# Patient Record
Sex: Female | Born: 1950
Health system: Southern US, Community
[De-identification: ages and names within clinical notes are randomized; demographics above are authoritative.]

## PROBLEM LIST (undated history)

## (undated) DIAGNOSIS — H269 Unspecified cataract: Secondary | ICD-10-CM

## (undated) DIAGNOSIS — I10 Essential (primary) hypertension: Secondary | ICD-10-CM

## (undated) DIAGNOSIS — F419 Anxiety disorder, unspecified: Secondary | ICD-10-CM

## (undated) DIAGNOSIS — E785 Hyperlipidemia, unspecified: Secondary | ICD-10-CM

## (undated) DIAGNOSIS — K219 Gastro-esophageal reflux disease without esophagitis: Secondary | ICD-10-CM

## (undated) HISTORY — PX: FRACTURE SURGERY: SHX138

## (undated) HISTORY — DX: Gastro-esophageal reflux disease without esophagitis: K21.9

## (undated) HISTORY — PX: APPENDECTOMY: SHX54

## (undated) HISTORY — DX: Unspecified cataract: H26.9

## (undated) HISTORY — DX: Anxiety disorder, unspecified: F41.9

---

## 2015-07-30 DIAGNOSIS — Z1321 Encounter for screening for nutritional disorder: Secondary | ICD-10-CM | POA: Diagnosis not present

## 2015-07-30 DIAGNOSIS — Z13220 Encounter for screening for lipoid disorders: Secondary | ICD-10-CM | POA: Diagnosis not present

## 2015-07-30 DIAGNOSIS — Z136 Encounter for screening for cardiovascular disorders: Secondary | ICD-10-CM | POA: Diagnosis not present

## 2015-07-30 DIAGNOSIS — Z1329 Encounter for screening for other suspected endocrine disorder: Secondary | ICD-10-CM | POA: Diagnosis not present

## 2015-07-31 DIAGNOSIS — R7989 Other specified abnormal findings of blood chemistry: Secondary | ICD-10-CM | POA: Diagnosis not present

## 2015-08-14 DIAGNOSIS — R7989 Other specified abnormal findings of blood chemistry: Secondary | ICD-10-CM | POA: Diagnosis not present

## 2015-09-24 DIAGNOSIS — H2513 Age-related nuclear cataract, bilateral: Secondary | ICD-10-CM | POA: Diagnosis not present

## 2015-09-24 DIAGNOSIS — H401131 Primary open-angle glaucoma, bilateral, mild stage: Secondary | ICD-10-CM | POA: Diagnosis not present

## 2015-10-31 DIAGNOSIS — R7989 Other specified abnormal findings of blood chemistry: Secondary | ICD-10-CM | POA: Diagnosis not present

## 2015-12-19 DIAGNOSIS — Z01419 Encounter for gynecological examination (general) (routine) without abnormal findings: Secondary | ICD-10-CM | POA: Diagnosis not present

## 2015-12-19 DIAGNOSIS — F4323 Adjustment disorder with mixed anxiety and depressed mood: Secondary | ICD-10-CM | POA: Diagnosis not present

## 2015-12-19 DIAGNOSIS — Z779 Other contact with and (suspected) exposures hazardous to health: Secondary | ICD-10-CM | POA: Diagnosis not present

## 2016-01-01 DIAGNOSIS — E559 Vitamin D deficiency, unspecified: Secondary | ICD-10-CM | POA: Diagnosis not present

## 2016-01-27 DIAGNOSIS — Z1231 Encounter for screening mammogram for malignant neoplasm of breast: Secondary | ICD-10-CM | POA: Diagnosis not present

## 2016-02-05 DIAGNOSIS — H2513 Age-related nuclear cataract, bilateral: Secondary | ICD-10-CM | POA: Diagnosis not present

## 2016-02-05 DIAGNOSIS — H35039 Hypertensive retinopathy, unspecified eye: Secondary | ICD-10-CM | POA: Diagnosis not present

## 2016-02-05 DIAGNOSIS — H401130 Primary open-angle glaucoma, bilateral, stage unspecified: Secondary | ICD-10-CM | POA: Diagnosis not present

## 2016-05-19 DIAGNOSIS — F4323 Adjustment disorder with mixed anxiety and depressed mood: Secondary | ICD-10-CM | POA: Diagnosis not present

## 2016-07-17 ENCOUNTER — Emergency Department: Payer: PPO

## 2016-07-17 ENCOUNTER — Emergency Department
Admission: EM | Admit: 2016-07-17 | Discharge: 2016-07-17 | Disposition: A | Discharge status: Home / Self Care | Payer: PPO | Attending: Emergency Medicine | Admitting: Emergency Medicine

## 2016-07-17 ENCOUNTER — Encounter: Payer: Self-pay | Admitting: Emergency Medicine

## 2016-07-17 DIAGNOSIS — Y9289 Other specified places as the place of occurrence of the external cause: Secondary | ICD-10-CM | POA: Insufficient documentation

## 2016-07-17 DIAGNOSIS — F17210 Nicotine dependence, cigarettes, uncomplicated: Secondary | ICD-10-CM | POA: Insufficient documentation

## 2016-07-17 DIAGNOSIS — W010XXA Fall on same level from slipping, tripping and stumbling without subsequent striking against object, initial encounter: Secondary | ICD-10-CM | POA: Insufficient documentation

## 2016-07-17 DIAGNOSIS — S82842A Displaced bimalleolar fracture of left lower leg, initial encounter for closed fracture: Secondary | ICD-10-CM

## 2016-07-17 DIAGNOSIS — Y939 Activity, unspecified: Secondary | ICD-10-CM | POA: Insufficient documentation

## 2016-07-17 DIAGNOSIS — S99912A Unspecified injury of left ankle, initial encounter: Secondary | ICD-10-CM | POA: Diagnosis not present

## 2016-07-17 DIAGNOSIS — S8252XA Displaced fracture of medial malleolus of left tibia, initial encounter for closed fracture: Secondary | ICD-10-CM | POA: Diagnosis not present

## 2016-07-17 DIAGNOSIS — Y999 Unspecified external cause status: Secondary | ICD-10-CM | POA: Diagnosis not present

## 2016-07-17 HISTORY — DX: Hyperlipidemia, unspecified: E78.5

## 2016-07-17 MED ORDER — ONDANSETRON 4 MG PO TBDP: 4.0000 mg | ORAL_TABLET | Freq: Three times a day (TID) | ORAL | 0 refills | Status: DC | PRN

## 2016-07-17 MED ORDER — TRAMADOL HCL 50 MG PO TABS
50.0000 mg | ORAL_TABLET | Freq: Once | ORAL | Status: AC
  Administered 2016-07-17: 50 mg via ORAL

## 2016-07-17 MED ORDER — HYDROCODONE-ACETAMINOPHEN 5-325 MG PO TABS: 1.0000 | ORAL_TABLET | Freq: Four times a day (QID) | ORAL | 0 refills | Status: DC | PRN

## 2016-07-17 MED ORDER — TRAMADOL HCL 50 MG PO TABS
ORAL_TABLET | ORAL | Status: AC
  Filled 2016-07-17: qty 1

## 2016-07-17 NOTE — Discharge Instructions (Signed)
Your exam and x-ray reveals a fracture to the ankle. You are being placed in a splint for support. You should get around using the walker and without putting any weight on your broken ankle. Take the pain medicine as needed. Rest with the foot elevated and apply ice packs over the splint. Your last (light) meal should be before 8 am on Monday, May 13th. Call Dr. Elsie StainJames Bower's office to determine the time to report for office visit and surgery. Return to the ED anytime over the weekend for increased pain, swelling, or re-injury.

## 2016-07-17 NOTE — ED Triage Notes (Signed)
Pt states that while standing this evening her right foot tripped up and she fell on the left ankle. Pt denies LOC or head trauma of any kind. Pt is in NAD at this time.

## 2016-07-17 NOTE — ED Notes (Signed)
See triage note, pt reports she slipped in the kitchen and developed left ankle pain. Pt reports not being able to bear weight to her foot. Pulses intact. Pt denies other injuries or LOC.

## 2016-07-17 NOTE — ED Provider Notes (Signed)
[facility] Emergency Department Provider Note ____________________________________________  Time seen: 2037  I have reviewed the triage vital signs and the nursing notes.  HISTORY  Chief Complaint  Ankle Pain  HPI Brandy Parker is a 66 y.o. female Presents to the ED for evaluation of injury and disability to her left ankle.Patient describes standing on her right foot when her left foot slipped on the floor, causing her to land on the outside of her left ankle and foot. Since that time he's had immediate pain, swelling, and disability. Patient admitted to bear weight through her left ankle. She denies any other injury at this time.  Past Medical History:  Diagnosis Date  . Hyperlipemia     There are no active problems to display for this patient.  History reviewed. No pertinent surgical history.  Prior to Admission medications   Medication Sig Start Date End Date Taking? Authorizing Provider  HYDROcodone-acetaminophen (NORCO) 5-325 MG tablet Take 1 tablet by mouth every 6 (six) hours as needed. 07/17/16   Bernyce Brimley, Charlesetta Ivory, PA-C  ondansetron (ZOFRAN ODT) 4 MG disintegrating tablet Take 1 tablet (4 mg total) by mouth every 8 (eight) hours as needed for nausea or vomiting. 07/17/16   Maysen Sudol, Charlesetta Ivory, PA-C    Allergies Sulfa antibiotics  No family history on file.  Social History Social History  Substance Use Topics  . Smoking status: Current Every Day Smoker    Packs/day: 0.50  . Smokeless tobacco: Never Used  . Alcohol use No    Review of Systems  Constitutional: Negative for fever. Cardiovascular: Negative for chest pain. Respiratory: Negative for shortness of breath. Gastrointestinal: Negative for abdominal pain, vomiting and diarrhea. Musculoskeletal: Negative for back pain. Left ankle pain as above. Skin: Negative for rash. Neurological: Negative for headaches, focal weakness or  numbness. ____________________________________________  PHYSICAL EXAM:  VITAL SIGNS: ED Triage Vitals  Enc Vitals Group     BP 07/17/16 1941 (!) 148/57     Pulse Rate 07/17/16 1941 76     Resp 07/17/16 1941 18     Temp 07/17/16 1941 98.5 F (36.9 C)     Temp Source 07/17/16 1941 Oral     SpO2 07/17/16 1941 100 %     Weight 07/17/16 1942 115 lb (52.2 kg)     Height 07/17/16 1942 5\' 2"  (1.575 m)     Head Circumference --      Peak Flow --      Pain Score 07/17/16 1940 8     Pain Loc --      Pain Edu? --      Excl. in GC? --     Constitutional: Alert and oriented. Well appearing and in no distress. Head: Normocephalic and atraumatic. Cardiovascular: Normal rate, regular rhythm. Normal distal pulses. Respiratory: Normal respiratory effort. No wheezes/rales/rhonchi. Musculoskeletal: Left ankle with edema and subtle deformity noted to the lateral malleolus. Patient with normal ankle flexion extension range. No Local tenderness is noted. Nontender with normal range of motion in all extremities.  Neurologic:  Normal gait without ataxia. Normal speech and language. No gross focal neurologic deficits are appreciated. Skin:  Skin is warm, dry and intact. No rash noted. ____________________________________________   RADIOLOGY  Left Ankle IMPRESSION: Fractures of the medial and posterior malleoli with medial subluxation of the distal tibia relative to the talus.  I, Twila Rappa, Charlesetta Ivory, personally viewed and evaluated these images (plain radiographs) as part of my medical decision making, as well as  reviewing the written report by the radiologist. ____________________________________________  PROCEDURES  Ultram 50 mg PO OCL stirrup splint walker ____________________________________________  INITIAL IMPRESSION / ASSESSMENT AND PLAN / ED COURSE   ----------------------------------------- 8:59 PM on 07/17/2016 ----------------------------------------- Spoke with Dr. Cassell SmilesJames  Bowers (ortho). He will have us splint the patient and have her call on Monday morning to set a surgery time. She is to be NPO by 8 am on Monday morning.   Patient with a closed bimalleolar fracture of the left ankle with initial fracture management. She will be referred to Dr. Cassell SmilesJames Bowers who is planning on taking the patient to the OR on Monday for surgical intervention. Patient is discharged with a prescription for hydrocodone and Zofran to dose as directed. She is also provided with a walker to ambulate with nonweightbearing gait. She will rest, ice, elevated the foot and follow-up with ortho as planned. ____________________________________________  FINAL CLINICAL IMPRESSION(S) / ED DIAGNOSES  Final diagnoses:  Bimalleolar ankle fracture, left, closed, initial encounter      Lissa HoardMenshew, Evadna Donaghy V Bacon, PA-C 07/17/16 2152    Emily FilbertWilliams, Jonathan E, MD 07/17/16 2153

## 2016-07-20 ENCOUNTER — Encounter: Payer: Self-pay | Admitting: *Deleted

## 2016-07-20 ENCOUNTER — Ambulatory Visit
Admission: RE | Admit: 2016-07-20 | Discharge: 2016-07-20 | Disposition: A | Discharge status: Home / Self Care | Payer: PPO | Source: Ambulatory Visit | Attending: Orthopedic Surgery | Admitting: Orthopedic Surgery

## 2016-07-20 ENCOUNTER — Ambulatory Visit: Payer: PPO | Admitting: Anesthesiology

## 2016-07-20 ENCOUNTER — Encounter
Admission: RE | Disposition: A | Discharge status: Home / Self Care | Payer: Self-pay | Source: Ambulatory Visit | Attending: Orthopedic Surgery

## 2016-07-20 DIAGNOSIS — S82832A Other fracture of upper and lower end of left fibula, initial encounter for closed fracture: Secondary | ICD-10-CM | POA: Insufficient documentation

## 2016-07-20 DIAGNOSIS — M25572 Pain in left ankle and joints of left foot: Secondary | ICD-10-CM | POA: Diagnosis not present

## 2016-07-20 DIAGNOSIS — X58XXXA Exposure to other specified factors, initial encounter: Secondary | ICD-10-CM | POA: Diagnosis not present

## 2016-07-20 DIAGNOSIS — F17210 Nicotine dependence, cigarettes, uncomplicated: Secondary | ICD-10-CM | POA: Diagnosis not present

## 2016-07-20 DIAGNOSIS — I10 Essential (primary) hypertension: Secondary | ICD-10-CM | POA: Insufficient documentation

## 2016-07-20 DIAGNOSIS — S8262XA Displaced fracture of lateral malleolus of left fibula, initial encounter for closed fracture: Secondary | ICD-10-CM | POA: Diagnosis not present

## 2016-07-20 DIAGNOSIS — S8292XA Unspecified fracture of left lower leg, initial encounter for closed fracture: Secondary | ICD-10-CM | POA: Diagnosis not present

## 2016-07-20 DIAGNOSIS — G8918 Other acute postprocedural pain: Secondary | ICD-10-CM | POA: Diagnosis not present

## 2016-07-20 HISTORY — PX: ORIF ANKLE FRACTURE: SHX5408

## 2016-07-20 SURGERY — OPEN REDUCTION INTERNAL FIXATION (ORIF) ANKLE FRACTURE
Anesthesia: General | Laterality: Left | Wound class: Clean

## 2016-07-20 MED ORDER — ONDANSETRON HCL 4 MG/2ML IJ SOLN
INTRAMUSCULAR | Status: DC | PRN
  Administered 2016-07-20: 4 mg via INTRAVENOUS

## 2016-07-20 MED ORDER — CEFAZOLIN SODIUM-DEXTROSE 2-4 GM/100ML-% IV SOLN
INTRAVENOUS | Status: AC
  Filled 2016-07-20: qty 100

## 2016-07-20 MED ORDER — FENTANYL CITRATE (PF) 100 MCG/2ML IJ SOLN
INTRAMUSCULAR | Status: AC
  Filled 2016-07-20: qty 2

## 2016-07-20 MED ORDER — CEFAZOLIN (ANCEF) 1 G IV SOLR
2.0000 g | INTRAVENOUS | Status: AC
  Administered 2016-07-20: 2 g

## 2016-07-20 MED ORDER — LIDOCAINE HCL (PF) 1 % IJ SOLN
INTRAMUSCULAR | Status: AC
  Filled 2016-07-20: qty 5

## 2016-07-20 MED ORDER — PHENYLEPHRINE HCL 10 MG/ML IJ SOLN
INTRAMUSCULAR | Status: DC | PRN
  Administered 2016-07-20: 50 ug via INTRAVENOUS
  Administered 2016-07-20 (×3): 100 ug via INTRAVENOUS

## 2016-07-20 MED ORDER — MIDAZOLAM HCL 2 MG/2ML IJ SOLN: INTRAMUSCULAR | Status: DC | PRN

## 2016-07-20 MED ORDER — ONDANSETRON HCL 4 MG/2ML IJ SOLN: 4.0000 mg | Freq: Once | INTRAMUSCULAR | Status: DC | PRN

## 2016-07-20 MED ORDER — SENNOSIDES-DOCUSATE SODIUM 8.6-50 MG PO TABS: 2.0000 | ORAL_TABLET | Freq: Two times a day (BID) | ORAL | 0 refills | Status: AC

## 2016-07-20 MED ORDER — SODIUM CHLORIDE 0.9 % IJ SOLN
INTRAMUSCULAR | Status: AC
  Filled 2016-07-20: qty 50

## 2016-07-20 MED ORDER — LACTATED RINGERS IV SOLN
INTRAVENOUS | Status: DC
  Administered 2016-07-20: 13:00:00 via INTRAVENOUS

## 2016-07-20 MED ORDER — ACETAMINOPHEN 10 MG/ML IV SOLN
INTRAVENOUS | Status: AC
  Filled 2016-07-20: qty 100

## 2016-07-20 MED ORDER — BACITRACIN 50000 UNITS IM SOLR
INTRAMUSCULAR | Status: AC
  Filled 2016-07-20: qty 1

## 2016-07-20 MED ORDER — LIDOCAINE HCL (CARDIAC) 20 MG/ML IV SOLN
INTRAVENOUS | Status: DC | PRN
  Administered 2016-07-20: 80 mg via INTRAVENOUS

## 2016-07-20 MED ORDER — FAMOTIDINE 20 MG PO TABS
ORAL_TABLET | ORAL | Status: AC
  Administered 2016-07-20: 20 mg via ORAL
  Filled 2016-07-20: qty 1

## 2016-07-20 MED ORDER — MIDAZOLAM HCL 2 MG/2ML IJ SOLN
1.0000 mg | Freq: Once | INTRAMUSCULAR | Status: AC
  Administered 2016-07-20: 1 mg via INTRAVENOUS

## 2016-07-20 MED ORDER — KETOROLAC TROMETHAMINE 30 MG/ML IJ SOLN
INTRAMUSCULAR | Status: AC
  Filled 2016-07-20: qty 1

## 2016-07-20 MED ORDER — PROPOFOL 10 MG/ML IV BOLUS
INTRAVENOUS | Status: DC | PRN
  Administered 2016-07-20: 130 mg via INTRAVENOUS

## 2016-07-20 MED ORDER — DEXAMETHASONE SODIUM PHOSPHATE 10 MG/ML IJ SOLN
INTRAMUSCULAR | Status: AC
  Filled 2016-07-20: qty 1

## 2016-07-20 MED ORDER — BUPIVACAINE HCL (PF) 0.5 % IJ SOLN
INTRAMUSCULAR | Status: AC
  Filled 2016-07-20: qty 30

## 2016-07-20 MED ORDER — MIDAZOLAM HCL 2 MG/2ML IJ SOLN
INTRAMUSCULAR | Status: DC | PRN
  Administered 2016-07-20: 1 mg via INTRAVENOUS

## 2016-07-20 MED ORDER — LIDOCAINE HCL (PF) 2 % IJ SOLN
INTRAMUSCULAR | Status: AC
  Filled 2016-07-20: qty 2

## 2016-07-20 MED ORDER — MIDAZOLAM HCL 2 MG/2ML IJ SOLN
INTRAMUSCULAR | Status: AC
  Filled 2016-07-20: qty 2

## 2016-07-20 MED ORDER — FAMOTIDINE 20 MG PO TABS
20.0000 mg | ORAL_TABLET | Freq: Once | ORAL | Status: AC
  Administered 2016-07-20: 20 mg via ORAL

## 2016-07-20 MED ORDER — ROPIVACAINE HCL 5 MG/ML IJ SOLN
INTRAMUSCULAR | Status: AC
  Filled 2016-07-20: qty 30

## 2016-07-20 MED ORDER — FENTANYL CITRATE (PF) 100 MCG/2ML IJ SOLN: 25.0000 ug | INTRAMUSCULAR | Status: DC | PRN

## 2016-07-20 MED ORDER — MIDAZOLAM HCL 2 MG/2ML IJ SOLN
INTRAMUSCULAR | Status: AC
  Administered 2016-07-20: 1 mg via INTRAVENOUS
  Filled 2016-07-20: qty 2

## 2016-07-20 MED ORDER — DEXAMETHASONE SODIUM PHOSPHATE 10 MG/ML IJ SOLN
INTRAMUSCULAR | Status: DC | PRN
  Administered 2016-07-20: 8 mg via INTRAVENOUS

## 2016-07-20 MED ORDER — ONDANSETRON HCL 4 MG/2ML IJ SOLN
INTRAMUSCULAR | Status: AC
  Filled 2016-07-20: qty 2

## 2016-07-20 MED ORDER — FENTANYL CITRATE (PF) 100 MCG/2ML IJ SOLN
INTRAMUSCULAR | Status: DC | PRN
  Administered 2016-07-20: 25 ug via INTRAVENOUS
  Administered 2016-07-20: 50 ug via INTRAVENOUS
  Administered 2016-07-20: 25 ug via INTRAVENOUS

## 2016-07-20 MED ORDER — PROPOFOL 10 MG/ML IV BOLUS
INTRAVENOUS | Status: AC
  Filled 2016-07-20: qty 20

## 2016-07-20 SURGICAL SUPPLY — 48 items
BANDAGE ELASTIC 6 CLIP ST LF (GAUZE/BANDAGES/DRESSINGS) ×3 IMPLANT
BIT DRILL 2.5X110 QC LCP DISP (BIT) ×3 IMPLANT
BIT DRILL QC 3.5X110 (BIT) ×3 IMPLANT
BLADE SURG 10 STRL SS SAFETY (BLADE) ×3 IMPLANT
BLADE SURG 15 STRL SS SAFETY (BLADE) ×9 IMPLANT
BNDG COHESIVE 6X5 TAN STRL LF (GAUZE/BANDAGES/DRESSINGS) IMPLANT
BNDG ESMARK 6X12 TAN STRL LF (GAUZE/BANDAGES/DRESSINGS) ×3 IMPLANT
BRUSH SCRUB EZ  4% CHG (MISCELLANEOUS) ×2
BRUSH SCRUB EZ 4% CHG (MISCELLANEOUS) ×1 IMPLANT
CANISTER SUCT 1200ML W/VALVE (MISCELLANEOUS) ×3 IMPLANT
CHLORAPREP W/TINT 26ML (MISCELLANEOUS) ×3 IMPLANT
DRAPE FLUOR MINI C-ARM 54X84 (DRAPES) ×3 IMPLANT
DRAPE SHEET LG 3/4 BI-LAMINATE (DRAPES) ×3 IMPLANT
ELECT REM PT RETURN 9FT ADLT (ELECTROSURGICAL) ×3
ELECTRODE REM PT RTRN 9FT ADLT (ELECTROSURGICAL) ×1 IMPLANT
GAUZE PETRO XEROFOAM 1X8 (MISCELLANEOUS) ×3 IMPLANT
GLOVE INDICATOR 8.0 STRL GRN (GLOVE) ×9 IMPLANT
GLOVE SURG ORTHO 8.0 STRL STRW (GLOVE) ×6 IMPLANT
GOWN STRL REUS W/ TWL LRG LVL3 (GOWN DISPOSABLE) ×1 IMPLANT
GOWN STRL REUS W/ TWL XL LVL3 (GOWN DISPOSABLE) ×1 IMPLANT
GOWN STRL REUS W/TWL LRG LVL3 (GOWN DISPOSABLE) ×2
GOWN STRL REUS W/TWL XL LVL3 (GOWN DISPOSABLE) ×2
KIT RM TURNOVER STRD PROC AR (KITS) ×3 IMPLANT
NS IRRIG 1000ML POUR BTL (IV SOLUTION) ×3 IMPLANT
PACK EXTREMITY ARMC (MISCELLANEOUS) ×3 IMPLANT
PAD ABD DERMACEA PRESS 5X9 (GAUZE/BANDAGES/DRESSINGS) ×6 IMPLANT
PAD CAST CTTN 4X4 STRL (SOFTGOODS) ×2 IMPLANT
PADDING CAST COTTON 4X4 STRL (SOFTGOODS) ×4
PLATE LCP 3.5 1/3 TUB 7HX81 (Plate) ×3 IMPLANT
SCREW LOCK T15 FT 12X3.5X2.9X (Screw) ×2 IMPLANT
SCREW LOCK T15 FT 14X3.5X2.9X (Screw) ×1 IMPLANT
SCREW LOCK T15 FT 16X3.5X2.9X (Screw) ×2 IMPLANT
SCREW LOCK T15 FT 20X3.5XST (Screw) ×1 IMPLANT
SCREW LOCK T15 FT 24X3.5X2.9X (Screw) IMPLANT
SCREW LOCKING 3.5X12 (Screw) ×4 IMPLANT
SCREW LOCKING 3.5X14 (Screw) ×2 IMPLANT
SCREW LOCKING 3.5X16 (Screw) ×4 IMPLANT
SCREW LOCKING 3.5X20 (Screw) ×2 IMPLANT
SCREW LOCKING 3.5X24 (Screw) IMPLANT
SPLINT CAST 1 STEP 5X30 WHT (MISCELLANEOUS) ×3 IMPLANT
STAPLER SKIN PROX 35W (STAPLE) ×3 IMPLANT
SUT VIC AB 2-0 SH 27 (SUTURE) ×2
SUT VIC AB 2-0 SH 27XBRD (SUTURE) ×1 IMPLANT
SUT VIC AB 3-0 SH 27 (SUTURE) ×2
SUT VIC AB 3-0 SH 27X BRD (SUTURE) ×1 IMPLANT
TAPE SURG TRANSPORE 1 IN (GAUZE/BANDAGES/DRESSINGS) IMPLANT
TAPE SURGICAL TRANSPORE 1 IN (GAUZE/BANDAGES/DRESSINGS)
TOWEL OR 17X26 4PK STRL BLUE (TOWEL DISPOSABLE) ×3 IMPLANT

## 2016-07-20 NOTE — Anesthesia Procedure Notes (Signed)
Procedure Name: LMA Insertion Date/Time: 07/20/2016 3:55 PM Performed by: Henrietta HooverPOPE, Courteney Alderete Pre-anesthesia Checklist: Patient identified, Emergency Drugs available, Suction available, Patient being monitored and Timeout performed Patient Re-evaluated:Patient Re-evaluated prior to inductionOxygen Delivery Method: Circle system utilized Preoxygenation: Pre-oxygenation with 100% oxygen Intubation Type: IV induction Ventilation: Mask ventilation without difficulty LMA: LMA inserted LMA Size: 4.0 Number of attempts: 1 Placement Confirmation: positive ETCO2 Tube secured with: Tape Dental Injury: Teeth and Oropharynx as per pre-operative assessment

## 2016-07-20 NOTE — Anesthesia Procedure Notes (Signed)
Anesthesia Regional Block: Popliteal block   Pre-Anesthetic Checklist: ,, timeout performed, Correct Patient, Correct Site, Correct Laterality, Correct Procedure, Correct Position, site marked, Risks and benefits discussed,  Surgical consent,  Pre-op evaluation,  At surgeon's request and post-op pain management  Laterality: Lower and Left  Prep: chloraprep       Needles:  Injection technique: Single-shot  Needle Type: Echogenic Stimulator Needle     Needle Length: 10cm  Needle Gauge: 20     Additional Needles:   Procedures: ultrasound guided,,,,,,,,  Narrative:  Injection made incrementally with aspirations every 5 mL.  Performed by: Personally  Anesthesiologist: Caedon Bond, Currie ParisJAMES G  Additional Notes: Functioning IV was confirmed and monitors were applied. A time out was performed and site identified.  An echogenic needle was used. Sterile prep,hand hygiene and sterile gloves were used. Minimal sedation used for procedure.   No paresthesia endorsed by patient during the procedure.  Negative aspiration and negative test dose prior to incremental administration of local anesthetic. The patient tolerated the procedure well with no immediate complications.

## 2016-07-20 NOTE — Anesthesia Preprocedure Evaluation (Signed)
Anesthesia Evaluation  Patient identified by MRN, date of birth, ID band Patient awake    Reviewed: Allergy & Precautions, H&P , NPO status , Patient's Chart, lab work & pertinent test results, reviewed documented beta blocker date and time   Airway Mallampati: II  TM Distance: >3 FB Neck ROM: full    Dental no notable dental hx. (+) Teeth Intact   Pulmonary neg pulmonary ROS, Current Smoker,    Pulmonary exam normal breath sounds clear to auscultation       Cardiovascular Exercise Tolerance: Good hypertension, negative cardio ROS Normal cardiovascular exam Rhythm:regular Rate:Normal     Neuro/Psych negative neurological ROS  negative psych ROS   GI/Hepatic negative GI ROS, Neg liver ROS,   Endo/Other  negative endocrine ROSdiabetes  Renal/GU negative Renal ROS  negative genitourinary   Musculoskeletal   Abdominal   Peds  Hematology negative hematology ROS (+)   Anesthesia Other Findings   Reproductive/Obstetrics negative OB ROS                             Anesthesia Physical Anesthesia Plan  ASA: II  Anesthesia Plan: General LMA   Post-op Pain Management:  Regional for Post-op pain   Induction:   Airway Management Planned:   Additional Equipment:   Intra-op Plan:   Post-operative Plan:   Informed Consent: I have reviewed the patients History and Physical, chart, labs and discussed the procedure including the risks, benefits and alternatives for the proposed anesthesia with the patient or authorized representative who has indicated his/her understanding and acceptance.     Plan Discussed with: CRNA  Anesthesia Plan Comments:         Anesthesia Quick Evaluation

## 2016-07-20 NOTE — Anesthesia Postprocedure Evaluation (Signed)
Anesthesia Post Note  Patient: Ane PaymentDebra G Ulbrich  Procedure(s) Performed: Procedure(s) (LRB): OPEN REDUCTION INTERNAL FIXATION (ORIF) ANKLE FRACTURE (Left)  Patient location during evaluation: PACU Anesthesia Type: General Level of consciousness: awake and alert and oriented Pain management: pain level controlled Vital Signs Assessment: post-procedure vital signs reviewed and stable Respiratory status: spontaneous breathing, nonlabored ventilation and respiratory function stable Cardiovascular status: blood pressure returned to baseline and stable Postop Assessment: no signs of nausea or vomiting Anesthetic complications: no     Last Vitals:  Vitals:   07/20/16 1703 07/20/16 1718  BP: (!) 145/71 132/82  Pulse: 84 74  Resp: 11 11  Temp: 36.2 C     Last Pain:  Vitals:   07/20/16 1703  TempSrc:   PainSc: Asleep                 Vinh Sachs

## 2016-07-20 NOTE — Anesthesia Post-op Follow-up Note (Cosign Needed)
Anesthesia QCDR form completed.        

## 2016-07-20 NOTE — H&P (Signed)
The patient has been re-examined, and the chart reviewed, and there have been no interval changes to the documented history and physical.  Plan a left ankle ORIF today. ° °Anesthesia is consulted regarding a peripheral nerve block for post-operative pain. ° °The risks, benefits, and alternatives have been discussed at length, and the patient is willing to proceed.   ° °

## 2016-07-20 NOTE — Transfer of Care (Signed)
Immediate Anesthesia Transfer of Care Note  Patient: Ane PaymentDebra G Longstreth  Procedure(s) Performed: Procedure(s): OPEN REDUCTION INTERNAL FIXATION (ORIF) ANKLE FRACTURE (Left)  Patient Location: PACU  Anesthesia Type:General  Level of Consciousness: sedated  Airway & Oxygen Therapy: Patient Spontanous Breathing and Patient connected to face mask oxygen  Post-op Assessment: Report given to RN and Post -op Vital signs reviewed and stable  Post vital signs: Reviewed and stable  Last Vitals:  Vitals:   07/20/16 1450 07/20/16 1505  BP: (!) 141/71 (!) 149/71  Pulse: 86 93  Resp: 13 15  Temp:      Last Pain:  Vitals:   07/20/16 1505  TempSrc:   PainSc: 0-No pain         Complications: No apparent anesthesia complications

## 2016-07-20 NOTE — Discharge Instructions (Addendum)
Ankle Fracture A fracture is a break in a bone. A cast or splint may be used to protect the ankle and heal the break. Sometimes, surgery is needed. Follow these instructions at home:  Use crutches as told by your doctor. It is very important that you use your crutches correctly.  Do not put weight or pressure on the injured ankle until told by your doctor.  Keep your ankle raised (elevated) when sitting or lying down.  Apply ice to the ankle:  Put ice in a plastic bag.  Place a towel between your cast and the bag.  Leave the ice on for 20 minutes, 2-3 times a day.  If you have a plaster or fiberglass cast:  Do not try to scratch under the cast with any objects.  Check the skin around the cast every day. You may put lotion on red or sore areas.  Keep your cast dry and clean.  If you have a plaster splint:  Wear the splint as told by your doctor.  You can loosen the elastic around the splint if your toes get numb, tingle, or turn cold or blue.  Do not put pressure on any part of your cast or splint. It may break. Rest your plaster splint or cast only on a pillow the first 24 hours until it is fully hardened.  Cover your cast or splint with a plastic bag during showers.  Do not lower your cast or splint into water.  Take medicine as told by your doctor.  Do not drive until your doctor says it is safe.  Follow-up with your doctor as told. It is very important that you go to your follow-up visits. Contact a doctor if: The swelling and discomfort gets worse. Get help right away if:  Your splint or cast breaks.  You continue to have very bad pain.  You have new pain or swelling after your splint or cast was put on.  Your skin or toes below the injured ankle:  Turn blue or gray.  Feel cold, numb, or you cannot feel them.  There is a bad smell or yellowish white fluid (pus) coming from under the splint or cast. This information is not intended to replace advice  given to you by your health care provider. Make sure you discuss any questions you have with your health care provider. Document Released: 12/21/2008 Document Revised: 08/01/2015 Document Reviewed: 09/22/2012 Elsevier Interactive Patient Education  2017 [employer]   AMBULATORY SURGERY  DISCHARGE INSTRUCTIONS   1) The drugs that you were given will stay in your system until tomorrow so for the next 24 hours you should not:  A) Drive an automobile B) Make any legal decisions C) Drink any alcoholic beverage   2) You may resume regular meals tomorrow.  Today it is better to start with liquids and gradually work up to solid foods.  You may eat anything you prefer, but it is better to start with liquids, then soup and crackers, and gradually work up to solid foods.   3) Please notify your doctor immediately if you have any unusual bleeding, trouble breathing, redness and pain at the surgery site, drainage, fever, or pain not relieved by medication.    4) Additional Instructions:   Please contact your physician with any problems or Same Day Surgery at [phone], Monday through Friday 6 am to 4 pm, or [place] at [facility] Main number at [phone].

## 2016-07-20 NOTE — Op Note (Signed)
07/20/2016  PATIENT:  Brandy Parker    PRE-OPERATIVE DIAGNOSIS:  LEFT ANKLE FRACTURE  POST-OPERATIVE DIAGNOSIS:  Same  PROCEDURE:  OPEN REDUCTION INTERNAL FIXATION (ORIF) ANKLE FRACTURE  SURGEON:  Lyndle HerrlichJames R Mancel Lardizabal, MD  ANESTHESIA:   General  TOURNIQUET TIME: 27  MIN  PREOPERATIVE INDICATIONS:  Ane PaymentDebra G Cieslewicz is a  66 y.o. female with a diagnosis of LEFT ANKLE FRACTURE who elected for surgical management to minimize the risk for malunion and nonunion and post-traumatic arthritis.    The risks benefits and alternatives were discussed with the patient preoperatively including but not limited to the risks of infection, bleeding, nerve injury, cardiopulmonary complications, the need for revision surgery, the need for hardware removal, among others, and the patient was willing to proceed.  OPERATIVE IMPLANTS: Synthes 1/3 tubular plate and interfragmentary screw  OPERATIVE PROCEDURE: The patient was brought to the operating room and placed in the supine position. All bony prominences were padded. General anesthesia was administered. The lower extremity was prepped and draped in the usual sterile fashion. The leg was elevated and exsanguinated and the tourniquet was inflated. Time out was performed.   Incision was made over the distal fibula and the fracture was exposed and reduced anatomically with a clamp. A lag screw was placed. I then applied a 1/3 tubular plate and secured it with cortical screws.  I used the Fluoroscan to confirm satisfactory reduction and fixation. This wound was irrigated and closed with vicryl sutures and staples.  The syndesmosis was stressed using live fluoroscopy and found to be stable.   Sponge and needle counts were correct. The wounds were injected with local anesthetic. Sterile gauze was applied followed by a posterior splint. She was awakened and returned to the PACU in stable and satisfactory condition. There were no apparent complications.  Dola ArgyleJames R. Odis LusterBowers, MD

## 2016-07-21 ENCOUNTER — Encounter: Payer: Self-pay | Admitting: Orthopedic Surgery

## 2016-07-29 DIAGNOSIS — S8262XD Displaced fracture of lateral malleolus of left fibula, subsequent encounter for closed fracture with routine healing: Secondary | ICD-10-CM | POA: Diagnosis not present

## 2016-08-31 DIAGNOSIS — S8262XD Displaced fracture of lateral malleolus of left fibula, subsequent encounter for closed fracture with routine healing: Secondary | ICD-10-CM | POA: Diagnosis not present

## 2016-12-29 DIAGNOSIS — Z131 Encounter for screening for diabetes mellitus: Secondary | ICD-10-CM | POA: Diagnosis not present

## 2016-12-29 DIAGNOSIS — Z136 Encounter for screening for cardiovascular disorders: Secondary | ICD-10-CM | POA: Diagnosis not present

## 2016-12-29 DIAGNOSIS — Z1321 Encounter for screening for nutritional disorder: Secondary | ICD-10-CM | POA: Diagnosis not present

## 2016-12-29 DIAGNOSIS — Z1329 Encounter for screening for other suspected endocrine disorder: Secondary | ICD-10-CM | POA: Diagnosis not present

## 2016-12-29 DIAGNOSIS — Z13220 Encounter for screening for lipoid disorders: Secondary | ICD-10-CM | POA: Diagnosis not present

## 2017-02-01 DIAGNOSIS — M85852 Other specified disorders of bone density and structure, left thigh: Secondary | ICD-10-CM | POA: Diagnosis not present

## 2017-02-01 DIAGNOSIS — M8588 Other specified disorders of bone density and structure, other site: Secondary | ICD-10-CM | POA: Diagnosis not present

## 2017-02-01 DIAGNOSIS — Z1231 Encounter for screening mammogram for malignant neoplasm of breast: Secondary | ICD-10-CM | POA: Diagnosis not present

## 2017-02-01 DIAGNOSIS — M8589 Other specified disorders of bone density and structure, multiple sites: Secondary | ICD-10-CM | POA: Diagnosis not present

## 2017-02-01 DIAGNOSIS — Z79890 Hormone replacement therapy (postmenopausal): Secondary | ICD-10-CM | POA: Diagnosis not present

## 2017-02-01 DIAGNOSIS — Z780 Asymptomatic menopausal state: Secondary | ICD-10-CM | POA: Diagnosis not present

## 2017-02-01 DIAGNOSIS — Z13820 Encounter for screening for osteoporosis: Secondary | ICD-10-CM | POA: Diagnosis not present

## 2017-04-26 DIAGNOSIS — H2513 Age-related nuclear cataract, bilateral: Secondary | ICD-10-CM | POA: Diagnosis not present

## 2017-04-26 DIAGNOSIS — H401130 Primary open-angle glaucoma, bilateral, stage unspecified: Secondary | ICD-10-CM | POA: Diagnosis not present

## 2017-04-26 DIAGNOSIS — H35039 Hypertensive retinopathy, unspecified eye: Secondary | ICD-10-CM | POA: Diagnosis not present

## 2017-06-29 DIAGNOSIS — F4323 Adjustment disorder with mixed anxiety and depressed mood: Secondary | ICD-10-CM | POA: Diagnosis not present

## 2017-11-24 DIAGNOSIS — H401131 Primary open-angle glaucoma, bilateral, mild stage: Secondary | ICD-10-CM | POA: Diagnosis not present

## 2017-12-30 DIAGNOSIS — Z131 Encounter for screening for diabetes mellitus: Secondary | ICD-10-CM | POA: Diagnosis not present

## 2017-12-30 DIAGNOSIS — Z1321 Encounter for screening for nutritional disorder: Secondary | ICD-10-CM | POA: Diagnosis not present

## 2017-12-30 DIAGNOSIS — F4323 Adjustment disorder with mixed anxiety and depressed mood: Secondary | ICD-10-CM | POA: Diagnosis not present

## 2017-12-30 DIAGNOSIS — Z13220 Encounter for screening for lipoid disorders: Secondary | ICD-10-CM | POA: Diagnosis not present

## 2017-12-30 DIAGNOSIS — Z136 Encounter for screening for cardiovascular disorders: Secondary | ICD-10-CM | POA: Diagnosis not present

## 2017-12-30 DIAGNOSIS — Z01419 Encounter for gynecological examination (general) (routine) without abnormal findings: Secondary | ICD-10-CM | POA: Diagnosis not present

## 2017-12-30 DIAGNOSIS — Z1329 Encounter for screening for other suspected endocrine disorder: Secondary | ICD-10-CM | POA: Diagnosis not present

## 2018-02-11 DIAGNOSIS — Z1231 Encounter for screening mammogram for malignant neoplasm of breast: Secondary | ICD-10-CM | POA: Diagnosis not present

## 2018-03-03 ENCOUNTER — Other Ambulatory Visit: Payer: Self-pay | Admitting: Obstetrics and Gynecology

## 2018-03-03 DIAGNOSIS — N6489 Other specified disorders of breast: Secondary | ICD-10-CM

## 2018-03-17 ENCOUNTER — Ambulatory Visit
Admission: RE | Admit: 2018-03-17 | Discharge: 2018-03-17 | Disposition: A | Discharge status: Home / Self Care | Payer: PPO | Source: Ambulatory Visit | Attending: Obstetrics and Gynecology | Admitting: Obstetrics and Gynecology

## 2018-03-17 DIAGNOSIS — N6489 Other specified disorders of breast: Secondary | ICD-10-CM | POA: Insufficient documentation

## 2018-03-17 DIAGNOSIS — R922 Inconclusive mammogram: Secondary | ICD-10-CM | POA: Diagnosis not present

## 2018-05-25 DIAGNOSIS — H40019 Open angle with borderline findings, low risk, unspecified eye: Secondary | ICD-10-CM | POA: Diagnosis not present

## 2018-05-25 DIAGNOSIS — H2513 Age-related nuclear cataract, bilateral: Secondary | ICD-10-CM | POA: Diagnosis not present

## 2018-05-25 DIAGNOSIS — H401131 Primary open-angle glaucoma, bilateral, mild stage: Secondary | ICD-10-CM | POA: Diagnosis not present

## 2018-05-25 DIAGNOSIS — H35039 Hypertensive retinopathy, unspecified eye: Secondary | ICD-10-CM | POA: Diagnosis not present

## 2019-02-04 ENCOUNTER — Encounter: Payer: Self-pay | Admitting: Nurse Practitioner

## 2019-02-04 DIAGNOSIS — H2513 Age-related nuclear cataract, bilateral: Secondary | ICD-10-CM | POA: Insufficient documentation

## 2019-02-04 DIAGNOSIS — H401131 Primary open-angle glaucoma, bilateral, mild stage: Secondary | ICD-10-CM | POA: Insufficient documentation

## 2019-02-09 ENCOUNTER — Ambulatory Visit (INDEPENDENT_AMBULATORY_CARE_PROVIDER_SITE_OTHER): Payer: PPO | Admitting: Nurse Practitioner

## 2019-02-09 ENCOUNTER — Encounter: Payer: Self-pay | Admitting: Nurse Practitioner

## 2019-02-09 ENCOUNTER — Other Ambulatory Visit: Payer: Self-pay

## 2019-02-09 VITALS — Temp 98.5°F

## 2019-02-09 DIAGNOSIS — Z7689 Persons encountering health services in other specified circumstances: Secondary | ICD-10-CM | POA: Diagnosis not present

## 2019-02-09 DIAGNOSIS — F411 Generalized anxiety disorder: Secondary | ICD-10-CM

## 2019-02-09 DIAGNOSIS — F339 Major depressive disorder, recurrent, unspecified: Secondary | ICD-10-CM | POA: Insufficient documentation

## 2019-02-09 DIAGNOSIS — E785 Hyperlipidemia, unspecified: Secondary | ICD-10-CM

## 2019-02-09 DIAGNOSIS — Z1231 Encounter for screening mammogram for malignant neoplasm of breast: Secondary | ICD-10-CM

## 2019-02-09 NOTE — Patient Instructions (Signed)

## 2019-02-09 NOTE — Progress Notes (Signed)
New Patient Office Visit  Subjective:  Patient ID: Brandy Parker, female    DOB: 10-30-1950  Age: 68 y.o. MRN: [id number]  CC:  Chief Complaint  Patient presents with  . Establish Care    . This visit was completed via telephone due to the restrictions of the COVID-19 pandemic. All issues as above were discussed and addressed but no physical exam was performed. If it was felt that the patient should be evaluated in the office, they were directed there. The patient verbally consented to this visit. Patient was unable to complete an audio/visual visit due to Technical difficulties,Lack of internet. Due to the catastrophic nature of the COVID-19 pandemic, this visit was done through audio contact only. . Location of the patient: home . Location of the provider: work . Those involved with this call:  . Provider: Aura DialsJolene Chalisa Kobler, DNP . CMA: Wilhemena DurieBrittany Russell, CMA . Front Desk/Registration: Harriet PhoJoliza Johnson  . Time spent on call: 30 minutes on the phone discussing health concerns. 10 minutes total spent in review of patient's record and preparation of their chart.  . I verified patient identity using two factors (patient name and date of birth). Patient consents verbally to being seen via telemedicine visit today.    HPI Brandy Parker presents for new patient visit to establish care.  Introduced to Publishing rights managernurse practitioner role and practice setting.  All questions answered.  Was followed by Dr. Toya SmothersKnowles-Jonas at [facility] for PCP and then recently went for physical and office was closed.  Due for mammogram, got letter that she is due for this.  She does not recall having DEXA.  ANXIETY/STRESS Current medications include Xanax 0.5 MG TID, has only ever taken this at night and often takes 1/2 tablet, and Lexapro 10 MG daily.  Has been on Xanax for a long while per her report, several years.  Has always been ordered TID, but she has never used it this way.  Pt is aware of risks of benzo  medication use to include increased sedation, respiratory suppression, falls, extrapyramidal movements,  dependence and cardiovascular events.  Pt would like to continue treatment as benefit determined to outweigh risk.   Duration:stable Anxious mood: no  Excessive worrying: no Irritability: no  Sweating: no Nausea: no Palpitations:no Hyperventilation: no Panic attacks: no Agoraphobia: no  Obscessions/compulsions: no Depressed mood: no Depression screen PHQ 2/9 02/09/2019  Decreased Interest 0  Down, Depressed, Hopeless 0  PHQ - 2 Score 0  Altered sleeping 0  Tired, decreased energy 0  Change in appetite 0  Feeling bad or failure about yourself  0  Trouble concentrating 0  Moving slowly or fidgety/restless 0  Suicidal thoughts 0  PHQ-9 Score 0  Difficult doing work/chores Not difficult at all   Anhedonia: none Weight changes: no Insomnia: yes hard to fall asleep  Hypersomnia: no Fatigue/loss of energy: no Feelings of worthlessness: no Feelings of guilt: no Impaired concentration/indecisiveness: no Suicidal ideations: no  Crying spells: no Recent Stressors/Life Changes: no   Relationship problems: no   Family stress: no     Financial stress: no    Job stress: no    Recent death/loss: no  HYPERLIPIDEMIA Currently taking Simvastatin 20 MG daily. Hyperlipidemia status: good compliance Satisfied with current treatment?  yes Side effects:  no Medication compliance: good compliance Past cholesterol meds: simvastatin (zocor) Supplements: none Aspirin:  no The ASCVD Risk score Denman George(Goff DC Jr., et al., 2013) failed to calculate for the following reasons:   The  systolic blood pressure is missing   Cannot find a previous HDL lab   Cannot find a previous total cholesterol lab Chest pain:  no Coronary artery disease:  no Family history CAD:  no Family history early CAD:  no  Past Medical History:  Diagnosis Date  . Anxiety   . Hyperlipemia     Past Surgical History:   Procedure Laterality Date  . ORIF ANKLE FRACTURE Left 07/20/2016   Procedure: OPEN REDUCTION INTERNAL FIXATION (ORIF) ANKLE FRACTURE;  Surgeon: Lovell Sheehan, MD;  Location: [facility] ORS;  Service: Orthopedics;  Laterality: Left;    Family History  Problem Relation Age of Onset  . Hypertension Mother   . Heart failure Mother   . Hypertension Father   . Hyperlipidemia Father   . Cancer Father        bladder  . Heart failure Father   . Cancer Sister        bladder    Social History   Socioeconomic History  . Marital status: Single    Spouse name: Not on file  . Number of children: Not on file  . Years of education: Not on file  . Highest education level: Not on file  Occupational History  . Not on file  Social Needs  . Financial resource strain: Not hard at all  . Food insecurity    Worry: Never true    Inability: Never true  . Transportation needs    Medical: No    Non-medical: No  Tobacco Use  . Smoking status: Current Every Day Smoker    Packs/day: 0.25  . Smokeless tobacco: Never Used  Substance and Sexual Activity  . Alcohol use: No  . Drug use: No  . Sexual activity: Not on file  Lifestyle  . Physical activity    Days per week: 3 days    Minutes per session: 30 min  . Stress: Only a little  Relationships  . Social Herbalist on phone: Three times a week    Gets together: Three times a week    Attends religious service: Never    Active member of club or organization: No    Attends meetings of clubs or organizations: Never    Relationship status: Not on file  . Intimate partner violence    Fear of current or ex partner: No    Emotionally abused: No    Physically abused: No    Forced sexual activity: No  Other Topics Concern  . Not on file  Social History Narrative  . Not on file    ROS Review of Systems  Constitutional: Negative for activity change, appetite change, diaphoresis, fatigue and fever.  Respiratory: Negative for cough, chest  tightness and shortness of breath.   Cardiovascular: Negative for chest pain, palpitations and leg swelling.  Gastrointestinal: Negative for abdominal distention, abdominal pain, constipation, diarrhea, nausea and vomiting.  Neurological: Negative for dizziness, syncope, weakness, light-headedness, numbness and headaches.  Psychiatric/Behavioral: Negative.     Objective:   Today's Vitals: Temp 98.5 F (36.9 C) (Oral)   Physical Exam   Unable to perform due to telephone only visit.  Assessment & Plan:   Problem List Items Addressed This Visit      Other   Generalized anxiety disorder    Chronic, stable.  Only taking Xanax 0.5 MG (at times 1/2 of this) at night for anxiety and rest.  Had discussion about BEERS criteria and risks with these.  Will change script  in chart to match her current use.  Is not taking TID.  Will continue to monitor use and obtain UDS + controlled substance contract at physical in 4 weeks.      Relevant Medications   escitalopram (LEXAPRO) 10 MG tablet   ALPRAZolam (XANAX) 0.5 MG tablet   Depression, recurrent (HCC)    Chronic, stable on Lexapro.  Denies SI/HI.  Continue current regimen and send refills upon request.  Return in 4 weeks for physical exam.      Relevant Medications   escitalopram (LEXAPRO) 10 MG tablet   ALPRAZolam (XANAX) 0.5 MG tablet   Hyperlipidemia    Chronic, ongoing.  Continue current medication regimen and adjust as needed.  Plan on CMP and lipid panel at physical in 4 weeks.       Other Visit Diagnoses    Encounter to establish care    -  Primary   Encounter for screening mammogram for malignant neoplasm of breast       Relevant Orders   MM DIGITAL SCREENING BILATERAL      Outpatient Encounter Medications as of 02/09/2019  Medication Sig  . ALPRAZolam (XANAX) 0.5 MG tablet Take 0.5 mg by mouth at bedtime as needed.  Marland Kitchen escitalopram (LEXAPRO) 10 MG tablet Take 1 tablet by mouth daily.  . simvastatin (ZOCOR) 20 MG tablet  Take 20 mg by mouth daily.  . [DISCONTINUED] ALPRAZolam (XANAX) 0.5 MG tablet Take 1 tablet by mouth 3 (three) times daily as needed.  . [DISCONTINUED] HYDROcodone-acetaminophen (NORCO) 5-325 MG tablet Take 1 tablet by mouth every 6 (six) hours as needed.   No facility-administered encounter medications on file as of 02/09/2019.    I discussed the assessment and treatment plan with the patient. The patient was provided an opportunity to ask questions and all were answered. The patient agreed with the plan and demonstrated an understanding of the instructions.   The patient was advised to call back or seek an in-person evaluation if the symptoms worsen or if the condition fails to improve as anticipated.   I provided 30 minutes of time during this encounter.  Follow-up: Return in about 4 weeks (around 03/09/2019) for Annual physical.   Marjie Skiff, NP

## 2019-02-09 NOTE — Assessment & Plan Note (Signed)
Chronic, stable on Lexapro.  Denies SI/HI.  Continue current regimen and send refills upon request.  Return in 4 weeks for physical exam.

## 2019-02-09 NOTE — Assessment & Plan Note (Signed)
Chronic, ongoing.  Continue current medication regimen and adjust as needed.  Plan on CMP and lipid panel at physical in 4 weeks.

## 2019-02-09 NOTE — Assessment & Plan Note (Addendum)
Chronic, stable.  Only taking Xanax 0.5 MG (at times 1/2 of this) at night for anxiety and rest.  Had discussion about BEERS criteria and risks with these.  Will change script in chart to match her current use.  Is not taking TID.  Will continue to monitor use and obtain UDS + controlled substance contract at physical in 4 weeks.

## 2019-02-13 ENCOUNTER — Telehealth: Payer: Self-pay | Admitting: Obstetrics and Gynecology

## 2019-02-13 ENCOUNTER — Encounter: Payer: Self-pay | Admitting: Obstetrics and Gynecology

## 2019-02-13 NOTE — Telephone Encounter (Signed)
Called pt no answer, sent letter.  °

## 2019-02-22 DIAGNOSIS — H401131 Primary open-angle glaucoma, bilateral, mild stage: Secondary | ICD-10-CM | POA: Diagnosis not present

## 2019-02-22 DIAGNOSIS — H2510 Age-related nuclear cataract, unspecified eye: Secondary | ICD-10-CM | POA: Diagnosis not present

## 2019-02-22 DIAGNOSIS — H35039 Hypertensive retinopathy, unspecified eye: Secondary | ICD-10-CM | POA: Diagnosis not present

## 2019-03-16 ENCOUNTER — Encounter: Payer: PPO | Admitting: Nurse Practitioner

## 2019-03-31 ENCOUNTER — Ambulatory Visit (INDEPENDENT_AMBULATORY_CARE_PROVIDER_SITE_OTHER): Payer: PPO | Admitting: Nurse Practitioner

## 2019-03-31 ENCOUNTER — Encounter: Payer: Self-pay | Admitting: Nurse Practitioner

## 2019-03-31 ENCOUNTER — Other Ambulatory Visit: Payer: Self-pay

## 2019-03-31 VITALS — BP 138/78 | HR 75 | Temp 99.1°F | Ht 61.5 in | Wt 110.0 lb

## 2019-03-31 DIAGNOSIS — E782 Mixed hyperlipidemia: Secondary | ICD-10-CM | POA: Diagnosis not present

## 2019-03-31 DIAGNOSIS — Z79899 Other long term (current) drug therapy: Secondary | ICD-10-CM | POA: Diagnosis not present

## 2019-03-31 DIAGNOSIS — F411 Generalized anxiety disorder: Secondary | ICD-10-CM

## 2019-03-31 DIAGNOSIS — E569 Vitamin deficiency, unspecified: Secondary | ICD-10-CM

## 2019-03-31 DIAGNOSIS — Z23 Encounter for immunization: Secondary | ICD-10-CM

## 2019-03-31 DIAGNOSIS — Z0000 Encounter for general adult medical examination without abnormal findings: Secondary | ICD-10-CM | POA: Diagnosis not present

## 2019-03-31 DIAGNOSIS — F339 Major depressive disorder, recurrent, unspecified: Secondary | ICD-10-CM | POA: Diagnosis not present

## 2019-03-31 MED ORDER — ESCITALOPRAM OXALATE 10 MG PO TABS: 10.0000 mg | ORAL_TABLET | Freq: Every day | ORAL | 3 refills | Status: DC

## 2019-03-31 MED ORDER — SIMVASTATIN 20 MG PO TABS: 20.0000 mg | ORAL_TABLET | Freq: Every day | ORAL | 3 refills | Status: DC

## 2019-03-31 NOTE — Assessment & Plan Note (Signed)
Chronic, stable on Lexapro.  Denies SI/HI. Continue current medication regimen and adjust as needed.  Refills sent.  Return in 3 months for visit.

## 2019-03-31 NOTE — Patient Instructions (Signed)
Pneumococcal Conjugate Vaccine (PCV13): What You Need to Know 1. Why get vaccinated? Pneumococcal conjugate vaccine (PCV13) can prevent pneumococcal disease. Pneumococcal disease refers to any illness caused by pneumococcal bacteria. These bacteria can cause many types of illnesses, including pneumonia, which is an infection of the lungs. Pneumococcal bacteria are one of the most common causes of pneumonia. Besides pneumonia, pneumococcal bacteria can also cause:  Ear infections  Sinus infections  Meningitis (infection of the tissue covering the brain and spinal cord)  Bacteremia (bloodstream infection) Anyone can get pneumococcal disease, but children under 2 years of age, people with certain medical conditions, adults 65 years or older, and cigarette smokers are at the highest risk. Most pneumococcal infections are mild. However, some can result in long-term problems, such as brain damage or hearing loss. Meningitis, bacteremia, and pneumonia caused by pneumococcal disease can be fatal. 2. PCV13 PCV13 protects against 13 types of bacteria that cause pneumococcal disease. Infants and young children usually need 4 doses of pneumococcal conjugate vaccine, at 2, 4, 6, and 12-15 months of age. In some cases, a child might need fewer than 4 doses to complete PCV13 vaccination. A dose of PCV23 vaccine is also recommended for anyone 2 years or older with certain medical conditions if they did not already receive PCV13. This vaccine may be given to adults 65 years or older based on discussions between the patient and health care provider. 3. Talk with your health care provider Tell your vaccine provider if the person getting the vaccine:  Has had an allergic reaction after a previous dose of PCV13, to an earlier pneumococcal conjugate vaccine known as PCV7, or to any vaccine containing diphtheria toxoid (for example, DTaP), or has any severe, life-threatening allergies.  In some cases, your health  care provider may decide to postpone PCV13 vaccination to a future visit. People with minor illnesses, such as a cold, may be vaccinated. People who are moderately or severely ill should usually wait until they recover before getting PCV13. Your health care provider can give you more information. 4. Risks of a vaccine reaction  Redness, swelling, pain, or tenderness where the shot is given, and fever, loss of appetite, fussiness (irritability), feeling tired, headache, and chills can happen after PCV13. Young children may be at increased risk for seizures caused by fever after PCV13 if it is administered at the same time as inactivated influenza vaccine. Ask your health care provider for more information. People sometimes faint after medical procedures, including vaccination. Tell your provider if you feel dizzy or have vision changes or ringing in the ears. As with any medicine, there is a very remote chance of a vaccine causing a severe allergic reaction, other serious injury, or death. 5. What if there is a serious problem? An allergic reaction could occur after the vaccinated person leaves the clinic. If you see signs of a severe allergic reaction (hives, swelling of the face and throat, difficulty breathing, a fast heartbeat, dizziness, or weakness), call 9-1-1 and get the person to the nearest hospital. For other signs that concern you, call your health care provider. Adverse reactions should be reported to the Vaccine Adverse Event Reporting System (VAERS). Your health care provider will usually file this report, or you can do it yourself. Visit the VAERS website at www.vaers.hhs.gov or call 1-800-822-7967. VAERS is only for reporting reactions, and VAERS staff do not give medical advice. 6. The National Vaccine Injury Compensation Program The National Vaccine Injury Compensation Program (VICP) is a federal program   that was created to compensate people who may have been injured by certain  vaccines. Visit the VICP website at www.hrsa.gov/vaccinecompensation or call 1-800-338-2382 to learn about the program and about filing a claim. There is a time limit to file a claim for compensation. 7. How can I learn more?  Ask your health care provider.  Call your local or state health department.  Contact the Centers for Disease Control and Prevention (CDC): ? Call 1-800-232-4636 (1-800-CDC-INFO) or ? Visit CDC's website at www.cdc.gov/vaccines Vaccine Information Statement PCV13 Vaccine (01/05/2018) This information is not intended to replace advice given to you by your health care provider. Make sure you discuss any questions you have with your health care provider. Document Revised: 06/14/2018 Document Reviewed: 10/05/2017 Elsevier Patient Education  2020 [employer]vier Inc.   

## 2019-03-31 NOTE — Assessment & Plan Note (Signed)
Chronic, stable.  Only taking Xanax 0.5 MG (at times 1/2 of this) at night for anxiety and rest.  Had discussion about BEERS criteria and risks with these.   Will continue to monitor use and obtain UDS + controlled substance contract today.  She agrees to every 3 month visits, will return in 3 months.

## 2019-03-31 NOTE — Progress Notes (Addendum)
BP 138/78 (BP Location: Right Arm, Cuff Size: Normal)   Pulse 75   Temp 99.1 F (37.3 C) (Oral)   Ht 5' 1.5" (1.562 m)   Wt 110 lb (49.9 kg)   SpO2 96%   BMI 20.45 kg/m    Subjective:    Patient ID: Brandy Parker, female    DOB: [birth year], 69 y.o.   MRN: [id number]  HPI: Brandy Parker is a 69 y.o. female presenting on 03/31/2019 for comprehensive medical examination. Current medical complaints include:none  She currently lives with: self Menopausal Symptoms: no   ANXIETY/STRESS Current medications include Xanax 0.5 MG, she takes only 1/2 tablet at night, and Lexapro 10 MG daily.  Has been on Xanax for a long while per her report, several years (2008).  Has always been ordered TID, but she has never used it this way.  Pt is aware of risks of benzo medication use to include increased sedation, respiratory suppression, falls, extrapyramidal movements,  dependence and cardiovascular events.  Pt would like to continue treatment as benefit determined to outweigh risk.   Duration:stable Anxious mood: no  Excessive worrying: no Irritability: no  Sweating: no Nausea: no Palpitations:no Hyperventilation: no Panic attacks: no Agoraphobia: no  Obscessions/compulsions: no Depressed mood: no Depression screen [facility] 2/9 03/31/2019 02/09/2019  Decreased Interest 0 0  Down, Depressed, Hopeless 0 0  PHQ - 2 Score 0 0  Altered sleeping 0 0  Tired, decreased energy 1 0  Change in appetite 0 0  Feeling bad or failure about yourself  0 0  Trouble concentrating 0 0  Moving slowly or fidgety/restless 0 0  Suicidal thoughts 0 0  PHQ-9 Score 1 0  Difficult doing work/chores Not difficult at all Not difficult at all   Anhedonia: none Weight changes: no Insomnia: yes hard to fall asleep  Hypersomnia: no Fatigue/loss of energy: no Feelings of worthlessness: no Feelings of guilt: no Impaired concentration/indecisiveness: no Suicidal ideations: no  Crying spells: no Recent Stressors/Life  Changes: no   Relationship problems: no   Family stress: no     Financial stress: no    Job stress: no    Recent death/loss: no  GAD 7 : Generalized Anxiety Score 03/31/2019 02/09/2019  Nervous, Anxious, on Edge 1 1  Control/stop worrying 0 0  Worry too much - different things 0 0  Trouble relaxing 1 0  Restless 0 1  Easily annoyed or irritable 0 1  Afraid - awful might happen 0 0  Total GAD 7 Score 2 3  Anxiety Difficulty Not difficult at all Not difficult at all    HYPERLIPIDEMIA Currently taking Simvastatin 20 MG daily.  She is a current smoker, smokes about 5-6 cigarettes a day.  Started smoking in 20's.  Not interested in quitting.   Hyperlipidemia status: good compliance Satisfied with current treatment?  yes Side effects:  no Medication compliance: good compliance Past cholesterol meds: simvastatin (zocor) Supplements: none Aspirin:  no The ASCVD Risk score Denman George DC Jr., et al., 2013) failed to calculate for the following reasons:   The systolic blood pressure is missing   Cannot find a previous HDL lab   Cannot find a previous total cholesterol lab Chest pain:  no Coronary artery disease:  no Family history CAD:  no Family history early CAD:  no  The patient does not have a history of falls. I did not complete a risk assessment for falls. A plan of care for falls was not documented.  Past Medical History:  Past Medical History:  Diagnosis Date  . Anxiety   . Hyperlipemia     Surgical History:  Past Surgical History:  Procedure Laterality Date  . ORIF ANKLE FRACTURE Left 07/20/2016   Procedure: OPEN REDUCTION INTERNAL FIXATION (ORIF) ANKLE FRACTURE;  Surgeon: Lovell Sheehan, MD;  Location: [facility] ORS;  Service: Orthopedics;  Laterality: Left;    Medications:  Current Outpatient Medications on File Prior to Visit  Medication Sig  . ALPRAZolam (XANAX) 0.5 MG tablet Take 0.5 mg by mouth at bedtime as needed.   No current facility-administered medications on  file prior to visit.    Allergies:  Allergies  Allergen Reactions  . Sulfa Antibiotics Other (See Comments)    Per patient, unknown reaction    Social History:  Social History   Socioeconomic History  . Marital status: Single    Spouse name: Not on file  . Number of children: Not on file  . Years of education: Not on file  . Highest education level: Not on file  Occupational History  . Not on file  Tobacco Use  . Smoking status: Current Every Day Smoker    Packs/day: 0.25  . Smokeless tobacco: Never Used  Substance and Sexual Activity  . Alcohol use: No  . Drug use: No  . Sexual activity: Not on file  Other Topics Concern  . Not on file  Social History Narrative  . Not on file   Social Determinants of Health   Financial Resource Strain: Low Risk   . Difficulty of Paying Living Expenses: Not hard at all  Food Insecurity: No Food Insecurity  . Worried About Charity fundraiser in the Last Year: Never true  . Ran Out of Food in the Last Year: Never true  Transportation Needs: No Transportation Needs  . Lack of Transportation (Medical): No  . Lack of Transportation (Non-Medical): No  Physical Activity: Insufficiently Active  . Days of Exercise per Week: 3 days  . Minutes of Exercise per Session: 30 min  Stress: No Stress Concern Present  . Feeling of Stress : Only a little  Social Connections: Unknown  . Frequency of Communication with Friends and Family: Three times a week  . Frequency of Social Gatherings with Friends and Family: Three times a week  . Attends Religious Services: Never  . Active Member of Clubs or Organizations: No  . Attends Archivist Meetings: Never  . Marital Status: Not on file  Intimate Partner Violence: Not At Risk  . Fear of Current or Ex-Partner: No  . Emotionally Abused: No  . Physically Abused: No  . Sexually Abused: No   Social History   Tobacco Use  Smoking Status Current Every Day Smoker  . Packs/day: 0.25   Smokeless Tobacco Never Used   Social History   Substance and Sexual Activity  Alcohol Use No    Family History:  Family History  Problem Relation Age of Onset  . Hypertension Mother   . Heart failure Mother   . Hypertension Father   . Hyperlipidemia Father   . Cancer Father        bladder  . Heart failure Father   . Cancer Sister        bladder    Past medical history, surgical history, medications, allergies, family history and social history reviewed with patient today and changes made to appropriate areas of the chart.   Review of Systems - negative All other ROS negative except  what is listed above and in the HPI.      Objective:    BP 138/78 (BP Location: Right Arm, Cuff Size: Normal)   Pulse 75   Temp 99.1 F (37.3 C) (Oral)   Ht 5' 1.5" (1.562 m)   Wt 110 lb (49.9 kg)   SpO2 96%   BMI 20.45 kg/m   Wt Readings from Last 3 Encounters:  03/31/19 110 lb (49.9 kg)  07/20/16 115 lb (52.2 kg)  07/17/16 115 lb (52.2 kg)    Physical Exam Constitutional:      General: She is awake. She is not in acute distress.    Appearance: She is well-developed. She is not ill-appearing.  HENT:     Head: Normocephalic and atraumatic.     Right Ear: Hearing, tympanic membrane, ear canal and external ear normal. No drainage.     Left Ear: Hearing, tympanic membrane, ear canal and external ear normal. No drainage.     Nose: Nose normal.     Right Sinus: No maxillary sinus tenderness or frontal sinus tenderness.     Left Sinus: No maxillary sinus tenderness or frontal sinus tenderness.     Mouth/Throat:     Mouth: Mucous membranes are moist.     Pharynx: Oropharynx is clear. Uvula midline. No pharyngeal swelling, oropharyngeal exudate or posterior oropharyngeal erythema.  Eyes:     General: Lids are normal.        Right eye: No discharge.        Left eye: No discharge.     Extraocular Movements: Extraocular movements intact.     Conjunctiva/sclera: Conjunctivae normal.      Pupils: Pupils are equal, round, and reactive to light.     Visual Fields: Right eye visual fields normal and left eye visual fields normal.  Neck:     Thyroid: No thyromegaly.     Vascular: No carotid bruit.     Trachea: Trachea normal.  Cardiovascular:     Rate and Rhythm: Normal rate and regular rhythm.     Heart sounds: Normal heart sounds. No murmur. No gallop.   Pulmonary:     Effort: Pulmonary effort is normal. No accessory muscle usage or respiratory distress.     Breath sounds: Normal breath sounds.  Chest:     Breasts:        Right: Normal.        Left: Normal.  Abdominal:     General: Bowel sounds are normal.     Palpations: Abdomen is soft. There is no hepatomegaly or splenomegaly.     Tenderness: There is no abdominal tenderness.  Musculoskeletal:        General: Normal range of motion.     Cervical back: Normal range of motion and neck supple.     Right lower leg: No edema.     Left lower leg: No edema.  Lymphadenopathy:     Head:     Right side of head: No submental, submandibular, tonsillar, preauricular or posterior auricular adenopathy.     Left side of head: No submental, submandibular, tonsillar, preauricular or posterior auricular adenopathy.     Cervical: No cervical adenopathy.     Upper Body:     Right upper body: No supraclavicular, axillary or pectoral adenopathy.     Left upper body: No supraclavicular, axillary or pectoral adenopathy.  Skin:    General: Skin is warm and dry.     Capillary Refill: Capillary refill takes less than 2 seconds.  Findings: No rash.  Neurological:     Mental Status: She is alert and oriented to person, place, and time.     Cranial Nerves: Cranial nerves are intact.     Gait: Gait is intact.     Deep Tendon Reflexes: Reflexes are normal and symmetric.     Reflex Scores:      Brachioradialis reflexes are 2+ on the right side and 2+ on the left side.      Patellar reflexes are 2+ on the right side and 2+ on the left  side. Psychiatric:        Attention and Perception: Attention normal.        Mood and Affect: Mood normal.        Speech: Speech normal.        Behavior: Behavior normal. Behavior is cooperative.        Thought Content: Thought content normal.        Judgment: Judgment normal.     No results found for this or any previous visit.    Assessment & Plan:   Problem List Items Addressed This Visit      Other   Generalized anxiety disorder    Chronic, stable.  Only taking Xanax 0.5 MG (at times 1/2 of this) at night for anxiety and rest.  Had discussion about BEERS criteria and risks with these.   Will continue to monitor use and obtain UDS + controlled substance contract today.  She agrees to every 3 month visits, will return in 3 months.      Relevant Medications   escitalopram (LEXAPRO) 10 MG tablet   Other Relevant Orders   TSH   Urine drugs of abuse scrn w alc, routine (Ref Lab)   Depression, recurrent (HCC)    Chronic, stable on Lexapro.  Denies SI/HI. Continue current medication regimen and adjust as needed.  Refills sent.  Return in 3 months for visit.      Relevant Medications   escitalopram (LEXAPRO) 10 MG tablet   Hyperlipidemia    Chronic, ongoing.  Continue current medication regimen and adjust as needed.  Plan on CMP and lipid panel today.  Refills sent.      Relevant Medications   simvastatin (ZOCOR) 20 MG tablet   Other Relevant Orders   Comprehensive metabolic panel   Lipid Panel w/o Chol/HDL Ratio   Long-term current use of benzodiazepine    Since 2008, Xanax.  Discussed BEERS criteria and risks today, she wishes to continue current regimen and agrees with every 3 month visits.  UDS and controlled substance contract today.      Relevant Orders   Urine drugs of abuse scrn w alc, routine (Ref Lab)   Controlled substance agreement signed    Signed on 03/31/2019       Other Visit Diagnoses    Annual physical exam    -  Primary   Annual labs today to  include CBC, CMP, TSH, lipid   Relevant Orders   CBC with Differential/Platelet   TSH   Vitamin deficiency       Reports history of low level, recheck Vit D today.   Relevant Orders   VITAMIN D 25 Hydroxy (Vit-D Deficiency, Fractures)       Follow up plan: Return in about 3 months (around 06/29/2019) for Anxiety.   LABORATORY TESTING:  - Pap smear: not applicable  IMMUNIZATIONS:   - Tdap: Tetanus vaccination status reviewed: last tetanus booster within 10 years, refused, next visit. -  Influenza: Up to date - Pneumovax: Not applicable - Prevnar: Up to date - HPV: Not applicable - Zostavax vaccine: Refused  SCREENING: -Mammogram: scheduled for 04/14/2019  - Colonoscopy: Refused  - Bone Density: Ordered today  -Hearing Test: Not applicable  -Spirometry: Not applicable   PATIENT COUNSELING:   Advised to take 1 mg of folate supplement per day if capable of pregnancy.   Sexuality: Discussed sexually transmitted diseases, partner selection, use of condoms, avoidance of unintended pregnancy  and contraceptive alternatives.   Advised to avoid cigarette smoking.  I discussed with the patient that most people either abstain from alcohol or drink within safe limits (<=14/week and <=4 drinks/occasion for males, <=7/weeks and <= 3 drinks/occasion for females) and that the risk for alcohol disorders and other health effects rises proportionally with the number of drinks per week and how often a drinker exceeds daily limits.  Discussed cessation/primary prevention of drug use and availability of treatment for abuse.   Diet: Encouraged to adjust caloric intake to maintain  or achieve ideal body weight, to reduce intake of dietary saturated fat and total fat, to limit sodium intake by avoiding high sodium foods and not adding table salt, and to maintain adequate dietary potassium and calcium preferably from fresh fruits, vegetables, and low-fat dairy products.    stressed the importance of  regular exercise  Injury prevention: Discussed safety belts, safety helmets, smoke detector, smoking near bedding or upholstery.   Dental health: Discussed importance of regular tooth brushing, flossing, and dental visits.    NEXT PREVENTATIVE PHYSICAL DUE IN 1 YEAR. Return in about 3 months (around 06/29/2019) for Anxiety.

## 2019-03-31 NOTE — Addendum Note (Signed)
Addended by: Pablo Ledger on: 03/31/2019 05:14 PM   Modules accepted: Orders

## 2019-03-31 NOTE — Assessment & Plan Note (Addendum)
Chronic, ongoing.  Continue current medication regimen and adjust as needed.  Plan on CMP and lipid panel today.  Refills sent.

## 2019-03-31 NOTE — Assessment & Plan Note (Signed)
Since 2008, Xanax.  Discussed BEERS criteria and risks today, she wishes to continue current regimen and agrees with every 3 month visits.  UDS and controlled substance contract today.

## 2019-03-31 NOTE — Assessment & Plan Note (Signed)
Signed on 03/31/2019

## 2019-04-01 LAB — COMPREHENSIVE METABOLIC PANEL
ALT: 13 IU/L (ref 0–32)
AST: 23 IU/L (ref 0–40)
Albumin/Globulin Ratio: 2.3 — ABNORMAL HIGH (ref 1.2–2.2)
Albumin: 4.6 g/dL (ref 3.8–4.8)
Alkaline Phosphatase: 66 IU/L (ref 39–117)
BUN/Creatinine Ratio: 18 (ref 12–28)
BUN: 15 mg/dL (ref 8–27)
Bilirubin Total: <0.2 mg/dL (ref 0.0–1.2)
CO2: 28 mmol/L (ref 20–29)
Calcium: 9.7 mg/dL (ref 8.7–10.3)
Chloride: 104 mmol/L (ref 96–106)
Creatinine, Ser: 0.82 mg/dL (ref 0.57–1.00)
GFR calc Af Amer: 84 mL/min/{1.73_m2} (ref >59)
GFR calc non Af Amer: 73 mL/min/{1.73_m2} (ref >59)
Globulin, Total: 2 g/dL (ref 1.5–4.5)
Glucose: 68 mg/dL (ref 65–99)
Potassium: 5 mmol/L (ref 3.5–5.2)
Sodium: 144 mmol/L (ref 134–144)
Total Protein: 6.6 g/dL (ref 6.0–8.5)

## 2019-04-01 LAB — LIPID PANEL W/O CHOL/HDL RATIO
Cholesterol, Total: 165 mg/dL (ref 100–199)
HDL: 82 mg/dL (ref >39)
LDL Chol Calc (NIH): 69 mg/dL (ref 0–99)
Triglycerides: 72 mg/dL (ref 0–149)
VLDL Cholesterol Cal: 14 mg/dL (ref 5–40)

## 2019-04-01 LAB — CBC WITH DIFFERENTIAL/PLATELET
Basophils Absolute: 0.1 10*3/uL (ref 0.0–0.2)
Basos: 1 %
EOS (ABSOLUTE): 0.1 10*3/uL (ref 0.0–0.4)
Eos: 1 %
Hematocrit: 38.6 % (ref 34.0–46.6)
Hemoglobin: 12.8 g/dL (ref 11.1–15.9)
Immature Grans (Abs): 0 10*3/uL (ref 0.0–0.1)
Immature Granulocytes: 0 %
Lymphocytes Absolute: 3.1 10*3/uL (ref 0.7–3.1)
Lymphs: 50 %
MCH: 31.8 pg (ref 26.6–33.0)
MCHC: 33.2 g/dL (ref 31.5–35.7)
MCV: 96 fL (ref 79–97)
Monocytes Absolute: 0.5 10*3/uL (ref 0.1–0.9)
Monocytes: 7 %
Neutrophils Absolute: 2.6 10*3/uL (ref 1.4–7.0)
Neutrophils: 41 %
Platelets: 328 10*3/uL (ref 150–450)
RBC: 4.03 x10E6/uL (ref 3.77–5.28)
RDW: 11.9 % (ref 11.7–15.4)
WBC: 6.2 10*3/uL (ref 3.4–10.8)

## 2019-04-01 LAB — VITAMIN D 25 HYDROXY (VIT D DEFICIENCY, FRACTURES): Vit D, 25-Hydroxy: 60.2 ng/mL (ref 30.0–100.0)

## 2019-04-01 LAB — TSH: TSH: 0.853 u[IU]/mL (ref 0.450–4.500)

## 2019-04-02 NOTE — Progress Notes (Signed)
Please let Shima know labs returned normal, continue current medication regimen.  IF any questions or concerns let me know.  Overall, labs look great!!  Keep up the good work!!

## 2019-04-03 LAB — URINE DRUGS OF ABUSE SCREEN W ALC, ROUTINE (REF LAB)

## 2019-04-14 ENCOUNTER — Ambulatory Visit
Admission: RE | Admit: 2019-04-14 | Discharge: 2019-04-14 | Disposition: A | Discharge status: Home / Self Care | Payer: PPO | Source: Ambulatory Visit | Attending: Nurse Practitioner | Admitting: Nurse Practitioner

## 2019-04-14 DIAGNOSIS — Z1231 Encounter for screening mammogram for malignant neoplasm of breast: Secondary | ICD-10-CM | POA: Diagnosis not present

## 2019-04-14 NOTE — Progress Notes (Signed)
Contacted via MyChart

## 2019-05-18 ENCOUNTER — Ambulatory Visit (INDEPENDENT_AMBULATORY_CARE_PROVIDER_SITE_OTHER): Payer: PPO

## 2019-05-18 DIAGNOSIS — Z0000 Encounter for general adult medical examination without abnormal findings: Secondary | ICD-10-CM | POA: Diagnosis not present

## 2019-05-18 NOTE — Progress Notes (Signed)
Subjective:   Brandy Parker is a 69 y.o. female who presents for an Initial Medicare Annual Wellness Visit.  This visit is being conducted via phone call  - after an attmept to do on video chat - due to the COVID-19 pandemic. This patient has given me verbal consent via phone to conduct this visit, patient states they are participating from their home address. Some vital signs may be absent or patient reported.   Patient identification: identified by name, DOB, and current address.    Review of Systems     Cardiac Risk Factors include: advanced age (>37men, >10 women)     Objective:    There were no vitals filed for this visit. There is no height or weight on file to calculate BMI.  Advanced Directives 05/18/2019 07/20/2016 07/17/2016  Does Patient Have a Medical Advance Directive? No No No  Would patient like information on creating a medical advance directive? - No - Patient declined No - Patient declined    Current Medications (verified) Outpatient Encounter Medications as of 05/18/2019  Medication Sig  . ALPRAZolam (XANAX) 0.5 MG tablet Take 0.5 mg by mouth at bedtime as needed.  Marland Kitchen escitalopram (LEXAPRO) 10 MG tablet Take 1 tablet (10 mg total) by mouth daily.  . simvastatin (ZOCOR) 20 MG tablet Take 1 tablet (20 mg total) by mouth daily at 6 PM.   No facility-administered encounter medications on file as of 05/18/2019.    Allergies (verified) Sulfa antibiotics   History: Past Medical History:  Diagnosis Date  . Anxiety   . Hyperlipemia    Past Surgical History:  Procedure Laterality Date  . ORIF ANKLE FRACTURE Left 07/20/2016   Procedure: OPEN REDUCTION INTERNAL FIXATION (ORIF) ANKLE FRACTURE;  Surgeon: Lovell Sheehan, MD;  Location: [facility] ORS;  Service: Orthopedics;  Laterality: Left;   Family History  Problem Relation Age of Onset  . Hypertension Mother   . Heart failure Mother   . Hypertension Father   . Hyperlipidemia Father   . Cancer Father    bladder  . Heart failure Father   . Cancer Sister        bladder  . Breast cancer Neg Hx    Social History   Socioeconomic History  . Marital status: Single    Spouse name: Not on file  . Number of children: Not on file  . Years of education: Not on file  . Highest education level: Not on file  Occupational History  . Occupation: fulltime     Employer: [employer]  Tobacco Use  . Smoking status: Current Every Day Smoker    Packs/day: 0.25    Types: Cigarettes  . Smokeless tobacco: Never Used  Substance and Sexual Activity  . Alcohol use: No  . Drug use: No  . Sexual activity: Not on file  Other Topics Concern  . Not on file  Social History Narrative  . Not on file   Social Determinants of Health   Financial Resource Strain: Low Risk   . Difficulty of Paying Living Expenses: Not hard at all  Food Insecurity: No Food Insecurity  . Worried About Charity fundraiser in the Last Year: Never true  . Ran Out of Food in the Last Year: Never true  Transportation Needs: No Transportation Needs  . Lack of Transportation (Medical): No  . Lack of Transportation (Non-Medical): No  Physical Activity: Insufficiently Active  . Days of Exercise per Week: 3 days  . Minutes of Exercise per  Session: 30 min  Stress: No Stress Concern Present  . Feeling of Stress : Only a little  Social Connections: Unknown  . Frequency of Communication with Friends and Family: Three times a week  . Frequency of Social Gatherings with Friends and Family: Three times a week  . Attends Religious Services: Never  . Active Member of Clubs or Organizations: No  . Attends Banker Meetings: Never  . Marital Status: Not on file    Tobacco Counseling Ready to quit: No Counseling given: Yes   Clinical Intake:                        Activities of Daily Living In your present state of health, do you have any difficulty performing the following activities: 05/18/2019 03/31/2019    Hearing? N N  Comment no hearing aids -  Vision? Y Y  Comment eyeglasses, catracts, Dr.Woodard -  Difficulty concentrating or making decisions? N N  Walking or climbing stairs? N N  Dressing or bathing? N N  Doing errands, shopping? N N  Preparing Food and eating ? N -  Using the Toilet? N -  In the past six months, have you accidently leaked urine? N -  Do you have problems with loss of bowel control? N -  Managing your Medications? N -  Managing your Finances? N -  Housekeeping or managing your Housekeeping? N -  Some recent data might be hidden     Immunizations and Health Maintenance Immunization History  Administered Date(s) Administered  . Influenza,inj,quad, With Preservative 01/02/2019  . Pneumococcal Conjugate-13 03/31/2019   Health Maintenance Due  Topic Date Due  . URINE MICROALBUMIN  Never done  . DEXA SCAN  Never done    Patient Care Team: Marjie Skiff, NP as PCP - General (Nurse Practitioner)  Indicate any recent Medical Services you may have received from other than [facility] providers in the past year (date may be approximate).     Assessment:   This is a routine wellness examination for Brandy Parker.  Hearing/Vision screen  Hearing Screening   125Hz  250Hz  500Hz  1000Hz  2000Hz  3000Hz  4000Hz  6000Hz  8000Hz   Right ear:           Left ear:           Vision Screening Comments: Dr.Woodard   Dietary issues and exercise activities discussed: Current Exercise Habits: Home exercise routine, Type of exercise: walking, Time (Minutes): 10, Frequency (Times/Week): 5, Weekly Exercise (Minutes/Week): 50, Intensity: Mild, Exercise limited by: None identified  Goals Addressed   None    Depression Screen PHQ 2/9 Scores 05/18/2019 03/31/2019 02/09/2019  PHQ - 2 Score 0 0 0  PHQ- 9 Score - 1 0    Fall Risk Fall Risk  05/18/2019 03/31/2019 02/09/2019  Falls in the past year? 0 0 0  Number falls in past yr: 0 0 0  Injury with Fall? 0 0 0  Follow up - Falls evaluation  completed Falls evaluation completed   FALL RISK PREVENTION PERTAINING TO THE HOME:  Any stairs in or around the home? Yes  2 steps going in home If so, are there any without handrails? No   Home free of loose throw rugs in walkways, pet beds, electrical cords, etc? Yes  Adequate lighting in your home to reduce risk of falls? Yes   ASSISTIVE DEVICES UTILIZED TO PREVENT FALLS:  Life alert? No  Use of a cane, walker or w/c? No  Grab bars  in the bathroom? No  Shower chair or bench in shower? No  Elevated toilet seat or a handicapped toilet? No    DME ORDERS:  DME order needed?  No   TIMED UP AND GO:  Unable to perform    Cognitive Function:        Screening Tests Health Maintenance  Topic Date Due  . URINE MICROALBUMIN  Never done  . DEXA SCAN  Never done  . TETANUS/TDAP  02/09/2020 (Originally 02/05/1970)  . COLONOSCOPY  03/30/2020 (Originally 02/05/1999)  . Hepatitis C Screening  03/30/2020 (Originally 1950/12/18)  . PNA vac Low Risk Adult (2 of 2 - PPSV23) 03/30/2020  . MAMMOGRAM  04/13/2021  . INFLUENZA VACCINE  Completed    Qualifies for Shingles Vaccine? Yes  Zostavax completed n/a. Due for Shingrix. Education has been provided regarding the importance of this vaccine. Pt has been advised to call insurance company to determine out of pocket expense. Advised may also receive vaccine at local pharmacy or Health Dept. Verbalized acceptance and understanding.  Tdap: Discussed need for TD/TDAP vaccine, patient verbalized understanding that this is not covered as a preventative with there insurance and to call the office if she develops any new skin injuries, ie: cuts, scrapes, bug bites, or open wounds..  Flu Vaccine: up to date  Pneumococcal Vaccine: up to date .   Covid-19 Vaccine: information provided   Cancer Screenings:  Colorectal Screening: cologuard discussed.   Mammogram: Completed 04/14/2019. Repeat every year  Bone Density: Completed 2 years ago.  With [facility].   Lung Cancer Screening: (Low Dose CT Chest recommended if Age 58-80 years, 30 pack-year currently smoking OR have quit w/in 15years.) does not qualify.     Additional Screening:  Hepatitis C Screening: does qualify;declined   Vision Screening: Recommended annual ophthalmology exams for early detection of glaucoma and other disorders of the eye. Is the patient up to date with their annual eye exam?  Yes  Who is the provider or what is the name of the office in which the pt attends annual eye exams? Dr.Woodard   Dental Screening: Recommended annual dental exams for proper oral hygiene  Community Resource Referral:  CRR required this visit?  No       Plan:  I have personally reviewed and addressed the Medicare Annual Wellness questionnaire and have noted the following in the patient's chart:  A. Medical and social history B. Use of alcohol, tobacco or illicit drugs  C. Current medications and supplements D. Functional ability and status E.  Nutritional status F.  Physical activity G. Advance directives H. List of other physicians I.  Hospitalizations, surgeries, and ER visits in previous 12 months J.  Vitals K. Screenings such as hearing and vision if needed, cognitive and depression L. Referrals and appointments   In addition, I have reviewed and discussed with patient certain preventive protocols, quality metrics, and best practice recommendations. A written personalized care plan for preventive services as well as general preventive health recommendations were provided to patient.   Signed,    Collene Schlichter, LPN   [id number]  Nurse Health Advisor   Nurse Notes: none

## 2019-05-18 NOTE — Patient Instructions (Signed)
[name] , Thank you for taking time to come for your Medicare Wellness Visit. I appreciate your ongoing commitment to your health goals. Please review the following plan we discussed and let me know if I can assist you in the future.   Screening recommendations/referrals: Colonoscopy: discussed cologuard  Mammogram: up to date  Bone Density: discussed screening  Recommended yearly ophthalmology/optometry visit for glaucoma screening and checkup Recommended yearly dental visit for hygiene and checkup  Vaccinations: Influenza vaccine: up to date Pneumococcal vaccine: first dose completed Tdap vaccine: due now  Shingles vaccine: shingrix eligible    Covid-19: We are recommending the vaccine to everyone who has not had an allergic reaction to any of the components of the vaccine. If you have specific questions about the vaccine, please bring them up with your health care provider to discuss them.   We will likely not be getting the vaccine in the office for the first rounds of vaccinations. The way they are releasing the vaccines is going to be through the health systems (like [place], [facility], [facility], [facility]), through your county health department, or through the pharmacies.   The [facility] is giving vaccines to those 65+ and Health Care Workers Teachers and Child Care providers start 05/03/19, Essential workers start 3/10 and those with co-morbidities start 05/31/19 Call [phone] to schedule  If you are 65+ you can get a vaccine through [facility] by signing up for an appointment.  You can sign up by going to: [email or web address].  You can get more information by going to: [email or web address]  [employer] next door is giving the [employer]- you can call [phone] or stop by there to schedule.  Advanced directives:  Advance directive discussed with you today. Once this is complete please bring a copy in to our office so we can scan it  into your chart.  Conditions/risks identified: none   Next appointment: follow up in one year for your annual wellness visit.    Preventive Care 71 Years and Older, Female Preventive care refers to lifestyle choices and visits with your health care provider that can promote health and wellness. What does preventive care include?  A yearly physical exam. This is also called an annual well check.  Dental exams once or twice a year.  Routine eye exams. Ask your health care provider how often you should have your eyes checked.  Personal lifestyle choices, including:  Daily care of your teeth and gums.  Regular physical activity.  Eating a healthy diet.  Avoiding tobacco and drug use.  Limiting alcohol use.  Practicing safe sex.  Taking low-dose aspirin every day.  Taking vitamin and mineral supplements as recommended by your health care provider. What happens during an annual well check? The services and screenings done by your health care provider during your annual well check will depend on your age, overall health, lifestyle risk factors, and family history of disease. Counseling  Your health care provider may ask you questions about your:  Alcohol use.  Tobacco use.  Drug use.  Emotional well-being.  Home and relationship well-being.  Sexual activity.  Eating habits.  History of falls.  Memory and ability to understand (cognition).  Work and work Astronomer.  Reproductive health. Screening  You may have the following tests or measurements:  Height, weight, and BMI.  Blood pressure.  Lipid and cholesterol levels. These may be checked every 5 years, or more frequently if you are over 71 years old.  Skin check.  Lung cancer screening. You may have this screening every year starting at age 71 if you have a 30-pack-year history of smoking and currently smoke or have quit within the past 15 years.  Fecal occult blood test (FOBT) of the stool. You  may have this test every year starting at age 71.  Flexible sigmoidoscopy or colonoscopy. You may have a sigmoidoscopy every 5 years or a colonoscopy every 10 years starting at age 71.  Hepatitis C blood test.  Hepatitis B blood test.  Sexually transmitted disease (STD) testing.  Diabetes screening. This is done by checking your blood sugar (glucose) after you have not eaten for a while (fasting). You may have this done every 1-3 years.  Bone density scan. This is done to screen for osteoporosis. You may have this done starting at age 8.  Mammogram. This may be done every 1-2 years. Talk to your health care provider about how often you should have regular mammograms. Talk with your health care provider about your test results, treatment options, and if necessary, the need for more tests. Vaccines  Your health care provider may recommend certain vaccines, such as:  Influenza vaccine. This is recommended every year.  Tetanus, diphtheria, and acellular pertussis (Tdap, Td) vaccine. You may need a Td booster every 10 years.  Zoster vaccine. You may need this after age 71.  Pneumococcal 13-valent conjugate (PCV13) vaccine. One dose is recommended after age 71.  Pneumococcal polysaccharide (PPSV23) vaccine. One dose is recommended after age 71. Talk to your health care provider about which screenings and vaccines you need and how often you need them. This information is not intended to replace advice given to you by your health care provider. Make sure you discuss any questions you have with your health care provider. Document Released: 03/22/2015 Document Revised: 11/13/2015 Document Reviewed: 12/25/2014 Elsevier Interactive Patient Education  2017 [place] Prevention in the Home Falls can cause injuries. They can happen to people of all ages. There are many things you can do to make your home safe and to help prevent falls. What can I do on the outside of my  home?  Regularly fix the edges of walkways and driveways and fix any cracks.  Remove anything that might make you trip as you walk through a door, such as a raised step or threshold.  Trim any bushes or trees on the path to your home.  Use bright outdoor lighting.  Clear any walking paths of anything that might make someone trip, such as rocks or tools.  Regularly check to see if handrails are loose or broken. Make sure that both sides of any steps have handrails.  Any raised decks and porches should have guardrails on the edges.  Have any leaves, snow, or ice cleared regularly.  Use sand or salt on walking paths during winter.  Clean up any spills in your garage right away. This includes oil or grease spills. What can I do in the bathroom?  Use night lights.  Install grab bars by the toilet and in the tub and shower. Do not use towel bars as grab bars.  Use non-skid mats or decals in the tub or shower.  If you need to sit down in the shower, use a plastic, non-slip stool.  Keep the floor dry. Clean up any water that spills on the floor as soon as it happens.  Remove soap buildup in the tub or shower regularly.  Attach bath mats securely with double-sided non-slip  rug tape.  Do not have throw rugs and other things on the floor that can make you trip. What can I do in the bedroom?  Use night lights.  Make sure that you have a light by your bed that is easy to reach.  Do not use any sheets or blankets that are too big for your bed. They should not hang down onto the floor.  Have a firm chair that has side arms. You can use this for support while you get dressed.  Do not have throw rugs and other things on the floor that can make you trip. What can I do in the kitchen?  Clean up any spills right away.  Avoid walking on wet floors.  Keep items that you use a lot in easy-to-reach places.  If you need to reach something above you, use a strong step stool that has a  grab bar.  Keep electrical cords out of the way.  Do not use floor polish or wax that makes floors slippery. If you must use wax, use non-skid floor wax.  Do not have throw rugs and other things on the floor that can make you trip. What can I do with my stairs?  Do not leave any items on the stairs.  Make sure that there are handrails on both sides of the stairs and use them. Fix handrails that are broken or loose. Make sure that handrails are as long as the stairways.  Check any carpeting to make sure that it is firmly attached to the stairs. Fix any carpet that is loose or worn.  Avoid having throw rugs at the top or bottom of the stairs. If you do have throw rugs, attach them to the floor with carpet tape.  Make sure that you have a light switch at the top of the stairs and the bottom of the stairs. If you do not have them, ask someone to add them for you. What else can I do to help prevent falls?  Wear shoes that:  Do not have high heels.  Have rubber bottoms.  Are comfortable and fit you well.  Are closed at the toe. Do not wear sandals.  If you use a stepladder:  Make sure that it is fully opened. Do not climb a closed stepladder.  Make sure that both sides of the stepladder are locked into place.  Ask someone to hold it for you, if possible.  Clearly mark and make sure that you can see:  Any grab bars or handrails.  First and last steps.  Where the edge of each step is.  Use tools that help you move around (mobility aids) if they are needed. These include:  Canes.  Walkers.  Scooters.  Crutches.  Turn on the lights when you go into a dark area. Replace any light bulbs as soon as they burn out.  Set up your furniture so you have a clear path. Avoid moving your furniture around.  If any of your floors are uneven, fix them.  If there are any pets around you, be aware of where they are.  Review your medicines with your doctor. Some medicines can make  you feel dizzy. This can increase your chance of falling. Ask your doctor what other things that you can do to help prevent falls. This information is not intended to replace advice given to you by your health care provider. Make sure you discuss any questions you have with your health care provider. Document Released:  12/20/2008 Document Revised: 08/01/2015 Document Reviewed: 03/30/2014 Elsevier Interactive Patient Education  2017 [employer].

## 2019-06-30 ENCOUNTER — Ambulatory Visit (INDEPENDENT_AMBULATORY_CARE_PROVIDER_SITE_OTHER): Payer: PPO | Admitting: Nurse Practitioner

## 2019-06-30 ENCOUNTER — Encounter: Payer: Self-pay | Admitting: Nurse Practitioner

## 2019-06-30 ENCOUNTER — Other Ambulatory Visit: Payer: Self-pay

## 2019-06-30 DIAGNOSIS — F339 Major depressive disorder, recurrent, unspecified: Secondary | ICD-10-CM

## 2019-06-30 DIAGNOSIS — F411 Generalized anxiety disorder: Secondary | ICD-10-CM | POA: Diagnosis not present

## 2019-06-30 MED ORDER — ALPRAZOLAM 0.25 MG PO TABS: 0.2500 mg | ORAL_TABLET | Freq: Every evening | ORAL | 2 refills | Status: DC | PRN

## 2019-06-30 NOTE — Assessment & Plan Note (Addendum)
Chronic, stable.  Only taking Xanax 0.5 MG (1/2 tablet) at night for anxiety and rest.  Have sent in refill that reflects her current dosing, 0.25 MG QHS #30 tablets with 2 refills.  Had discussion about BEERS criteria and risks with these.   Will continue to monitor, she is up to date on contract and UDS.  She agrees to every 3 month visits, will return in 3 months.

## 2019-06-30 NOTE — Assessment & Plan Note (Signed)
Chronic, stable on Lexapro.  Denies SI/HI. Continue current medication regimen and adjust as needed.  Up to date on refills.  Return in 3 months for visit. 

## 2019-06-30 NOTE — Patient Instructions (Signed)

## 2019-06-30 NOTE — Progress Notes (Signed)
BP 132/68 (BP Location: Left Arm)   Pulse 76   Temp 98.7 F (37.1 C) (Oral)   Wt 108 lb (49 kg)   SpO2 97%   BMI 20.08 kg/m    Subjective:    Patient ID: Brandy Parker, female    DOB: October 01, 1950, 69 y.o.   MRN: [id number]  HPI: Brandy Parker is a 69 y.o. female  Chief Complaint  Patient presents with  . Anxiety   ANXIETY/STRESS Current medications include Xanax 0.5 MG, she takes only 1/2 tablet at night, and Lexapro 10 MG daily. Has been on Xanax for a long while per her report, several years (2008). Pt is aware of risks ofbenzomedication use to include increased sedation, respiratory suppression, falls, dependence and cardiovascular events. Ptwould like to continue treatment as benefit determined to outweigh risk.On PMP review her last fill Xanax was 06/21/2018 for 90 pills with one refill.  She does need refill at this time, will fill and change script to how she is taking.  She is a smoker, smokes a couple cigarettes a day at night.  Has been smoking since she was in her 30's. Duration:stable Anxious mood:no Excessive worrying:no Irritability:no Sweating:no Nausea:no Palpitations:no Hyperventilation:no Panic attacks:no Agoraphobia:no Obscessions/compulsions:no Depressed mood:no Depression screen [facility] 2/9 06/30/2019 05/18/2019 03/31/2019 02/09/2019  Decreased Interest 0 0 0 0  Down, Depressed, Hopeless 0 0 0 0  PHQ - 2 Score 0 0 0 0  Altered sleeping 0 - 0 0  Tired, decreased energy 0 - 1 0  Change in appetite 0 - 0 0  Feeling bad or failure about yourself  0 - 0 0  Trouble concentrating 0 - 0 0  Moving slowly or fidgety/restless 0 - 0 0  Suicidal thoughts 0 - 0 0  PHQ-9 Score 0 - 1 0  Difficult doing work/chores Not difficult at all - Not difficult at all Not difficult at all   Anhedonia:none Weight changes:no Insomnia:yeshard to fall asleep Hypersomnia:no Fatigue/loss of energy:no Feelings of worthlessness:no Feelings of  guilt:no Impaired concentration/indecisiveness:no Suicidal ideations:no Crying spells:no Recent Stressors/Life Changes:no Relationship problems: no Family stress: no Financial stress:no Job stress: no  GAD 7 : Generalized Anxiety Score 06/30/2019 03/31/2019 02/09/2019  Nervous, Anxious, on Edge 0 1 1  Control/stop worrying 0 0 0  Worry too much - different things 0 0 0  Trouble relaxing 0 1 0  Restless 0 0 1  Easily annoyed or irritable 0 0 1  Afraid - awful might happen 0 0 0  Total GAD 7 Score 0 2 3  Anxiety Difficulty Not difficult at all Not difficult at all Not difficult at all    Relevant past medical, surgical, family and social history reviewed and updated as indicated. Interim medical history since our last visit reviewed. Allergies and medications reviewed and updated.  Review of Systems  Constitutional: Negative for activity change, appetite change, diaphoresis, fatigue and fever.  Respiratory: Negative for cough, chest tightness and shortness of breath.   Cardiovascular: Negative for chest pain, palpitations and leg swelling.  Gastrointestinal: Negative.   Neurological: Negative.   Psychiatric/Behavioral: Negative for decreased concentration, self-injury, sleep disturbance and suicidal ideas. The patient is not nervous/anxious.     Per HPI unless specifically indicated above     Objective:    BP 132/68 (BP Location: Left Arm)   Pulse 76   Temp 98.7 F (37.1 C) (Oral)   Wt 108 lb (49 kg)   SpO2 97%   BMI 20.08 kg/m  Wt Readings from Last 3 Encounters:  06/30/19 108 lb (49 kg)  03/31/19 110 lb (49.9 kg)  07/20/16 115 lb (52.2 kg)    Physical Exam Vitals and nursing note reviewed.  Constitutional:      General: She is awake. She is not in acute distress.    Appearance: She is well-developed and well-groomed. She is not ill-appearing.  HENT:     Head: Normocephalic.     Right Ear: Hearing normal.     Left Ear: Hearing normal.   Eyes:     General: Lids are normal.        Right eye: No discharge.        Left eye: No discharge.     Conjunctiva/sclera: Conjunctivae normal.     Pupils: Pupils are equal, round, and reactive to light.  Neck:     Vascular: No carotid bruit.  Cardiovascular:     Rate and Rhythm: Normal rate and regular rhythm.     Heart sounds: Normal heart sounds. No murmur. No gallop.   Pulmonary:     Effort: Pulmonary effort is normal. No accessory muscle usage or respiratory distress.     Breath sounds: Normal breath sounds.  Abdominal:     General: Bowel sounds are normal.     Palpations: Abdomen is soft.  Musculoskeletal:     Cervical back: Normal range of motion and neck supple.     Right lower leg: No edema.     Left lower leg: No edema.  Skin:    General: Skin is warm and dry.  Neurological:     Mental Status: She is alert and oriented to person, place, and time.  Psychiatric:        Attention and Perception: Attention normal.        Mood and Affect: Mood normal.        Speech: Speech normal.        Behavior: Behavior normal. Behavior is cooperative.        Thought Content: Thought content normal.     Results for orders placed or performed in visit on 03/31/19  CBC with Differential/Platelet  Result Value Ref Range   WBC 6.2 3.4 - 10.8 x10E3/uL   RBC 4.03 3.77 - 5.28 x10E6/uL   Hemoglobin 12.8 11.1 - 15.9 g/dL   Hematocrit 38.6 34.0 - 46.6 %   MCV 96 79 - 97 fL   MCH 31.8 26.6 - 33.0 pg   MCHC 33.2 31.5 - 35.7 g/dL   RDW 11.9 11.7 - 15.4 %   Platelets 328 150 - 450 x10E3/uL   Neutrophils 41 Not Estab. %   Lymphs 50 Not Estab. %   Monocytes 7 Not Estab. %   Eos 1 Not Estab. %   Basos 1 Not Estab. %   Neutrophils Absolute 2.6 1.4 - 7.0 x10E3/uL   Lymphocytes Absolute 3.1 0.7 - 3.1 x10E3/uL   Monocytes Absolute 0.5 0.1 - 0.9 x10E3/uL   EOS (ABSOLUTE) 0.1 0.0 - 0.4 x10E3/uL   Basophils Absolute 0.1 0.0 - 0.2 x10E3/uL   Immature Granulocytes 0 Not Estab. %   Immature  Grans (Abs) 0.0 0.0 - 0.1 x10E3/uL  Comprehensive metabolic panel  Result Value Ref Range   Glucose 68 65 - 99 mg/dL   BUN 15 8 - 27 mg/dL   Creatinine, Ser 0.82 0.57 - 1.00 mg/dL   GFR calc non Af Amer 73 >59 mL/min/1.73   GFR calc Af Amer 84 >59 mL/min/1.73   BUN/Creatinine Ratio 18  12 - 28   Sodium 144 134 - 144 mmol/L   Potassium 5.0 3.5 - 5.2 mmol/L   Chloride 104 96 - 106 mmol/L   CO2 28 20 - 29 mmol/L   Calcium 9.7 8.7 - 10.3 mg/dL   Total Protein 6.6 6.0 - 8.5 g/dL   Albumin 4.6 3.8 - 4.8 g/dL   Globulin, Total 2.0 1.5 - 4.5 g/dL   Albumin/Globulin Ratio 2.3 (H) 1.2 - 2.2   Bilirubin Total <0.2 0.0 - 1.2 mg/dL   Alkaline Phosphatase 66 39 - 117 IU/L   AST 23 0 - 40 IU/L   ALT 13 0 - 32 IU/L  Lipid Panel w/o Chol/HDL Ratio  Result Value Ref Range   Cholesterol, Total 165 100 - 199 mg/dL   Triglycerides 72 0 - 149 mg/dL   HDL 82 >16 mg/dL   VLDL Cholesterol Cal 14 5 - 40 mg/dL   LDL Chol Calc (NIH) 69 0 - 99 mg/dL  TSH  Result Value Ref Range   TSH 0.853 0.450 - 4.500 uIU/mL  VITAMIN D 25 Hydroxy (Vit-D Deficiency, Fractures)  Result Value Ref Range   Vit D, 25-Hydroxy 60.2 30.0 - 100.0 ng/mL  Urine drugs of abuse scrn w alc, routine (Ref Lab)  Result Value Ref Range   Amphetamines, Urine CANCELED ng/mL   Barbiturate Quant, Ur CANCELED    Benzodiazepine Quant, Ur CANCELED    Cannabinoid Quant, Ur CANCELED    Cocaine (Metab.) CANCELED    Opiate Quant, Ur CANCELED    PCP Quant, Ur CANCELED    Methadone Screen, Urine CANCELED    Propoxyphene CANCELED    Ethanol, Urine CANCELED       Assessment & Plan:   Problem List Items Addressed This Visit      Other   Generalized anxiety disorder    Chronic, stable.  Only taking Xanax 0.5 MG (1/2 tablet) at night for anxiety and rest.  Have sent in refill that reflects her current dosing, 0.25 MG QHS #30 tablets with 2 refills.  Had discussion about BEERS criteria and risks with these.   Will continue to monitor, she is  up to date on contract and UDS.  She agrees to every 3 month visits, will return in 3 months.      Relevant Medications   ALPRAZolam (XANAX) 0.25 MG tablet   Depression, recurrent (HCC)    Chronic, stable on Lexapro.  Denies SI/HI. Continue current medication regimen and adjust as needed.  Up to date on refills.  Return in 3 months for visit.      Relevant Medications   ALPRAZolam (XANAX) 0.25 MG tablet       Follow up plan: Return in about 3 months (around 09/29/2019) for Anxiety and HLD.

## 2019-07-06 ENCOUNTER — Ambulatory Visit: Payer: PPO | Attending: Internal Medicine

## 2019-07-06 DIAGNOSIS — Z23 Encounter for immunization: Secondary | ICD-10-CM

## 2019-07-06 NOTE — Progress Notes (Signed)
   Covid-19 Vaccination Clinic  Name:  Brandy Parker    MRN: [id number] DOB: 08-19-1950  07/06/2019  [name] was observed post Covid-19 immunization for 15 minutes without incident. She was provided with Vaccine Information Sheet and instruction to access the V-Safe system.   [name] was instructed to call 911 with any severe reactions post vaccine: Marland Kitchen Difficulty breathing  . Swelling of face and throat  . A fast heartbeat  . A bad rash all over body  . Dizziness and weakness   Immunizations Administered    Name Date Dose VIS Date Route   Pfizer COVID-19 Vaccine 07/06/2019 11:22 AM 0.3 mL 05/03/2018 Intramuscular   Manufacturer: [employer], [employer]   Lot: [id number]   NDC: [address]-2

## 2019-08-01 ENCOUNTER — Ambulatory Visit: Payer: PPO | Attending: Internal Medicine

## 2019-08-01 DIAGNOSIS — Z23 Encounter for immunization: Secondary | ICD-10-CM

## 2019-08-01 NOTE — Progress Notes (Signed)
   Covid-19 Vaccination Clinic  Name:  Brandy Parker    MRN: [id number] DOB: Jun 16, 1950  08/01/2019  [name] was observed post Covid-19 immunization for 15 minutes without incident. She was provided with Vaccine Information Sheet and instruction to access the V-Safe system.   [name] was instructed to call 911 with any severe reactions post vaccine: Marland Kitchen Difficulty breathing  . Swelling of face and throat  . A fast heartbeat  . A bad rash all over body  . Dizziness and weakness   Immunizations Administered    Name Date Dose VIS Date Route   Pfizer COVID-19 Vaccine 08/01/2019 11:11 AM 0.3 mL 05/03/2018 Intramuscular   Manufacturer: [employer], [employer]   Lot: [id number]   NDC: [address]-0

## 2019-10-17 ENCOUNTER — Other Ambulatory Visit: Payer: Self-pay

## 2019-10-17 ENCOUNTER — Ambulatory Visit (INDEPENDENT_AMBULATORY_CARE_PROVIDER_SITE_OTHER): Payer: PPO | Admitting: Nurse Practitioner

## 2019-10-17 ENCOUNTER — Encounter: Payer: Self-pay | Admitting: Nurse Practitioner

## 2019-10-17 VITALS — BP 128/78 | HR 71 | Temp 98.6°F | Wt 108.0 lb

## 2019-10-17 DIAGNOSIS — E782 Mixed hyperlipidemia: Secondary | ICD-10-CM | POA: Diagnosis not present

## 2019-10-17 DIAGNOSIS — Z79899 Other long term (current) drug therapy: Secondary | ICD-10-CM

## 2019-10-17 DIAGNOSIS — F411 Generalized anxiety disorder: Secondary | ICD-10-CM

## 2019-10-17 DIAGNOSIS — F339 Major depressive disorder, recurrent, unspecified: Secondary | ICD-10-CM

## 2019-10-17 MED ORDER — ALPRAZOLAM 0.25 MG PO TABS: 0.2500 mg | ORAL_TABLET | Freq: Every evening | ORAL | 0 refills | Status: DC | PRN

## 2019-10-17 NOTE — Assessment & Plan Note (Signed)
Chronic, ongoing.  Continue current medication regimen and adjust as needed.  Plan on lipid panel today.  Refills as needed.

## 2019-10-17 NOTE — Patient Instructions (Signed)

## 2019-10-17 NOTE — Assessment & Plan Note (Signed)
Chronic, stable on Lexapro.  Denies SI/HI. Continue current medication regimen and adjust as needed.  Up to date on refills.  Return in 3 months for visit.

## 2019-10-17 NOTE — Assessment & Plan Note (Signed)
Chronic, stable.  Only taking Xanax 0.25 MG at night for anxiety and rest.  Have sent in refill, 0.25 MG QHS #90 tablets with 0 refills.  Had discussion about BEERS criteria and risks with these.   Will continue to monitor, she is up to date on contract and UDS.  She agrees to every 3 month visits, will return in 3 months.

## 2019-10-17 NOTE — Addendum Note (Signed)
Addended by: Aura Dials T on: 10/17/2019 04:30 PM   Modules accepted: Orders

## 2019-10-17 NOTE — Progress Notes (Signed)
BP 128/78 (BP Location: Left Arm)   Pulse 71   Temp 98.6 F (37 C) (Oral)   Wt 108 lb (49 kg)   SpO2 96%   BMI 20.08 kg/m    Subjective:    Patient ID: Brandy Parker, female    DOB: 1950/08/10, 69 y.o.   MRN: [id number]  HPI: Brandy Parker is a 69 y.o. female  Chief Complaint  Patient presents with  . Hyperlipidemia  . Anxiety   ANXIETY/STRESS Current medications includeXanax 0.25 MG PRN and Lexapro 10 MG daily. Has been on Xanax for a long while per her report, several years(2008). Pt is aware of risks ofbenzomedication use to include increased sedation, respiratory suppression, falls, dependence and cardiovascular events. Ptwould like to continue treatment as benefit determined to outweigh risk.On PMP review her last fill Xanax was 10/06/2019 for 30 pills. Previously got 90 day supply and would like this again. Duration:stable Anxious mood:no Excessive worrying:no Irritability:no Sweating:no Nausea:no Palpitations:no Hyperventilation:no Panic attacks:no Agoraphobia:no Obscessions/compulsions:no Depressed mood:no Depression screen [facility] 2/9 10/17/2019 06/30/2019 05/18/2019 03/31/2019 02/09/2019  Decreased Interest 0 0 0 0 0  Down, Depressed, Hopeless 0 0 0 0 0  PHQ - 2 Score 0 0 0 0 0  Altered sleeping 0 0 - 0 0  Tired, decreased energy 0 0 - 1 0  Change in appetite 0 0 - 0 0  Feeling bad or failure about yourself  0 0 - 0 0  Trouble concentrating 0 0 - 0 0  Moving slowly or fidgety/restless 0 0 - 0 0  Suicidal thoughts 0 0 - 0 0  PHQ-9 Score 0 0 - 1 0  Difficult doing work/chores Not difficult at all Not difficult at all - Not difficult at all Not difficult at all   GAD 7 : Generalized Anxiety Score 10/17/2019 06/30/2019 03/31/2019 02/09/2019  Nervous, Anxious, on Edge 0 0 1 1  Control/stop worrying 0 0 0 0  Worry too much - different things 0 0 0 0  Trouble relaxing 0 0 1 0  Restless 0 0 0 1  Easily annoyed or irritable 0 0 0 1  Afraid - awful  might happen 0 0 0 0  Total GAD 7 Score 0 0 2 3  Anxiety Difficulty Not difficult at all Not difficult at all Not difficult at all Not difficult at all   HYPERLIPIDEMIA Currently taking Simvastatin 20 MG daily.  She is a current smoker, smokes about 5-6 cigarettes a day.  Started smoking in 20's.  Not interested in quitting.   Hyperlipidemia status:good compliance Satisfied with current treatment?yes Side effects:no Medication compliance:good compliance Past cholesterol meds:simvastatin (zocor) Supplements:none Aspirin:no The 10-year ASCVD risk score Denman George DC Jr., et al., 2013) is: 24.2%   Values used to calculate the score:     Age: 50 years     Sex: Female     Is Non-Hispanic African American: No     Diabetic: Yes     Tobacco smoker: Yes     Systolic Blood Pressure: 128 mmHg     Is BP treated: No     HDL Cholesterol: 82 mg/dL     Total Cholesterol: 165 mg/dL Chest pain:no Coronary artery disease:no Family history CAD:no Family history early CAD:no  Relevant past medical, surgical, family and social history reviewed and updated as indicated. Interim medical history since our last visit reviewed. Allergies and medications reviewed and updated.  Review of Systems  Constitutional: Negative for activity change, appetite change, diaphoresis, fatigue and  fever.  Respiratory: Negative for cough, chest tightness and shortness of breath.   Cardiovascular: Negative for chest pain, palpitations and leg swelling.  Gastrointestinal: Negative.   Neurological: Negative.   Psychiatric/Behavioral: Negative for decreased concentration, self-injury, sleep disturbance and suicidal ideas. The patient is not nervous/anxious.     Per HPI unless specifically indicated above     Objective:    BP 128/78 (BP Location: Left Arm)   Pulse 71   Temp 98.6 F (37 C) (Oral)   Wt 108 lb (49 kg)   SpO2 96%   BMI 20.08 kg/m   Wt Readings from Last 3 Encounters:  10/17/19 108 lb  (49 kg)  06/30/19 108 lb (49 kg)  03/31/19 110 lb (49.9 kg)    Physical Exam Vitals and nursing note reviewed.  Constitutional:      General: She is awake. She is not in acute distress.    Appearance: She is well-developed and well-groomed. She is not ill-appearing.  HENT:     Head: Normocephalic.     Right Ear: Hearing normal.     Left Ear: Hearing normal.  Eyes:     General: Lids are normal.        Right eye: No discharge.        Left eye: No discharge.     Conjunctiva/sclera: Conjunctivae normal.     Pupils: Pupils are equal, round, and reactive to light.  Neck:     Vascular: No carotid bruit.  Cardiovascular:     Rate and Rhythm: Normal rate and regular rhythm.     Heart sounds: Normal heart sounds. No murmur heard.  No gallop.   Pulmonary:     Effort: Pulmonary effort is normal. No accessory muscle usage or respiratory distress.     Breath sounds: Normal breath sounds.  Abdominal:     General: Bowel sounds are normal.     Palpations: Abdomen is soft.  Musculoskeletal:     Cervical back: Normal range of motion and neck supple.     Right lower leg: No edema.     Left lower leg: No edema.  Skin:    General: Skin is warm and dry.  Neurological:     Mental Status: She is alert and oriented to person, place, and time.  Psychiatric:        Attention and Perception: Attention normal.        Mood and Affect: Mood normal.        Speech: Speech normal.        Behavior: Behavior normal. Behavior is cooperative.        Thought Content: Thought content normal.     Results for orders placed or performed in visit on 03/31/19  CBC with Differential/Platelet  Result Value Ref Range   WBC 6.2 3.4 - 10.8 x10E3/uL   RBC 4.03 3.77 - 5.28 x10E6/uL   Hemoglobin 12.8 11.1 - 15.9 g/dL   Hematocrit 01.6 01.0 - 46.6 %   MCV 96 79 - 97 fL   MCH 31.8 26.6 - 33.0 pg   MCHC 33.2 31 - 35 g/dL   RDW 93.2 35.5 - 73.2 %   Platelets 328 150 - 450 x10E3/uL   Neutrophils 41 Not Estab. %    Lymphs 50 Not Estab. %   Monocytes 7 Not Estab. %   Eos 1 Not Estab. %   Basos 1 Not Estab. %   Neutrophils Absolute 2.6 1 - 7 x10E3/uL   Lymphocytes Absolute 3.1 0 - 3  x10E3/uL   Monocytes Absolute 0.5 0 - 0 x10E3/uL   EOS (ABSOLUTE) 0.1 0.0 - 0.4 x10E3/uL   Basophils Absolute 0.1 0 - 0 x10E3/uL   Immature Granulocytes 0 Not Estab. %   Immature Grans (Abs) 0.0 0.0 - 0.1 x10E3/uL  Comprehensive metabolic panel  Result Value Ref Range   Glucose 68 65 - 99 mg/dL   BUN 15 8 - 27 mg/dL   Creatinine, Ser 1.610.82 0.57 - 1.00 mg/dL   GFR calc non Af Amer 73 >59 mL/min/1.73   GFR calc Af Amer 84 >59 mL/min/1.73   BUN/Creatinine Ratio 18 12 - 28   Sodium 144 134 - 144 mmol/L   Potassium 5.0 3.5 - 5.2 mmol/L   Chloride 104 96 - 106 mmol/L   CO2 28 20 - 29 mmol/L   Calcium 9.7 8.7 - 10.3 mg/dL   Total Protein 6.6 6.0 - 8.5 g/dL   Albumin 4.6 3.8 - 4.8 g/dL   Globulin, Total 2.0 1.5 - 4.5 g/dL   Albumin/Globulin Ratio 2.3 (H) 1.2 - 2.2   Bilirubin Total <0.2 0.0 - 1.2 mg/dL   Alkaline Phosphatase 66 39 - 117 IU/L   AST 23 0 - 40 IU/L   ALT 13 0 - 32 IU/L  Lipid Panel w/o Chol/HDL Ratio  Result Value Ref Range   Cholesterol, Total 165 100 - 199 mg/dL   Triglycerides 72 0 - 149 mg/dL   HDL 82 >09>39 mg/dL   VLDL Cholesterol Cal 14 5 - 40 mg/dL   LDL Chol Calc (NIH) 69 0 - 99 mg/dL  TSH  Result Value Ref Range   TSH 0.853 0.450 - 4.500 uIU/mL  VITAMIN D 25 Hydroxy (Vit-D Deficiency, Fractures)  Result Value Ref Range   Vit D, 25-Hydroxy 60.2 30.0 - 100.0 ng/mL  Urine drugs of abuse scrn w alc, routine (Ref Lab)  Result Value Ref Range   Amphetamines, Urine CANCELED ng/mL   Barbiturate Quant, Ur CANCELED    Benzodiazepine Quant, Ur CANCELED    Cannabinoid Quant, Ur CANCELED    Cocaine (Metab.) CANCELED    Opiate Quant, Ur CANCELED    PCP Quant, Ur CANCELED    Methadone Screen, Urine CANCELED    Propoxyphene CANCELED    Ethanol, Urine CANCELED       Assessment & Plan:   Problem  List Items Addressed This Visit      Other   Generalized anxiety disorder    Chronic, stable.  Only taking Xanax 0.25 MG at night for anxiety and rest.  Have sent in refill, 0.25 MG QHS #90 tablets with 0 refills.  Had discussion about BEERS criteria and risks with these.   Will continue to monitor, she is up to date on contract and UDS.  She agrees to every 3 month visits, will return in 3 months.      Relevant Medications   ALPRAZolam (XANAX) 0.25 MG tablet   Depression, recurrent (HCC) - Primary    Chronic, stable on Lexapro.  Denies SI/HI. Continue current medication regimen and adjust as needed.  Up to date on refills.  Return in 3 months for visit.      Relevant Medications   ALPRAZolam (XANAX) 0.25 MG tablet   Hyperlipidemia    Chronic, ongoing.  Continue current medication regimen and adjust as needed.  Plan on lipid panel today.  Refills as needed.      Relevant Orders   Lipid Panel w/o Chol/HDL Ratio   Long-term current use of benzodiazepine  Since 2008, Xanax.  Discussed BEERS criteria and risks today, she wishes to continue current regimen and agrees with every 3 month visits.  UDS and controlled substance contract up to date.          Follow up plan: Return in about 3 months (around 01/17/2020) for Anxiety.

## 2019-10-17 NOTE — Assessment & Plan Note (Signed)
Since 2008, Xanax.  Discussed BEERS criteria and risks today, she wishes to continue current regimen and agrees with every 3 month visits.  UDS and controlled substance contract up to date. 

## 2019-10-18 LAB — LIPID PANEL W/O CHOL/HDL RATIO
Cholesterol, Total: 175 mg/dL (ref 100–199)
HDL: 85 mg/dL (ref >39)
LDL Chol Calc (NIH): 74 mg/dL (ref 0–99)
Triglycerides: 87 mg/dL (ref 0–149)
VLDL Cholesterol Cal: 16 mg/dL (ref 5–40)

## 2019-10-18 NOTE — Progress Notes (Signed)
Contacted via MyChart  Good morning Brandy Parker.  Your labs have returned and cholesterol levels remain at goal, continue current medication regimen.  Have a wonderful day!! Keep being awesome!! Kindest regards, Freman Lapage

## 2019-12-17 ENCOUNTER — Other Ambulatory Visit: Payer: Self-pay | Admitting: Nurse Practitioner

## 2019-12-22 ENCOUNTER — Ambulatory Visit: Payer: PPO | Admitting: Nurse Practitioner

## 2019-12-22 ENCOUNTER — Other Ambulatory Visit: Payer: Self-pay

## 2019-12-28 DIAGNOSIS — H35039 Hypertensive retinopathy, unspecified eye: Secondary | ICD-10-CM | POA: Diagnosis not present

## 2019-12-28 DIAGNOSIS — H40013 Open angle with borderline findings, low risk, bilateral: Secondary | ICD-10-CM | POA: Diagnosis not present

## 2019-12-28 DIAGNOSIS — H2510 Age-related nuclear cataract, unspecified eye: Secondary | ICD-10-CM | POA: Diagnosis not present

## 2020-01-26 ENCOUNTER — Ambulatory Visit (INDEPENDENT_AMBULATORY_CARE_PROVIDER_SITE_OTHER): Payer: PPO | Admitting: Nurse Practitioner

## 2020-01-26 ENCOUNTER — Other Ambulatory Visit: Payer: Self-pay

## 2020-01-26 ENCOUNTER — Encounter: Payer: Self-pay | Admitting: Nurse Practitioner

## 2020-01-26 VITALS — BP 130/82 | HR 71 | Temp 98.3°F | Wt 106.4 lb

## 2020-01-26 DIAGNOSIS — F411 Generalized anxiety disorder: Secondary | ICD-10-CM

## 2020-01-26 DIAGNOSIS — Z23 Encounter for immunization: Secondary | ICD-10-CM | POA: Diagnosis not present

## 2020-01-26 DIAGNOSIS — F17210 Nicotine dependence, cigarettes, uncomplicated: Secondary | ICD-10-CM | POA: Insufficient documentation

## 2020-01-26 MED ORDER — ESCITALOPRAM OXALATE 10 MG PO TABS: 10.0000 mg | ORAL_TABLET | Freq: Every day | ORAL | 4 refills | Status: DC

## 2020-01-26 MED ORDER — ALPRAZOLAM 0.25 MG PO TABS: 0.2500 mg | ORAL_TABLET | Freq: Every evening | ORAL | 0 refills | Status: DC | PRN

## 2020-01-26 MED ORDER — SIMVASTATIN 20 MG PO TABS: 20.0000 mg | ORAL_TABLET | Freq: Every day | ORAL | 4 refills | Status: DC

## 2020-01-26 NOTE — Assessment & Plan Note (Signed)
Chronic, stable.  Only taking Xanax 0.25 MG at night for anxiety and rest.  Have sent in refill, 0.25 MG QHS #90 tablets with 0 refills.  Had discussion about BEERS criteria and risks with these.   Will continue to monitor, she is up to date on contract and UDS.  Continue Lexapro.  She agrees to every 3 month visits, will return in 3 months. 

## 2020-01-26 NOTE — Progress Notes (Signed)
.cfpb   BP 130/82 (BP Location: Left Arm)   Pulse 71   Temp 98.3 F (36.8 C)   Wt 106 lb 6.4 oz (48.3 kg)   SpO2 98%   BMI 19.78 kg/m    Subjective:    Patient ID: Brandy Parker, female    DOB: 1951-01-31, 69 y.o.   MRN: [id number]  HPI: Brandy Parker is a 69 y.o. female  Chief Complaint  Patient presents with  . Anxiety   ANXIETY/STRESS Current medications includeXanax 0.25 MG PRN and Lexapro 10 MG daily. Has been on Xanax for a long while per her report, several years(2008). Pt is aware of risks ofbenzomedication use to include increased sedation, respiratory suppression, falls, dependence and cardiovascular events. Ptwould like to continue treatment as benefit determined to outweigh risk.On PMP review her last fill Xanax was 11/17/19 for 90 day supply. Previously got 90 day supply and would like to continue this. Duration:stable Anxious mood:no Excessive worrying:no Irritability:no Sweating:no Nausea:no Palpitations:no Hyperventilation:no Panic attacks:no Agoraphobia:no Obscessions/compulsions:no Depressed mood:no Depression screen [facility] 2/9 01/26/2020 10/17/2019 06/30/2019 05/18/2019 03/31/2019  Decreased Interest 3 0 0 0 0  Down, Depressed, Hopeless 0 0 0 0 0  PHQ - 2 Score 3 0 0 0 0  Altered sleeping 0 0 0 - 0  Tired, decreased energy 0 0 0 - 1  Change in appetite 0 0 0 - 0  Feeling bad or failure about yourself  0 0 0 - 0  Trouble concentrating 0 0 0 - 0  Moving slowly or fidgety/restless 0 0 0 - 0  Suicidal thoughts 0 0 0 - 0  PHQ-9 Score 3 0 0 - 1  Difficult doing work/chores Not difficult at all Not difficult at all Not difficult at all - Not difficult at all   GAD 7 : Generalized Anxiety Score 01/26/2020 10/17/2019 06/30/2019 03/31/2019  Nervous, Anxious, on Edge 0 0 0 1  Control/stop worrying 0 0 0 0  Worry too much - different things 0 0 0 0  Trouble relaxing 0 0 0 1  Restless 0 0 0 0  Easily annoyed or irritable 0 0 0 0  Afraid - awful  might happen 0 0 0 0  Total GAD 7 Score 0 0 0 2  Anxiety Difficulty - Not difficult at all Not difficult at all Not difficult at all   Relevant past medical, surgical, family and social history reviewed and updated as indicated. Interim medical history since our last visit reviewed. Allergies and medications reviewed and updated.  Review of Systems  Constitutional: Negative for activity change, appetite change, diaphoresis, fatigue and fever.  Respiratory: Negative for cough, chest tightness and shortness of breath.   Cardiovascular: Negative for chest pain, palpitations and leg swelling.  Gastrointestinal: Negative.   Neurological: Negative.   Psychiatric/Behavioral: Negative for decreased concentration, self-injury, sleep disturbance and suicidal ideas. The patient is not nervous/anxious.     Per HPI unless specifically indicated above     Objective:    BP 130/82 (BP Location: Left Arm)   Pulse 71   Temp 98.3 F (36.8 C)   Wt 106 lb 6.4 oz (48.3 kg)   SpO2 98%   BMI 19.78 kg/m   Wt Readings from Last 3 Encounters:  01/26/20 106 lb 6.4 oz (48.3 kg)  10/17/19 108 lb (49 kg)  06/30/19 108 lb (49 kg)    Physical Exam Vitals and nursing note reviewed.  Constitutional:      General: She is awake. She is  not in acute distress.    Appearance: She is well-developed and well-groomed. She is not ill-appearing.  HENT:     Head: Normocephalic.     Right Ear: Hearing normal.     Left Ear: Hearing normal.  Eyes:     General: Lids are normal.        Right eye: No discharge.        Left eye: No discharge.     Conjunctiva/sclera: Conjunctivae normal.     Pupils: Pupils are equal, round, and reactive to light.  Neck:     Vascular: No carotid bruit.  Cardiovascular:     Rate and Rhythm: Normal rate and regular rhythm.     Heart sounds: Normal heart sounds. No murmur heard.  No gallop.   Pulmonary:     Effort: Pulmonary effort is normal. No accessory muscle usage or respiratory  distress.     Breath sounds: Normal breath sounds.  Abdominal:     General: Bowel sounds are normal.     Palpations: Abdomen is soft.  Musculoskeletal:     Cervical back: Normal range of motion and neck supple.     Right lower leg: No edema.     Left lower leg: No edema.  Skin:    General: Skin is warm and dry.  Neurological:     Mental Status: She is alert and oriented to person, place, and time.  Psychiatric:        Attention and Perception: Attention normal.        Mood and Affect: Mood normal.        Speech: Speech normal.        Behavior: Behavior normal. Behavior is cooperative.        Thought Content: Thought content normal.     Results for orders placed or performed in visit on 10/17/19  Lipid Panel w/o Chol/HDL Ratio  Result Value Ref Range   Cholesterol, Total 175 100 - 199 mg/dL   Triglycerides 87 0 - 149 mg/dL   HDL 85 >12 mg/dL   VLDL Cholesterol Cal 16 5 - 40 mg/dL   LDL Chol Calc (NIH) 74 0 - 99 mg/dL      Assessment & Plan:   Problem List Items Addressed This Visit      Other   Generalized anxiety disorder - Primary    Chronic, stable.  Only taking Xanax 0.25 MG at night for anxiety and rest.  Have sent in refill, 0.25 MG QHS #90 tablets with 0 refills.  Had discussion about BEERS criteria and risks with these.   Will continue to monitor, she is up to date on contract and UDS.  Continue Lexapro.  She agrees to every 3 month visits, will return in 3 months.      Relevant Medications   ALPRAZolam (XANAX) 0.25 MG tablet (Start on 02/16/2020)   escitalopram (LEXAPRO) 10 MG tablet    Other Visit Diagnoses    Need for influenza vaccination       Flu vaccine today   Relevant Orders   Flu Vaccine QUAD High Dose(Fluad) (Completed)       Follow up plan: Return in about 14 weeks (around 05/03/2020) for Annual physical and mood visit.

## 2020-01-26 NOTE — Patient Instructions (Signed)

## 2020-05-03 ENCOUNTER — Ambulatory Visit (INDEPENDENT_AMBULATORY_CARE_PROVIDER_SITE_OTHER): Payer: PPO | Admitting: Nurse Practitioner

## 2020-05-03 ENCOUNTER — Encounter: Payer: Self-pay | Admitting: Nurse Practitioner

## 2020-05-03 ENCOUNTER — Other Ambulatory Visit: Payer: Self-pay

## 2020-05-03 VITALS — BP 164/84 | HR 65 | Temp 98.4°F | Ht 62.0 in | Wt 107.0 lb

## 2020-05-03 DIAGNOSIS — F411 Generalized anxiety disorder: Secondary | ICD-10-CM

## 2020-05-03 DIAGNOSIS — Z1159 Encounter for screening for other viral diseases: Secondary | ICD-10-CM | POA: Diagnosis not present

## 2020-05-03 DIAGNOSIS — M8588 Other specified disorders of bone density and structure, other site: Secondary | ICD-10-CM | POA: Diagnosis not present

## 2020-05-03 DIAGNOSIS — Z0000 Encounter for general adult medical examination without abnormal findings: Secondary | ICD-10-CM | POA: Diagnosis not present

## 2020-05-03 DIAGNOSIS — Z23 Encounter for immunization: Secondary | ICD-10-CM | POA: Diagnosis not present

## 2020-05-03 DIAGNOSIS — F17210 Nicotine dependence, cigarettes, uncomplicated: Secondary | ICD-10-CM

## 2020-05-03 DIAGNOSIS — Z1231 Encounter for screening mammogram for malignant neoplasm of breast: Secondary | ICD-10-CM

## 2020-05-03 DIAGNOSIS — Z79899 Other long term (current) drug therapy: Secondary | ICD-10-CM | POA: Diagnosis not present

## 2020-05-03 DIAGNOSIS — E782 Mixed hyperlipidemia: Secondary | ICD-10-CM

## 2020-05-03 DIAGNOSIS — Z1329 Encounter for screening for other suspected endocrine disorder: Secondary | ICD-10-CM | POA: Diagnosis not present

## 2020-05-03 MED ORDER — ALPRAZOLAM 0.25 MG PO TABS: 0.2500 mg | ORAL_TABLET | Freq: Every evening | ORAL | 0 refills | Status: DC | PRN

## 2020-05-03 NOTE — Assessment & Plan Note (Signed)
Chronic, stable.  Only taking Xanax 0.25 MG at night for anxiety and rest.  Have sent in refill, 0.25 MG QHS #90 tablets with 0 refills.  Had discussion about BEERS criteria and risks with these.   Will continue to monitor, she is up to date on contract, UDS due today.  Continue Lexapro.  She agrees to every 3 month visits, will return in 3 months.

## 2020-05-03 NOTE — Assessment & Plan Note (Signed)
Noted on DEXA 2018, continue Vitamin D and check level today.  DEXA repeat in 2023.

## 2020-05-03 NOTE — Assessment & Plan Note (Signed)
I have recommended complete cessation of tobacco use. I have discussed various options available for assistance with tobacco cessation including over the counter methods (Nicotine gum, patch and lozenges). We also discussed prescription options (Chantix, Nicotine Inhaler / Nasal Spray). The patient is not interested in pursuing any prescription tobacco cessation options at this time.  

## 2020-05-03 NOTE — Assessment & Plan Note (Signed)
Chronic, ongoing.  Continue current medication regimen and adjust as needed.  Plan on lipid panel today.  Refills as needed.

## 2020-05-03 NOTE — Assessment & Plan Note (Signed)
Since 2008, Xanax.  Discussed BEERS criteria and risks today, she wishes to continue current regimen and agrees with every 3 month visits.  UDS today and controlled substance contract up to date.

## 2020-05-03 NOTE — Patient Instructions (Addendum)
[facility]  Address: [address], [place],  [address]  Phone: [phone]   Healthy Eating Following a healthy eating pattern may help you to achieve and maintain a healthy body weight, reduce the risk of chronic disease, and live a long and productive life. It is important to follow a healthy eating pattern at an appropriate calorie level for your body. Your nutritional needs should be met primarily through food by choosing a variety of nutrient-rich foods. What are tips for following this plan? Reading food labels  Read labels and choose the following: ? Reduced or low sodium. ? Juices with 100% fruit juice. ? Foods with low saturated fats and high polyunsaturated and monounsaturated fats. ? Foods with whole grains, such as whole wheat, cracked wheat, brown rice, and wild rice. ? Whole grains that are fortified with folic acid. This is recommended for women who are pregnant or who want to become pregnant.  Read labels and avoid the following: ? Foods with a lot of added sugars. These include foods that contain brown sugar, corn sweetener, corn syrup, dextrose, fructose, glucose, high-fructose corn syrup, honey, invert sugar, lactose, malt syrup, maltose, molasses, raw sugar, sucrose, trehalose, or turbinado sugar.  Do not eat more than the following amounts of added sugar per day:  6 teaspoons (25 g) for women.  9 teaspoons (38 g) for men. ? Foods that contain processed or refined starches and grains. ? Refined grain products, such as white flour, degermed cornmeal, white bread, and white rice. Shopping  Choose nutrient-rich snacks, such as vegetables, whole fruits, and nuts. Avoid high-calorie and high-sugar snacks, such as potato chips, fruit snacks, and candy.  Use oil-based dressings and spreads on foods instead of solid fats such as butter, stick margarine, or cream cheese.  Limit pre-made sauces, mixes, and "instant" products  such as flavored rice, instant noodles, and ready-made pasta.  Try more plant-protein sources, such as tofu, tempeh, black beans, edamame, lentils, nuts, and seeds.  Explore eating plans such as the Mediterranean diet or vegetarian diet. Cooking  Use oil to saut or stir-fry foods instead of solid fats such as butter, stick margarine, or lard.  Try baking, boiling, grilling, or broiling instead of frying.  Remove the fatty part of meats before cooking.  Steam vegetables in water or broth. Meal planning  At meals, imagine dividing your plate into fourths: ? One-half of your plate is fruits and vegetables. ? One-fourth of your plate is whole grains. ? One-fourth of your plate is protein, especially lean meats, poultry, eggs, tofu, beans, or nuts.  Include low-fat dairy as part of your daily diet.   Lifestyle  Choose healthy options in all settings, including home, work, school, restaurants, or stores.  Prepare your food safely: ? Wash your hands after handling raw meats. ? Keep food preparation surfaces clean by regularly washing with hot, soapy water. ? Keep raw meats separate from ready-to-eat foods, such as fruits and vegetables. ? Cook seafood, meat, poultry, and eggs to the recommended internal temperature. ? Store foods at safe temperatures. In general:  Keep cold foods at 40F (4.4C) or below.  Keep hot foods at 140F (60C) or above.  Keep your freezer at 0F (-17.8C) or below.  Foods are no longer safe to eat when they have been between the temperatures of 40-140F (4.4-60C) for more than 2 hours. What foods should I eat? Fruits Aim to eat 2 cup-equivalents of fresh, canned (in natural juice), or   frozen fruits each day. Examples of 1 cup-equivalent of fruit include 1 small apple, 8 large strawberries, 1 cup canned fruit,  cup dried fruit, or 1 cup 100% juice. Vegetables Aim to eat 2-3 cup-equivalents of fresh and frozen vegetables each day, including different  varieties and colors. Examples of 1 cup-equivalent of vegetables include 2 medium carrots, 2 cups raw, leafy greens, 1 cup chopped vegetable (raw or cooked), or 1 medium baked potato. Grains Aim to eat 6 ounce-equivalents of whole grains each day. Examples of 1 ounce-equivalent of grains include 1 slice of bread, 1 cup ready-to-eat cereal, 3 cups popcorn, or  cup cooked rice, pasta, or cereal. Meats and other proteins Aim to eat 5-6 ounce-equivalents of protein each day. Examples of 1 ounce-equivalent of protein include 1 egg, 1/2 cup nuts or seeds, or 1 tablespoon (16 g) peanut butter. A cut of meat or fish that is the size of a deck of cards is about 3-4 ounce-equivalents.  Of the protein you eat each week, try to have at least 8 ounces come from seafood. This includes salmon, trout, herring, and anchovies. Dairy Aim to eat 3 cup-equivalents of fat-free or low-fat dairy each day. Examples of 1 cup-equivalent of dairy include 1 cup (240 mL) milk, 8 ounces (250 g) yogurt, 1 ounces (44 g) natural cheese, or 1 cup (240 mL) fortified soy milk. Fats and oils  Aim for about 5 teaspoons (21 g) per day. Choose monounsaturated fats, such as canola and olive oils, avocados, peanut butter, and most nuts, or polyunsaturated fats, such as sunflower, corn, and soybean oils, walnuts, pine nuts, sesame seeds, sunflower seeds, and flaxseed. Beverages  Aim for six 8-oz glasses of water per day. Limit coffee to three to five 8-oz cups per day.  Limit caffeinated beverages that have added calories, such as soda and energy drinks.  Limit alcohol intake to no more than 1 drink a day for nonpregnant women and 2 drinks a day for men. One drink equals 12 oz of beer (355 mL), 5 oz of wine (148 mL), or 1 oz of hard liquor (44 mL). Seasoning and other foods  Avoid adding excess amounts of salt to your foods. Try flavoring foods with herbs and spices instead of salt.  Avoid adding sugar to foods.  Try using  oil-based dressings, sauces, and spreads instead of solid fats. This information is based on general [place] nutrition guidelines. For more information, visit choosemyplate.gov. Exact amounts may vary based on your nutrition needs. Summary  A healthy eating plan may help you to maintain a healthy weight, reduce the risk of chronic diseases, and stay active throughout your life.  Plan your meals. Make sure you eat the right portions of a variety of nutrient-rich foods.  Try baking, boiling, grilling, or broiling instead of frying.  Choose healthy options in all settings, including home, work, school, restaurants, or stores. This information is not intended to replace advice given to you by your health care provider. Make sure you discuss any questions you have with your health care provider. Document Revised: 06/07/2017 Document Reviewed: 06/07/2017 Elsevier Patient Education  2021 [employer]  

## 2020-05-03 NOTE — Progress Notes (Signed)
BP (!) 164/84 (BP Location: Right Arm, Cuff Size: Normal)   Pulse 65   Temp 98.4 F (36.9 C) (Oral)   Ht 5\' 2"  (1.575 m)   Wt 107 lb (48.5 kg)   SpO2 99%   BMI 19.57 kg/m    Subjective:    Patient ID: Brandy Parker, female    DOB: [birth year], 70 y.o.   MRN: [id number]  HPI: Brandy Parker is a 70 y.o. female presenting on 05/03/2020 for comprehensive medical examination. Current medical complaints include:none  She currently lives with: self Menopausal Symptoms: no   ANXIETY/STRESS Current medications include Xanax 0.25 MG and Lexapro 10 MG daily.  Has been on Xanax for a long while per her report, several years (2008).  Pt is aware of risks of benzo medication use to include increased sedation, respiratory suppression, falls, extrapyramidal movements,  dependence and cardiovascular events.  Pt would like to continue treatment as benefit determined to outweigh risk.  Last fill on PDMP review was 03/29/20. Duration:stable Anxious mood: no  Excessive worrying: no Irritability: no  Sweating: no Nausea: no Palpitations:no Hyperventilation: no Panic attacks: no Agoraphobia: no  Obscessions/compulsions: no Depressed mood: no Depression screen [facility] 2/9 05/03/2020 01/26/2020 10/17/2019 06/30/2019 05/18/2019  Decreased Interest 0 3 0 0 0  Down, Depressed, Hopeless 0 0 0 0 0  PHQ - 2 Score 0 3 0 0 0  Altered sleeping 0 0 0 0 -  Tired, decreased energy 0 0 0 0 -  Change in appetite 0 0 0 0 -  Feeling bad or failure about yourself  0 0 0 0 -  Trouble concentrating 0 0 0 0 -  Moving slowly or fidgety/restless 0 0 0 0 -  Suicidal thoughts 0 0 0 0 -  PHQ-9 Score 0 3 0 0 -  Difficult doing work/chores - Not difficult at all Not difficult at all Not difficult at all -   Anhedonia: none Weight changes: no Insomnia: yes hard to fall asleep  Hypersomnia: no Fatigue/loss of energy: no Feelings of worthlessness: no Feelings of guilt: no Impaired concentration/indecisiveness: no Suicidal  ideations: no  Crying spells: no Recent Stressors/Life Changes: no   Relationship problems: no   Family stress: no     Financial stress: no    Job stress: no    Recent death/loss: no  GAD 7 : Generalized Anxiety Score 05/03/2020 01/26/2020 10/17/2019 06/30/2019  Nervous, Anxious, on Edge 0 0 0 0  Control/stop worrying 0 0 0 0  Worry too much - different things 0 0 0 0  Trouble relaxing 0 0 0 0  Restless 0 0 0 0  Easily annoyed or irritable 0 0 0 0  Afraid - awful might happen 0 0 0 0  Total GAD 7 Score 0 0 0 0  Anxiety Difficulty Not difficult at all - Not difficult at all Not difficult at all    HYPERLIPIDEMIA Currently taking Simvastatin 20 MG daily.  She is a current smoker, smokes about 5-6 cigarettes a day.  Started smoking in 20's.  Not interested in quitting.    Osteopenia noted on DEXA in 2018.   Hyperlipidemia status: good compliance Satisfied with current treatment?  yes Side effects:  no Medication compliance: good compliance Past cholesterol meds: simvastatin (zocor) Supplements: none Aspirin:  no The 10-year ASCVD risk score 2019 DC Jr., et al., 2013) is: 39.9%   Values used to calculate the score:     Age: 64 years  Sex: Female     Is Non-Hispanic African American: No     Diabetic: Yes     Tobacco smoker: Yes     Systolic Blood Pressure: 164 mmHg     Is BP treated: No     HDL Cholesterol: 85 mg/dL     Total Cholesterol: 175 mg/dL Chest pain:  no Coronary artery disease:  no Family history CAD:  no Family history early CAD:  no  The patient does not have a history of falls. I did not complete a risk assessment for falls. A plan of care for falls was not documented.   Past Medical History:  Past Medical History:  Diagnosis Date  . Anxiety   . Hyperlipemia     Surgical History:  Past Surgical History:  Procedure Laterality Date  . ORIF ANKLE FRACTURE Left 07/20/2016   Procedure: OPEN REDUCTION INTERNAL FIXATION (ORIF) ANKLE FRACTURE;  Surgeon:  Lyndle Herrlich, MD;  Location: [facility] ORS;  Service: Orthopedics;  Laterality: Left;    Medications:  Current Outpatient Medications on File Prior to Visit  Medication Sig  . cholecalciferol (VITAMIN D3) 25 MCG (1000 UNIT) tablet Take 1,000 Units by mouth daily.  Marland Kitchen escitalopram (LEXAPRO) 10 MG tablet Take 1 tablet (10 mg total) by mouth daily.  . Multiple Vitamin (MULTIVITAMIN) tablet Take 1 tablet by mouth daily.  . simvastatin (ZOCOR) 20 MG tablet Take 1 tablet (20 mg total) by mouth daily at 6 PM.   No current facility-administered medications on file prior to visit.    Allergies:  Allergies  Allergen Reactions  . Sulfa Antibiotics Other (See Comments)    Per patient, unknown reaction    Social History:  Social History   Socioeconomic History  . Marital status: Single    Spouse name: Not on file  . Number of children: Not on file  . Years of education: Not on file  . Highest education level: Not on file  Occupational History  . Occupation: fulltime     Employer: [employer]  Tobacco Use  . Smoking status: Current Every Day Smoker    Packs/day: 0.25    Types: Cigarettes  . Smokeless tobacco: Never Used  Vaping Use  . Vaping Use: Never used  Substance and Sexual Activity  . Alcohol use: No  . Drug use: No  . Sexual activity: Not on file  Other Topics Concern  . Not on file  Social History Narrative  . Not on file   Social Determinants of Health   Financial Resource Strain: Not on file  Food Insecurity: Not on file  Transportation Needs: Not on file  Physical Activity: Not on file  Stress: Not on file  Social Connections: Not on file  Intimate Partner Violence: Not on file   Social History   Tobacco Use  Smoking Status Current Every Day Smoker  . Packs/day: 0.25  . Types: Cigarettes  Smokeless Tobacco Never Used   Social History   Substance and Sexual Activity  Alcohol Use No    Family History:  Family History  Problem Relation Age of Onset  .  Hypertension Mother   . Heart failure Mother   . Hypertension Father   . Hyperlipidemia Father   . Cancer Father        bladder  . Heart failure Father   . Cancer Sister        bladder  . Breast cancer Neg Hx     Past medical history, surgical history, medications, allergies, family history  and social history reviewed with patient today and changes made to appropriate areas of the chart.   Review of Systems - negative All other ROS negative except what is listed above and in the HPI.      Objective:    BP (!) 164/84 (BP Location: Right Arm, Cuff Size: Normal)   Pulse 65   Temp 98.4 F (36.9 C) (Oral)   Ht 5\' 2"  (1.575 m)   Wt 107 lb (48.5 kg)   SpO2 99%   BMI 19.57 kg/m   Wt Readings from Last 3 Encounters:  05/03/20 107 lb (48.5 kg)  01/26/20 106 lb 6.4 oz (48.3 kg)  10/17/19 108 lb (49 kg)    Physical Exam Vitals and nursing note reviewed.  Constitutional:      General: She is awake. She is not in acute distress.    Appearance: She is well-developed. She is not ill-appearing.  HENT:     Head: Normocephalic and atraumatic.     Right Ear: Hearing, tympanic membrane, ear canal and external ear normal. No drainage.     Left Ear: Hearing, tympanic membrane, ear canal and external ear normal. No drainage.     Nose: Nose normal.     Right Sinus: No maxillary sinus tenderness or frontal sinus tenderness.     Left Sinus: No maxillary sinus tenderness or frontal sinus tenderness.     Mouth/Throat:     Mouth: Mucous membranes are moist.     Pharynx: Oropharynx is clear. Uvula midline. No pharyngeal swelling, oropharyngeal exudate or posterior oropharyngeal erythema.  Eyes:     General: Lids are normal.        Right eye: No discharge.        Left eye: No discharge.     Extraocular Movements: Extraocular movements intact.     Conjunctiva/sclera: Conjunctivae normal.     Pupils: Pupils are equal, round, and reactive to light.     Visual Fields: Right eye visual fields  normal and left eye visual fields normal.  Neck:     Thyroid: No thyromegaly.     Vascular: No carotid bruit.     Trachea: Trachea normal.  Cardiovascular:     Rate and Rhythm: Normal rate and regular rhythm.     Heart sounds: Normal heart sounds. No murmur heard. No gallop.   Pulmonary:     Effort: Pulmonary effort is normal. No accessory muscle usage or respiratory distress.     Breath sounds: Normal breath sounds.  Chest:  Breasts:     Right: Normal. No axillary adenopathy or supraclavicular adenopathy.     Left: Normal. No axillary adenopathy or supraclavicular adenopathy.    Abdominal:     General: Bowel sounds are normal.     Palpations: Abdomen is soft. There is no hepatomegaly or splenomegaly.     Tenderness: There is no abdominal tenderness.  Musculoskeletal:        General: Normal range of motion.     Cervical back: Normal range of motion and neck supple.     Right lower leg: No edema.     Left lower leg: No edema.  Lymphadenopathy:     Head:     Right side of head: No submental, submandibular, tonsillar, preauricular or posterior auricular adenopathy.     Left side of head: No submental, submandibular, tonsillar, preauricular or posterior auricular adenopathy.     Cervical: No cervical adenopathy.     Upper Body:     Right upper body: No  supraclavicular, axillary or pectoral adenopathy.     Left upper body: No supraclavicular, axillary or pectoral adenopathy.  Skin:    General: Skin is warm and dry.     Capillary Refill: Capillary refill takes less than 2 seconds.     Findings: No rash.  Neurological:     Mental Status: She is alert and oriented to person, place, and time.     Cranial Nerves: Cranial nerves are intact.     Gait: Gait is intact.     Deep Tendon Reflexes: Reflexes are normal and symmetric.     Reflex Scores:      Brachioradialis reflexes are 2+ on the right side and 2+ on the left side.      Patellar reflexes are 2+ on the right side and 2+ on  the left side. Psychiatric:        Attention and Perception: Attention normal.        Mood and Affect: Mood normal.        Speech: Speech normal.        Behavior: Behavior normal. Behavior is cooperative.        Thought Content: Thought content normal.        Judgment: Judgment normal.    Results for orders placed or performed in visit on 10/17/19  Lipid Panel w/o Chol/HDL Ratio  Result Value Ref Range   Cholesterol, Total 175 100 - 199 mg/dL   Triglycerides 87 0 - 149 mg/dL   HDL 85 >16>39 mg/dL   VLDL Cholesterol Cal 16 5 - 40 mg/dL   LDL Chol Calc (NIH) 74 0 - 99 mg/dL      Assessment & Plan:   Problem List Items Addressed This Visit      Musculoskeletal and Integument   Osteopenia of lumbar spine    Noted on DEXA 2018, continue Vitamin D and check level today.  DEXA repeat in 2023.      Relevant Orders   VITAMIN D 25 Hydroxy (Vit-D Deficiency, Fractures)     Other   Generalized anxiety disorder    Chronic, stable.  Only taking Xanax 0.25 MG at night for anxiety and rest.  Have sent in refill, 0.25 MG QHS #90 tablets with 0 refills.  Had discussion about BEERS criteria and risks with these.   Will continue to monitor, she is up to date on contract, UDS due today.  Continue Lexapro.  She agrees to every 3 month visits, will return in 3 months.      Relevant Medications   ALPRAZolam (XANAX) 0.25 MG tablet   Hyperlipidemia - Primary    Chronic, ongoing.  Continue current medication regimen and adjust as needed.  Plan on lipid panel today.  Refills as needed.      Relevant Orders   Comprehensive metabolic panel   Lipid Panel w/o Chol/HDL Ratio   Long-term current use of benzodiazepine    Since 2008, Xanax.  Discussed BEERS criteria and risks today, she wishes to continue current regimen and agrees with every 3 month visits.  UDS today and controlled substance contract up to date.      Relevant Orders   [id number] 11+Oxyco+Alc+Crt-Bund   Nicotine dependence, cigarettes,  uncomplicated    I have recommended complete cessation of tobacco use. I have discussed various options available for assistance with tobacco cessation including over the counter methods (Nicotine gum, patch and lozenges). We also discussed prescription options (Chantix, Nicotine Inhaler / Nasal Spray). The patient is not interested in pursuing  any prescription tobacco cessation options at this time.        Other Visit Diagnoses    Encounter for annual physical exam       Annual labs today to include CBC, CMP, TSH, lipid   Relevant Orders   CBC with Differential/Platelet   TSH   Thyroid disorder screening       TSH on labs today   Relevant Orders   TSH   Need for hepatitis C screening test       Hep C screening x 1 per guidelines obtained on labs today.   Relevant Orders   Hepatitis C antibody   Encounter for screening mammogram for malignant neoplasm of breast       Mammogram ordered.   Relevant Orders   MM 3D SCREEN BREAST BILATERAL   23-polyvalent pneumococcal polysaccharide vaccine administered       PPSV23 administered today.   Relevant Orders   Pneumococcal polysaccharide vaccine 23-valent greater than or equal to 2yo subcutaneous/IM (Completed)       Follow up plan: Return in about 3 months (around 07/31/2020) for Anxiety.   LABORATORY TESTING:  - Pap smear: not applicable  IMMUNIZATIONS:   - Tdap: Tetanus vaccination status reviewed: last tetanus booster within 10 years, refused, next visit. - Influenza: Up to date - Pneumovax: Administered today - Prevnar: Up to date - 03/31/19 - HPV: Not applicable - Zostavax vaccine: Refused  SCREENING: -Mammogram: last 04/14/2019 -- ordered today - Colonoscopy: Refused  - Bone Density: Up To Date 02/01/2017 with osteopenia -Hearing Test: Not applicable  -Spirometry: Not applicable   PATIENT COUNSELING:   Advised to take 1 mg of folate supplement per day if capable of pregnancy.   Sexuality: Discussed sexually  transmitted diseases, partner selection, use of condoms, avoidance of unintended pregnancy  and contraceptive alternatives.   Advised to avoid cigarette smoking.  I discussed with the patient that most people either abstain from alcohol or drink within safe limits (<=14/week and <=4 drinks/occasion for males, <=7/weeks and <= 3 drinks/occasion for females) and that the risk for alcohol disorders and other health effects rises proportionally with the number of drinks per week and how often a drinker exceeds daily limits.  Discussed cessation/primary prevention of drug use and availability of treatment for abuse.   Diet: Encouraged to adjust caloric intake to maintain  or achieve ideal body weight, to reduce intake of dietary saturated fat and total fat, to limit sodium intake by avoiding high sodium foods and not adding table salt, and to maintain adequate dietary potassium and calcium preferably from fresh fruits, vegetables, and low-fat dairy products.    Stressed the importance of regular exercise  Injury prevention: Discussed safety belts, safety helmets, smoke detector, smoking near bedding or upholstery.   Dental health: Discussed importance of regular tooth brushing, flossing, and dental visits.    NEXT PREVENTATIVE PHYSICAL DUE IN 1 YEAR. Return in about 3 months (around 07/31/2020) for Anxiety.

## 2020-05-04 LAB — CBC WITH DIFFERENTIAL/PLATELET
Basophils Absolute: 0.1 10*3/uL (ref 0.0–0.2)
Basos: 1 %
EOS (ABSOLUTE): 0.1 10*3/uL (ref 0.0–0.4)
Eos: 2 %
Hematocrit: 39.8 % (ref 34.0–46.6)
Hemoglobin: 13.5 g/dL (ref 11.1–15.9)
Immature Grans (Abs): 0 10*3/uL (ref 0.0–0.1)
Immature Granulocytes: 0 %
Lymphocytes Absolute: 2.6 10*3/uL (ref 0.7–3.1)
Lymphs: 43 %
MCH: 31.8 pg (ref 26.6–33.0)
MCHC: 33.9 g/dL (ref 31.5–35.7)
MCV: 94 fL (ref 79–97)
Monocytes Absolute: 0.4 10*3/uL (ref 0.1–0.9)
Monocytes: 7 %
Neutrophils Absolute: 2.9 10*3/uL (ref 1.4–7.0)
Neutrophils: 47 %
Platelets: 330 10*3/uL (ref 150–450)
RBC: 4.24 x10E6/uL (ref 3.77–5.28)
RDW: 11.9 % (ref 11.7–15.4)
WBC: 6.1 10*3/uL (ref 3.4–10.8)

## 2020-05-04 LAB — COMPREHENSIVE METABOLIC PANEL
ALT: 17 IU/L (ref 0–32)
AST: 27 IU/L (ref 0–40)
Albumin/Globulin Ratio: 2.4 — ABNORMAL HIGH (ref 1.2–2.2)
Albumin: 5 g/dL — ABNORMAL HIGH (ref 3.7–4.7)
Alkaline Phosphatase: 67 IU/L (ref 44–121)
BUN/Creatinine Ratio: 14 (ref 12–28)
BUN: 12 mg/dL (ref 8–27)
Bilirubin Total: 0.2 mg/dL (ref 0.0–1.2)
CO2: 23 mmol/L (ref 20–29)
Calcium: 10 mg/dL (ref 8.7–10.3)
Chloride: 98 mmol/L (ref 96–106)
Creatinine, Ser: 0.83 mg/dL (ref 0.57–1.00)
GFR calc Af Amer: 82 mL/min/{1.73_m2} (ref >59)
GFR calc non Af Amer: 71 mL/min/{1.73_m2} (ref >59)
Globulin, Total: 2.1 g/dL (ref 1.5–4.5)
Glucose: 58 mg/dL — ABNORMAL LOW (ref 65–99)
Potassium: 3.9 mmol/L (ref 3.5–5.2)
Sodium: 142 mmol/L (ref 134–144)
Total Protein: 7.1 g/dL (ref 6.0–8.5)

## 2020-05-04 LAB — VITAMIN D 25 HYDROXY (VIT D DEFICIENCY, FRACTURES): Vit D, 25-Hydroxy: 48.6 ng/mL (ref 30.0–100.0)

## 2020-05-04 LAB — TSH: TSH: 1.26 u[IU]/mL (ref 0.450–4.500)

## 2020-05-04 LAB — LIPID PANEL W/O CHOL/HDL RATIO
Cholesterol, Total: 193 mg/dL (ref 100–199)
HDL: 91 mg/dL (ref >39)
LDL Chol Calc (NIH): 84 mg/dL (ref 0–99)
Triglycerides: 104 mg/dL (ref 0–149)
VLDL Cholesterol Cal: 18 mg/dL (ref 5–40)

## 2020-05-04 LAB — HEPATITIS C ANTIBODY: Hep C Virus Ab: <0.1 s/co ratio (ref 0.0–0.9)

## 2020-05-05 NOTE — Progress Notes (Signed)
Contacted via MyChart   Good evening Brandy Parker, your labs have returned with exception of drug screen which I will release to you once returned.  Hep C is negative.  Kidney and liver function are normal.  All remaining labs, including cholesterol look Keep being awesome!!  Thank you for allowing me to participate in your care. Kindest regards, Corrie Dandy'

## 2020-05-20 LAB — DRUG SCREEN 764883 11+OXYCO+ALC+CRT-BUND
Amphetamines, Urine: NEGATIVE ng/mL
Barbiturate: NEGATIVE ng/mL
Cannabinoid Quant, Ur: NEGATIVE ng/mL
Cocaine (Metabolite): NEGATIVE ng/mL
Creatinine: 108 mg/dL (ref 20.0–300.0)
Ethanol: NEGATIVE %
Meperidine: NEGATIVE ng/mL
Methadone Screen, Urine: NEGATIVE ng/mL
OPIATE SCREEN URINE: NEGATIVE ng/mL
Oxycodone/Oxymorphone, Urine: NEGATIVE ng/mL
Phencyclidine: NEGATIVE ng/mL
Propoxyphene: NEGATIVE ng/mL
Tramadol: NEGATIVE ng/mL
pH, Urine: 6.7 (ref 4.5–8.9)

## 2020-05-20 LAB — BENZODIAZEPINES CONFIRM, URINE: Benzodiazepines: NEGATIVE ng/mL

## 2020-05-22 ENCOUNTER — Ambulatory Visit: Payer: PPO

## 2020-05-27 ENCOUNTER — Ambulatory Visit (INDEPENDENT_AMBULATORY_CARE_PROVIDER_SITE_OTHER): Payer: PPO

## 2020-05-27 VITALS — Ht 62.0 in | Wt 107.0 lb

## 2020-05-27 DIAGNOSIS — Z0000 Encounter for general adult medical examination without abnormal findings: Secondary | ICD-10-CM | POA: Diagnosis not present

## 2020-05-27 NOTE — Progress Notes (Signed)
I connected with Brandy Parker today by telephone and verified that I am speaking with the correct person using two identifiers. Location patient: home Location provider: work Persons participating in the virtual visit: Brandy, [name].   I discussed the limitations, risks, security and privacy concerns of performing an evaluation and management service by telephone and the availability of in person appointments. I also discussed with the patient that there may be a patient responsible charge related to this service. The patient expressed understanding and verbally consented to this telephonic visit.    Interactive audio and video telecommunications were attempted between this provider and patient, however failed, due to patient having technical difficulties OR patient did not have access to video capability.  We continued and completed visit with audio only.     Vital signs may be patient reported or missing.  Subjective:   Brandy Parker is a 70 y.o. female who presents for Medicare Annual (Subsequent) preventive examination.  Review of Systems     Cardiac Risk Factors include: advanced age (>30men, >43 women);dyslipidemia;sedentary lifestyle;smoking/ tobacco exposure     Objective:    Today's Vitals   05/27/20 1116  Weight: 107 lb (48.5 kg)  Height: 5\' 2"  (1.575 m)   Body mass index is 19.57 kg/m.  Advanced Directives 05/27/2020 05/18/2019 07/20/2016 07/17/2016  Does Patient Have a Medical Advance Directive? No No No No  Would patient like information on creating a medical advance directive? - - No - Patient declined No - Patient declined    Current Medications (verified) Outpatient Encounter Medications as of 05/27/2020  Medication Sig  . ALPRAZolam (XANAX) 0.25 MG tablet Take 1 tablet (0.25 mg total) by mouth at bedtime as needed for anxiety.  . cholecalciferol (VITAMIN D3) 25 MCG (1000 UNIT) tablet Take 1,000 Units by mouth daily.  05/29/2020 escitalopram (LEXAPRO) 10  MG tablet Take 1 tablet (10 mg total) by mouth daily.  . Multiple Vitamin (MULTIVITAMIN) tablet Take 1 tablet by mouth daily.  . simvastatin (ZOCOR) 20 MG tablet Take 1 tablet (20 mg total) by mouth daily at 6 PM.   No facility-administered encounter medications on file as of 05/27/2020.    Allergies (verified) Sulfa antibiotics   History: Past Medical History:  Diagnosis Date  . Anxiety   . Hyperlipemia    Past Surgical History:  Procedure Laterality Date  . ORIF ANKLE FRACTURE Left 07/20/2016   Procedure: OPEN REDUCTION INTERNAL FIXATION (ORIF) ANKLE FRACTURE;  Surgeon: 07/22/2016, MD;  Location: [facility] ORS;  Service: Orthopedics;  Laterality: Left;   Family History  Problem Relation Age of Onset  . Hypertension Mother   . Heart failure Mother   . Hypertension Father   . Hyperlipidemia Father   . Cancer Father        bladder  . Heart failure Father   . Cancer Sister        bladder  . Breast cancer Neg Hx    Social History   Socioeconomic History  . Marital status: Single    Spouse name: Not on file  . Number of children: Not on file  . Years of education: Not on file  . Highest education level: Not on file  Occupational History  . Occupation: fulltime     Employer: [employer]  Tobacco Use  . Smoking status: Current Every Day Smoker    Packs/day: 0.25    Types: Cigarettes  . Smokeless tobacco: Never Used  Vaping Use  . Vaping Use: Never used  Substance and Sexual Activity  . Alcohol use: No  . Drug use: No  . Sexual activity: Not on file  Other Topics Concern  . Not on file  Social History Narrative  . Not on file   Social Determinants of Health   Financial Resource Strain: Low Risk   . Difficulty of Paying Living Expenses: Not hard at all  Food Insecurity: No Food Insecurity  . Worried About Programme researcher, broadcasting/film/video in the Last Year: Never true  . Ran Out of Food in the Last Year: Never true  Transportation Needs: No Transportation Needs  . Lack of  Transportation (Medical): No  . Lack of Transportation (Non-Medical): No  Physical Activity: Inactive  . Days of Exercise per Week: 0 days  . Minutes of Exercise per Session: 0 min  Stress: No Stress Concern Present  . Feeling of Stress : Not at all  Social Connections: Not on file    Tobacco Counseling Ready to quit: No Counseling given: Not Answered   Clinical Intake:  Pre-visit preparation completed: Yes  Pain : No/denies pain     Nutritional Status: BMI of 19-24  Normal Nutritional Risks: None Diabetes: No  How often do you need to have someone help you when you read instructions, pamphlets, or other written materials from your doctor or pharmacy?: 1 - Never What is the last grade level you completed in school?: 12th grade  Diabetic? no     Information entered by :: NAllen Parker   Activities of Daily Living In your present state of health, do you have any difficulty performing the following activities: 05/27/2020  Hearing? N  Vision? Y  Comment has cataracts  Difficulty concentrating or making decisions? N  Walking or climbing stairs? N  Dressing or bathing? N  Doing errands, shopping? N  Preparing Food and eating ? N  Using the Toilet? N  In the past six months, have you accidently leaked urine? N  Do you have problems with loss of bowel control? N  Managing your Medications? N  Managing your Finances? N  Housekeeping or managing your Housekeeping? N  Some recent data might be hidden    Patient Care Team: Marjie Skiff, NP as PCP - General (Nurse Practitioner)  Indicate any recent Medical Services you may have received from other than [facility] providers in the past year (date may be approximate).     Assessment:   This is a routine wellness examination for Brandy Parker.  Hearing/Vision screen No exam data present  Dietary issues and exercise activities discussed: Current Exercise Habits: The patient does not participate in regular exercise at  present  Goals    . Patient Stated     05/27/2020, no goals      Depression Screen PHQ 2/9 Scores 05/27/2020 05/03/2020 01/26/2020 10/17/2019 06/30/2019 05/18/2019 03/31/2019  PHQ - 2 Score 0 0 3 0 0 0 0  PHQ- 9 Score - 0 3 0 0 - 1    Fall Risk Fall Risk  05/27/2020 05/18/2019 03/31/2019 02/09/2019  Falls in the past year? 0 0 0 0  Number falls in past yr: - 0 0 0  Injury with Fall? - 0 0 0  Risk for fall due to : Medication side effect - - -  Follow up Falls evaluation completed;Education provided;Falls prevention discussed - Falls evaluation completed Falls evaluation completed    FALL RISK PREVENTION PERTAINING TO THE HOME:  Any stairs in or around the home? Yes  If so, are there  any without handrails? Yes  Home free of loose throw rugs in walkways, pet beds, electrical cords, etc? Yes  Adequate lighting in your home to reduce risk of falls? Yes   ASSISTIVE DEVICES UTILIZED TO PREVENT FALLS:  Life alert? No  Use of a cane, walker or w/c? No  Grab bars in the bathroom? No  Shower chair or bench in shower? No  Elevated toilet seat or a handicapped toilet? No   TIMED UP AND GO:  Was the test performed? No . .   Cognitive Function:     6CIT Screen 05/27/2020  What Year? 0 points  What month? 0 points  What time? 0 points  Count back from 20 0 points  Months in reverse 0 points  Repeat phrase 0 points  Total Score 0    Immunizations Immunization History  Administered Date(s) Administered  . Fluad Quad(high Dose 65+) 01/26/2020  . Influenza,inj,quad, With Preservative 01/02/2019  . PFIZER(Purple Top)SARS-COV-2 Vaccination 07/06/2019, 08/01/2019, 02/14/2020  . Pneumococcal Conjugate-13 03/31/2019  . Pneumococcal Polysaccharide-23 05/03/2020  . Tdap 12/15/2019    TDAP status: Up to date  Flu Vaccine status: Up to date  Pneumococcal vaccine status: Up to date  Covid-19 vaccine status: Completed vaccines  Qualifies for Shingles Vaccine? Yes   Zostavax completed  No   Shingrix Completed?: No.    Education has been provided regarding the importance of this vaccine. Patient has been advised to call insurance company to determine out of pocket expense if they have not yet received this vaccine. Advised may also receive vaccine at local pharmacy or Health Dept. Verbalized acceptance and understanding.  Screening Tests Health Maintenance  Topic Date Due  . COLONOSCOPY (Pts 45-90yrs Insurance coverage will need to be confirmed)  05/03/2021 (Originally 02/04/1994)  . MAMMOGRAM  04/13/2021  . TETANUS/TDAP  12/14/2029  . INFLUENZA VACCINE  Completed  . DEXA SCAN  Completed  . COVID-19 Vaccine  Completed  . Hepatitis C Screening  Completed  . PNA vac Low Risk Adult  Completed  . HPV VACCINES  Aged Out    Health Maintenance  There are no preventive care reminders to display for this patient.  Colorectal cancer screening: decline  Mammogram status: scheduled 06/13/2020  Bone Density status: Completed 02/01/2017.   Lung Cancer Screening: (Low Dose CT Chest recommended if Age 20-80 years, 30 pack-year currently smoking OR have quit w/in 15years.) does not qualify.   Lung Cancer Screening Referral: no  Additional Screening:  Hepatitis C Screening: does qualify; Completed 05/03/2020  Vision Screening: Recommended annual ophthalmology exams for early detection of glaucoma and other disorders of the eye. Is the patient up to date with their annual eye exam?  Yes  Who is the provider or what is the name of the office in which the patient attends annual eye exams? Dr. Clydene Pugh If pt is not established with a provider, would they like to be referred to a provider to establish care? No .   Dental Screening: Recommended annual dental exams for proper oral hygiene  Community Resource Referral / Chronic Care Management: CRR required this visit?  No   CCM required this visit?  No      Plan:     I have personally reviewed and noted the following in the  patient's chart:   . Medical and social history . Use of alcohol, tobacco or illicit drugs  . Current medications and supplements . Functional ability and status . Nutritional status . Physical activity . Advanced directives .  List of other physicians . Hospitalizations, surgeries, and ER visits in previous 12 months . Vitals . Screenings to include cognitive, depression, and falls . Referrals and appointments  In addition, I have reviewed and discussed with patient certain preventive protocols, quality metrics, and best practice recommendations. A written personalized care plan for preventive services as well as general preventive health recommendations were provided to patient.     Barb Merinoickeah E Jatniel Verastegui, Parker   [id number]/61/09603/21/2022   Nurse Notes:

## 2020-05-27 NOTE — Patient Instructions (Signed)
Brandy Parker , Thank you for taking time to come for your Medicare Wellness Visit. I appreciate your ongoing commitment to your health goals. Please review the following plan we discussed and let me know if I can assist you in the future.   Screening recommendations/referrals: Colonoscopy: decline Mammogram: scheduled 06/13/2020 Bone Density: completed 02/01/2017 Recommended yearly ophthalmology/optometry visit for glaucoma screening and checkup Recommended yearly dental visit for hygiene and checkup  Vaccinations: Influenza vaccine: completed 01/26/2020, due 10/07/2020 Pneumococcal vaccine: completed 05/03/2020 Tdap vaccine: completed 12/15/2019, due 12/14/2029 Shingles vaccine: discussed   Covid-19: 02/14/2020, 08/01/2019, 07/06/2019  Advanced directives: Advance directive discussed with you today.   Conditions/risks identified: smoking  Next appointment: Follow up in one year for your annual wellness visit    Preventive Care 72 Years and Older, Female Preventive care refers to lifestyle choices and visits with your health care provider that can promote health and wellness. What does preventive care include?  A yearly physical exam. This is also called an annual well check.  Dental exams once or twice a year.  Routine eye exams. Ask your health care provider how often you should have your eyes checked.  Personal lifestyle choices, including:  Daily care of your teeth and gums.  Regular physical activity.  Eating a healthy diet.  Avoiding tobacco and drug use.  Limiting alcohol use.  Practicing safe sex.  Taking low-dose aspirin every day.  Taking vitamin and mineral supplements as recommended by your health care provider. What happens during an annual well check? The services and screenings done by your health care provider during your annual well check will depend on your age, overall health, lifestyle risk factors, and family history of disease. Counseling  Your health  care provider may ask you questions about your:  Alcohol use.  Tobacco use.  Drug use.  Emotional well-being.  Home and relationship well-being.  Sexual activity.  Eating habits.  History of falls.  Memory and ability to understand (cognition).  Work and work Astronomer.  Reproductive health. Screening  You may have the following tests or measurements:  Height, weight, and BMI.  Blood pressure.  Lipid and cholesterol levels. These may be checked every 5 years, or more frequently if you are over 35 years old.  Skin check.  Lung cancer screening. You may have this screening every year starting at age 72 if you have a 30-pack-year history of smoking and currently smoke or have quit within the past 15 years.  Fecal occult blood test (FOBT) of the stool. You may have this test every year starting at age 72.  Flexible sigmoidoscopy or colonoscopy. You may have a sigmoidoscopy every 5 years or a colonoscopy every 10 years starting at age 72.  Hepatitis C blood test.  Hepatitis B blood test.  Sexually transmitted disease (STD) testing.  Diabetes screening. This is done by checking your blood sugar (glucose) after you have not eaten for a while (fasting). You may have this done every 1-3 years.  Bone density scan. This is done to screen for osteoporosis. You may have this done starting at age 65.  Mammogram. This may be done every 1-2 years. Talk to your health care provider about how often you should have regular mammograms. Talk with your health care provider about your test results, treatment options, and if necessary, the need for more tests. Vaccines  Your health care provider may recommend certain vaccines, such as:  Influenza vaccine. This is recommended every year.  Tetanus, diphtheria, and acellular pertussis (  Tdap, Td) vaccine. You may need a Td booster every 10 years.  Zoster vaccine. You may need this after age 50.  Pneumococcal 13-valent conjugate  (PCV13) vaccine. One dose is recommended after age 72.  Pneumococcal polysaccharide (PPSV23) vaccine. One dose is recommended after age 72. Talk to your health care provider about which screenings and vaccines you need and how often you need them. This information is not intended to replace advice given to you by your health care provider. Make sure you discuss any questions you have with your health care provider. Document Released: 03/22/2015 Document Revised: 11/13/2015 Document Reviewed: 12/25/2014 Elsevier Interactive Patient Education  2017 [place] Prevention in the Home Falls can cause injuries. They can happen to people of all ages. There are many things you can do to make your home safe and to help prevent falls. What can I do on the outside of my home?  Regularly fix the edges of walkways and driveways and fix any cracks.  Remove anything that might make you trip as you walk through a door, such as a raised step or threshold.  Trim any bushes or trees on the path to your home.  Use bright outdoor lighting.  Clear any walking paths of anything that might make someone trip, such as rocks or tools.  Regularly check to see if handrails are loose or broken. Make sure that both sides of any steps have handrails.  Any raised decks and porches should have guardrails on the edges.  Have any leaves, snow, or ice cleared regularly.  Use sand or salt on walking paths during winter.  Clean up any spills in your garage right away. This includes oil or grease spills. What can I do in the bathroom?  Use night lights.  Install grab bars by the toilet and in the tub and shower. Do not use towel bars as grab bars.  Use non-skid mats or decals in the tub or shower.  If you need to sit down in the shower, use a plastic, non-slip stool.  Keep the floor dry. Clean up any water that spills on the floor as soon as it happens.  Remove soap buildup in the tub or shower  regularly.  Attach bath mats securely with double-sided non-slip rug tape.  Do not have throw rugs and other things on the floor that can make you trip. What can I do in the bedroom?  Use night lights.  Make sure that you have a light by your bed that is easy to reach.  Do not use any sheets or blankets that are too big for your bed. They should not hang down onto the floor.  Have a firm chair that has side arms. You can use this for support while you get dressed.  Do not have throw rugs and other things on the floor that can make you trip. What can I do in the kitchen?  Clean up any spills right away.  Avoid walking on wet floors.  Keep items that you use a lot in easy-to-reach places.  If you need to reach something above you, use a strong step stool that has a grab bar.  Keep electrical cords out of the way.  Do not use floor polish or wax that makes floors slippery. If you must use wax, use non-skid floor wax.  Do not have throw rugs and other things on the floor that can make you trip. What can I do with my stairs?  Do  not leave any items on the stairs.  Make sure that there are handrails on both sides of the stairs and use them. Fix handrails that are broken or loose. Make sure that handrails are as long as the stairways.  Check any carpeting to make sure that it is firmly attached to the stairs. Fix any carpet that is loose or worn.  Avoid having throw rugs at the top or bottom of the stairs. If you do have throw rugs, attach them to the floor with carpet tape.  Make sure that you have a light switch at the top of the stairs and the bottom of the stairs. If you do not have them, ask someone to add them for you. What else can I do to help prevent falls?  Wear shoes that:  Do not have high heels.  Have rubber bottoms.  Are comfortable and fit you well.  Are closed at the toe. Do not wear sandals.  If you use a stepladder:  Make sure that it is fully  opened. Do not climb a closed stepladder.  Make sure that both sides of the stepladder are locked into place.  Ask someone to hold it for you, if possible.  Clearly mark and make sure that you can see:  Any grab bars or handrails.  First and last steps.  Where the edge of each step is.  Use tools that help you move around (mobility aids) if they are needed. These include:  Canes.  Walkers.  Scooters.  Crutches.  Turn on the lights when you go into a dark area. Replace any light bulbs as soon as they burn out.  Set up your furniture so you have a clear path. Avoid moving your furniture around.  If any of your floors are uneven, fix them.  If there are any pets around you, be aware of where they are.  Review your medicines with your doctor. Some medicines can make you feel dizzy. This can increase your chance of falling. Ask your doctor what other things that you can do to help prevent falls. This information is not intended to replace advice given to you by your health care provider. Make sure you discuss any questions you have with your health care provider. Document Released: 12/20/2008 Document Revised: 08/01/2015 Document Reviewed: 03/30/2014 Elsevier Interactive Patient Education  2017 [employer].

## 2020-06-13 ENCOUNTER — Ambulatory Visit
Admission: RE | Admit: 2020-06-13 | Discharge: 2020-06-13 | Disposition: A | Discharge status: Home / Self Care | Payer: PPO | Source: Ambulatory Visit | Attending: Nurse Practitioner | Admitting: Nurse Practitioner

## 2020-06-13 ENCOUNTER — Other Ambulatory Visit: Payer: Self-pay

## 2020-06-13 DIAGNOSIS — Z1231 Encounter for screening mammogram for malignant neoplasm of breast: Secondary | ICD-10-CM | POA: Insufficient documentation

## 2020-07-31 ENCOUNTER — Encounter: Payer: Self-pay | Admitting: Nurse Practitioner

## 2020-07-31 ENCOUNTER — Ambulatory Visit (INDEPENDENT_AMBULATORY_CARE_PROVIDER_SITE_OTHER): Payer: PPO | Admitting: Nurse Practitioner

## 2020-07-31 ENCOUNTER — Other Ambulatory Visit: Payer: Self-pay

## 2020-07-31 VITALS — BP 128/73 | HR 71 | Temp 99.1°F | Wt 107.6 lb

## 2020-07-31 DIAGNOSIS — Z79899 Other long term (current) drug therapy: Secondary | ICD-10-CM | POA: Diagnosis not present

## 2020-07-31 DIAGNOSIS — F17210 Nicotine dependence, cigarettes, uncomplicated: Secondary | ICD-10-CM | POA: Diagnosis not present

## 2020-07-31 DIAGNOSIS — F411 Generalized anxiety disorder: Secondary | ICD-10-CM | POA: Diagnosis not present

## 2020-07-31 MED ORDER — ALPRAZOLAM 0.25 MG PO TABS: 0.2500 mg | ORAL_TABLET | Freq: Every evening | ORAL | 0 refills | Status: DC | PRN

## 2020-07-31 NOTE — Progress Notes (Signed)
.cfpb   BP 128/73 (BP Location: Left Arm, Cuff Size: Normal)   Pulse 71   Temp 99.1 F (37.3 C) (Oral)   Wt 107 lb 9.6 oz (48.8 kg)   SpO2 98%   BMI 19.68 kg/m    Subjective:    Patient ID: Brandy Parker, female    DOB: 1950-08-20, 70 y.o.   MRN: [id number]  HPI: Brandy Parker is a 70 y.o. female  Chief Complaint  Patient presents with  . Anxiety    Patient is here for follow up for anxiety. Patient states she does not have any concerns or questions at today's visit.   ANXIETY/STRESS Current medications includeXanax 0.25 MG PRN and Lexapro 10 MG daily. Has been on Xanax for a long while per her report, several years(2008). Pt is aware of risks ofbenzomedication use to include increased sedation, respiratory suppression, falls, dependence and cardiovascular events. Ptwould like to continue treatment as benefit determined to outweigh risk.On PMP review her last fill Xanax was 07/22/20 for 90 day supply. Previously got 90 day supply and would like to continue this.  Continues on Lexapro.  Duration:stable Anxious mood:no Excessive worrying:no Irritability:no Sweating:no Nausea:no Palpitations:no Hyperventilation:no Panic attacks:no Agoraphobia:no Obscessions/compulsions:no Depressed mood:no [facility] screen [facility] 07/31/2020 05/27/2020 05/03/2020 01/26/2020 10/17/2019  Decreased Interest 0 0 0 3 0  Down, Depressed, Hopeless 0 0 0 0 0  PHQ - 2 Score 0 0 0 3 0  Altered sleeping 0 - 0 0 0  Tired, decreased energy 0 - 0 0 0  Change in appetite 0 - 0 0 0  Feeling bad or failure about yourself  0 - 0 0 0  Trouble concentrating 0 - 0 0 0  Moving slowly or fidgety/restless 0 - 0 0 0  Suicidal thoughts 0 - 0 0 0  PHQ-9 Score 0 - 0 3 0  Difficult doing work/chores Not difficult at all - - Not difficult at all Not difficult at all   GAD 7 : Generalized Anxiety Score 07/31/2020 05/03/2020 01/26/2020 10/17/2019  Nervous, Anxious, on Edge 0 0 0 0  Control/stop  worrying 0 0 0 0  Worry too much - different things 0 0 0 0  Trouble relaxing 0 0 0 0  Restless 0 0 0 0  Easily annoyed or irritable 0 0 0 0  Afraid - awful might happen 0 0 0 0  Total GAD 7 Score 0 0 0 0  Anxiety Difficulty - Not difficult at all - Not difficult at all   GAD 7 : Generalized Anxiety Score 07/31/2020 05/03/2020 01/26/2020 10/17/2019  Nervous, Anxious, on Edge 0 0 0 0  Control/stop worrying 0 0 0 0  Worry too much - different things 0 0 0 0  Trouble relaxing 0 0 0 0  Restless 0 0 0 0  Easily annoyed or irritable 0 0 0 0  Afraid - awful might happen 0 0 0 0  Total GAD 7 Score 0 0 0 0  Anxiety Difficulty - Not difficult at all - Not difficult at all    Relevant past medical, surgical, family and social history reviewed and updated as indicated. Interim medical history since our last visit reviewed. Allergies and medications reviewed and updated.  Review of Systems  Constitutional: Negative for activity change, appetite change, diaphoresis, fatigue and fever.  Respiratory: Negative for cough, chest tightness and shortness of breath.   Cardiovascular: Negative for chest pain, palpitations and leg swelling.  Gastrointestinal: Negative.   Neurological: Negative.  Psychiatric/Behavioral: Negative for decreased concentration, self-injury, sleep disturbance and suicidal ideas. The patient is not nervous/anxious.     Per HPI unless specifically indicated above     Objective:    BP 128/73 (BP Location: Left Arm, Cuff Size: Normal)   Pulse 71   Temp 99.1 F (37.3 C) (Oral)   Wt 107 lb 9.6 oz (48.8 kg)   SpO2 98%   BMI 19.68 kg/m   Wt Readings from Last 3 Encounters:  07/31/20 107 lb 9.6 oz (48.8 kg)  05/27/20 107 lb (48.5 kg)  05/03/20 107 lb (48.5 kg)    Physical Exam Vitals and nursing note reviewed.  Constitutional:      General: She is awake. She is not in acute distress.    Appearance: She is well-developed and well-groomed. She is not ill-appearing.   HENT:     Head: Normocephalic.     Right Ear: Hearing normal.     Left Ear: Hearing normal.  Eyes:     General: Lids are normal.        Right eye: No discharge.        Left eye: No discharge.     Conjunctiva/sclera: Conjunctivae normal.     Pupils: Pupils are equal, round, and reactive to light.  Neck:     Vascular: No carotid bruit.  Cardiovascular:     Rate and Rhythm: Normal rate and regular rhythm.     Heart sounds: Normal heart sounds. No murmur heard. No gallop.   Pulmonary:     Effort: Pulmonary effort is normal. No accessory muscle usage or respiratory distress.     Breath sounds: Normal breath sounds.  Abdominal:     General: Bowel sounds are normal.     Palpations: Abdomen is soft.  Musculoskeletal:     Cervical back: Normal range of motion and neck supple.     Right lower leg: No edema.     Left lower leg: No edema.  Skin:    General: Skin is warm and dry.  Neurological:     Mental Status: She is alert and oriented to person, place, and time.  Psychiatric:        Attention and Perception: Attention normal.        Mood and Affect: Mood normal.        Speech: Speech normal.        Behavior: Behavior normal. Behavior is cooperative.        Thought Content: Thought content normal.     Results for orders placed or performed in visit on 05/03/20  [id number] 11+Oxyco+Alc+Crt-Bund  Result Value Ref Range   Ethanol Negative Cutoff=0.020 %   Amphetamines, Urine Negative Cutoff=1000 ng/mL   Barbiturate Negative Cutoff=200 ng/mL   BENZODIAZ UR QL See Final Results Cutoff=200 ng/mL   Cannabinoid Quant, Ur Negative Cutoff=50 ng/mL   Cocaine (Metabolite) Negative Cutoff=300 ng/mL   OPIATE SCREEN URINE Negative Cutoff=300 ng/mL   Oxycodone/Oxymorphone, Urine Negative Cutoff=300 ng/mL   Phencyclidine Negative Cutoff=25 ng/mL   Methadone Screen, Urine Negative Cutoff=300 ng/mL   Propoxyphene Negative Cutoff=300 ng/mL   Meperidine Negative Cutoff=200 ng/mL   Tramadol  Negative Cutoff=200 ng/mL   Creatinine 108.0 20.0 - 300.0 mg/dL   pH, Urine 6.7 4.5 - 8.9  Comprehensive metabolic panel  Result Value Ref Range   Glucose 58 (L) 65 - 99 mg/dL   BUN 12 8 - 27 mg/dL   Creatinine, Ser 0.45 0.57 - 1.00 mg/dL   GFR calc non Af Amer 71 >59  mL/min/1.73   GFR calc Af Amer 82 >59 mL/min/1.73   BUN/Creatinine Ratio 14 12 - 28   Sodium 142 134 - 144 mmol/L   Potassium 3.9 3.5 - 5.2 mmol/L   Chloride 98 96 - 106 mmol/L   CO2 23 20 - 29 mmol/L   Calcium 10.0 8.7 - 10.3 mg/dL   Total Protein 7.1 6.0 - 8.5 g/dL   Albumin 5.0 (H) 3.7 - 4.7 g/dL   Globulin, Total 2.1 1.5 - 4.5 g/dL   Albumin/Globulin Ratio 2.4 (H) 1.2 - 2.2   Bilirubin Total 0.2 0.0 - 1.2 mg/dL   Alkaline Phosphatase 67 44 - 121 IU/L   AST 27 0 - 40 IU/L   ALT 17 0 - 32 IU/L  CBC with Differential/Platelet  Result Value Ref Range   WBC 6.1 3.4 - 10.8 x10E3/uL   RBC 4.24 3.77 - 5.28 x10E6/uL   Hemoglobin 13.5 11.1 - 15.9 g/dL   Hematocrit 03.8 88.2 - 46.6 %   MCV 94 79 - 97 fL   MCH 31.8 26.6 - 33.0 pg   MCHC 33.9 31.5 - 35.7 g/dL   RDW 80.0 34.9 - 17.9 %   Platelets 330 150 - 450 x10E3/uL   Neutrophils 47 Not Estab. %   Lymphs 43 Not Estab. %   Monocytes 7 Not Estab. %   Eos 2 Not Estab. %   Basos 1 Not Estab. %   Neutrophils Absolute 2.9 1.4 - 7.0 x10E3/uL   Lymphocytes Absolute 2.6 0.7 - 3.1 x10E3/uL   Monocytes Absolute 0.4 0.1 - 0.9 x10E3/uL   EOS (ABSOLUTE) 0.1 0.0 - 0.4 x10E3/uL   Basophils Absolute 0.1 0.0 - 0.2 x10E3/uL   Immature Granulocytes 0 Not Estab. %   Immature Grans (Abs) 0.0 0.0 - 0.1 x10E3/uL  Lipid Panel w/o Chol/HDL Ratio  Result Value Ref Range   Cholesterol, Total 193 100 - 199 mg/dL   Triglycerides 150 0 - 149 mg/dL   HDL 91 >56 mg/dL   VLDL Cholesterol Cal 18 5 - 40 mg/dL   LDL Chol Calc (NIH) 84 0 - 99 mg/dL  TSH  Result Value Ref Range   TSH 1.260 0.450 - 4.500 uIU/mL  VITAMIN D 25 Hydroxy (Vit-D Deficiency, Fractures)  Result Value Ref Range    Vit D, 25-Hydroxy 48.6 30.0 - 100.0 ng/mL  Hepatitis C antibody  Result Value Ref Range   Hep C Virus Ab <0.1 0.0 - 0.9 s/co ratio  Benzodiazepines Confirm, Urine  Result Value Ref Range   Benzodiazepines Negative Cutoff=100 ng/mL      Assessment & Plan:   Problem List Items Addressed This Visit      Other   Generalized anxiety disorder - Primary    Chronic, stable.  Only taking Xanax 0.25 MG at night for anxiety and rest.  Have sent in refill, 0.25 MG QHS #90 tablets with 0 refills.  Had discussion about BEERS criteria and risks with these.   Will continue to monitor, she is up to date on contract and UDS.  Continue Lexapro.  She agrees to every 3 month visits, will return in 3 months.      Relevant Medications   ALPRAZolam (XANAX) 0.25 MG tablet (Start on 10/21/2020)   Long-term current use of benzodiazepine    Since 2008, Xanax.  Discussed BEERS criteria and risks today, she wishes to continue current regimen and agrees with every 3 month visits.  UDS and controlled substance contract up to date.  Nicotine dependence, cigarettes, uncomplicated    I have recommended complete cessation of tobacco use. I have discussed various options available for assistance with tobacco cessation including over the counter methods (Nicotine gum, patch and lozenges). We also discussed prescription options (Chantix, Nicotine Inhaler / Nasal Spray). The patient is not interested in pursuing any prescription tobacco cessation options at this time.           Follow up plan: Return in about 3 months (around 10/31/2020) for Anxiety and medication refill.

## 2020-07-31 NOTE — Assessment & Plan Note (Signed)
Chronic, stable.  Only taking Xanax 0.25 MG at night for anxiety and rest.  Have sent in refill, 0.25 MG QHS #90 tablets with 0 refills.  Had discussion about BEERS criteria and risks with these.   Will continue to monitor, she is up to date on contract and UDS.  Continue Lexapro.  She agrees to every 3 month visits, will return in 3 months.

## 2020-07-31 NOTE — Assessment & Plan Note (Signed)
I have recommended complete cessation of tobacco use. I have discussed various options available for assistance with tobacco cessation including over the counter methods (Nicotine gum, patch and lozenges). We also discussed prescription options (Chantix, Nicotine Inhaler / Nasal Spray). The patient is not interested in pursuing any prescription tobacco cessation options at this time.  

## 2020-07-31 NOTE — Patient Instructions (Signed)
[email or web address].NIH.Gov">  Generalized Anxiety Disorder, Adult Generalized anxiety disorder (GAD) is a mental health condition. Unlike normal worries, anxiety related to GAD is not triggered by a specific event. These worries do not fade or get better with time. GAD interferes with relationships, work, and school. GAD symptoms can vary from mild to severe. People with severe GAD can have intense waves of anxiety with physical symptoms that are similar to panic attacks. What are the causes? The exact cause of GAD is not known, but the following are believed to have an impact:  Differences in natural brain chemicals.  Genes passed down from parents to children.  Differences in the way threats are perceived.  Development during childhood.  Personality. What increases the risk? The following factors may make you more likely to develop this condition:  Being female.  Having a family history of anxiety disorders.  Being very shy.  Experiencing very stressful life events, such as the death of a loved one.  Having a very stressful family environment. What are the signs or symptoms? People with GAD often worry excessively about many things in their lives, such as their health and family. Symptoms may also include:  Mental and emotional symptoms: ? Worrying excessively about natural disasters. ? Fear of being late. ? Difficulty concentrating. ? Fears that others are judging your performance.  Physical symptoms: ? Fatigue. ? Headaches, muscle tension, muscle twitches, trembling, or feeling shaky. ? Feeling like your heart is pounding or beating very fast. ? Feeling out of breath or like you cannot take a deep breath. ? Having trouble falling asleep or staying asleep, or experiencing restlessness. ? Sweating. ? Nausea, diarrhea, or irritable bowel syndrome (IBS).  Behavioral symptoms: ? Experiencing erratic moods or irritability. ? Avoidance of new situations. ? Avoidance of  people. ? Extreme difficulty making decisions. How is this diagnosed? This condition is diagnosed based on your symptoms and medical history. You will also have a physical exam. Your health care provider may perform tests to rule out other possible causes of your symptoms. To be diagnosed with GAD, a person must have anxiety that:  Is out of his or her control.  Affects several different aspects of his or her life, such as work and relationships.  Causes distress that makes him or her unable to take part in normal activities.  Includes at least three symptoms of GAD, such as restlessness, fatigue, trouble concentrating, irritability, muscle tension, or sleep problems. Before your health care provider can confirm a diagnosis of GAD, these symptoms must be present more days than they are not, and they must last for 6 months or longer. How is this treated? This condition may be treated with:  Medicine. Antidepressant medicine is usually prescribed for long-term daily control. Anti-anxiety medicines may be added in severe cases, especially when panic attacks occur.  Talk therapy (psychotherapy). Certain types of talk therapy can be helpful in treating GAD by providing support, education, and guidance. Options include: ? Cognitive behavioral therapy (CBT). People learn coping skills and self-calming techniques to ease their physical symptoms. They learn to identify unrealistic thoughts and behaviors and to replace them with more appropriate thoughts and behaviors. ? Acceptance and commitment therapy (ACT). This treatment teaches people how to be mindful as a way to cope with unwanted thoughts and feelings. ? Biofeedback. This process trains you to manage your body's response (physiological response) through breathing techniques and relaxation methods. You will work with a therapist while machines are used to monitor your physical   symptoms.  Stress management techniques. These include yoga,  meditation, and exercise. A mental health specialist can help determine which treatment is best for you. Some people see improvement with one type of therapy. However, other people require a combination of therapies.   Follow these instructions at home: Lifestyle  Maintain a consistent routine and schedule.  Anticipate stressful situations. Create a plan, and allow extra time to work with your plan.  Practice stress management or self-calming techniques that you have learned from your therapist or your health care provider. General instructions  Take over-the-counter and prescription medicines only as told by your health care provider.  Understand that you are likely to have setbacks. Accept this and be kind to yourself as you persist to take better care of yourself.  Recognize and accept your accomplishments, even if you judge them as small.  Keep all follow-up visits as told by your health care provider. This is important. Contact a health care provider if:  Your symptoms do not get better.  Your symptoms get worse.  You have signs of depression, such as: ? A persistently sad or irritable mood. ? Loss of enjoyment in activities that used to bring you joy. ? Change in weight or eating. ? Changes in sleeping habits. ? Avoiding friends or family members. ? Loss of energy for normal tasks. ? Feelings of guilt or worthlessness. Get help right away if:  You have serious thoughts about hurting yourself or others. If you ever feel like you may hurt yourself or others, or have thoughts about taking your own life, get help right away. Go to your nearest emergency department or:  Call your local emergency services (911 in the [place]  Call a suicide crisis helpline, such as the National Suicide Prevention Lifeline at 1-800-273-8255. This is open 24 hours a day in the [place]  Text the Crisis Text Line at 741741 (in the [place] Summary  Generalized anxiety disorder (GAD) is a mental  health condition that involves worry that is not triggered by a specific event.  People with GAD often worry excessively about many things in their lives, such as their health and family.  GAD may cause symptoms such as restlessness, trouble concentrating, sleep problems, frequent sweating, nausea, diarrhea, headaches, and trembling or muscle twitching.  A mental health specialist can help determine which treatment is best for you. Some people see improvement with one type of therapy. However, other people require a combination of therapies. This information is not intended to replace advice given to you by your health care provider. Make sure you discuss any questions you have with your health care provider. Document Revised: 12/14/2018 Document Reviewed: 12/14/2018 Elsevier Patient Education  2021 [employer]  

## 2020-07-31 NOTE — Assessment & Plan Note (Signed)
Since 2008, Xanax.  Discussed BEERS criteria and risks today, she wishes to continue current regimen and agrees with every 3 month visits.  UDS and controlled substance contract up to date.

## 2020-08-20 DIAGNOSIS — H40013 Open angle with borderline findings, low risk, bilateral: Secondary | ICD-10-CM | POA: Diagnosis not present

## 2020-08-20 DIAGNOSIS — H35039 Hypertensive retinopathy, unspecified eye: Secondary | ICD-10-CM | POA: Diagnosis not present

## 2020-08-20 DIAGNOSIS — H2510 Age-related nuclear cataract, unspecified eye: Secondary | ICD-10-CM | POA: Diagnosis not present

## 2020-11-01 ENCOUNTER — Encounter: Payer: Self-pay | Admitting: Nurse Practitioner

## 2020-11-01 ENCOUNTER — Ambulatory Visit (INDEPENDENT_AMBULATORY_CARE_PROVIDER_SITE_OTHER): Payer: PPO | Admitting: Nurse Practitioner

## 2020-11-01 ENCOUNTER — Other Ambulatory Visit: Payer: Self-pay

## 2020-11-01 VITALS — BP 132/76 | HR 67 | Temp 99.5°F | Wt 110.8 lb

## 2020-11-01 DIAGNOSIS — Z79899 Other long term (current) drug therapy: Secondary | ICD-10-CM | POA: Diagnosis not present

## 2020-11-01 DIAGNOSIS — F411 Generalized anxiety disorder: Secondary | ICD-10-CM

## 2020-11-01 DIAGNOSIS — D692 Other nonthrombocytopenic purpura: Secondary | ICD-10-CM | POA: Diagnosis not present

## 2020-11-01 MED ORDER — SIMVASTATIN 20 MG PO TABS: 20.0000 mg | ORAL_TABLET | Freq: Every day | ORAL | 4 refills | Status: DC

## 2020-11-01 MED ORDER — ESCITALOPRAM OXALATE 10 MG PO TABS: 10.0000 mg | ORAL_TABLET | Freq: Every day | ORAL | 4 refills | Status: DC

## 2020-11-01 NOTE — Progress Notes (Signed)
.cfpb   BP 132/76 (BP Location: Left Arm, Cuff Size: Normal)   Pulse 67   Temp 99.5 F (37.5 C) (Oral)   Wt 110 lb 12.8 oz (50.3 kg)   SpO2 95%   BMI 20.27 kg/m    Subjective:    Patient ID: Brandy Parker, female    DOB: [birth year], 70 y.o.   MRN: [id number]  HPI: Brandy Parker is a 70 y.o. female  Chief Complaint  Patient presents with   Anxiety   Medication Refill   ANXIETY/STRESS Current medications include Xanax 0.25 MG PRN and Lexapro 10 MG daily. Has been on Xanax for a long while per her report, several years (2008).  Pt is aware of risks of benzo medication use to include increased sedation, respiratory suppression, falls, dependence and cardiovascular events. Pt would like to continue treatment as benefit determined to outweigh risk.  On PDMP review her last fill Xanax was 10/22/20 for 90 day supply. Previously got 90 day supply and would like to continue this.  Continues on Lexapro.  Duration:stable Anxious mood: no  Excessive worrying: no Irritability: no  Sweating: no Nausea: no Palpitations:no Hyperventilation: no Panic attacks: no Agoraphobia: no  Obscessions/compulsions: no Depressed mood: no Depression screen [facility] 2/9 11/01/2020 07/31/2020 05/27/2020 05/03/2020 01/26/2020  Decreased Interest 0 0 0 0 3  Down, Depressed, Hopeless 0 0 0 0 0  PHQ - 2 Score 0 0 0 0 3  Altered sleeping 3 0 - 0 0  Tired, decreased energy 0 0 - 0 0  Change in appetite 0 0 - 0 0  Feeling bad or failure about yourself  0 0 - 0 0  Trouble concentrating 0 0 - 0 0  Moving slowly or fidgety/restless 0 0 - 0 0  Suicidal thoughts 0 0 - 0 0  PHQ-9 Score 3 0 - 0 3  Difficult doing work/chores Not difficult at all Not difficult at all - - Not difficult at all  Some recent data might be hidden   GAD 7 : Generalized Anxiety Score 11/01/2020 07/31/2020 05/03/2020 01/26/2020  Nervous, Anxious, on Edge 0 0 0 0  Control/stop worrying 0 0 0 0  Worry too much - different things 0 0 0 0  Trouble  relaxing 0 0 0 0  Restless 3 0 0 0  Easily annoyed or irritable 0 0 0 0  Afraid - awful might happen 0 0 0 0  Total GAD 7 Score 3 0 0 0  Anxiety Difficulty Not difficult at all - Not difficult at all -    Relevant past medical, surgical, family and social history reviewed and updated as indicated. Interim medical history since our last visit reviewed. Allergies and medications reviewed and updated.  Review of Systems  Constitutional:  Negative for activity change, appetite change, diaphoresis, fatigue and fever.  Respiratory:  Negative for cough, chest tightness and shortness of breath.   Cardiovascular:  Negative for chest pain, palpitations and leg swelling.  Gastrointestinal: Negative.   Neurological: Negative.   Psychiatric/Behavioral:  Negative for decreased concentration, self-injury, sleep disturbance and suicidal ideas. The patient is not nervous/anxious.    Per HPI unless specifically indicated above     Objective:    BP 132/76 (BP Location: Left Arm, Cuff Size: Normal)   Pulse 67   Temp 99.5 F (37.5 C) (Oral)   Wt 110 lb 12.8 oz (50.3 kg)   SpO2 95%   BMI 20.27 kg/m   Wt Readings from Last 3 Encounters:  11/01/20 110 lb 12.8 oz (50.3 kg)  07/31/20 107 lb 9.6 oz (48.8 kg)  05/27/20 107 lb (48.5 kg)    Physical Exam Vitals and nursing note reviewed.  Constitutional:      General: She is awake. She is not in acute distress.    Appearance: She is well-developed and well-groomed. She is not ill-appearing.  HENT:     Head: Normocephalic.     Right Ear: Hearing normal.     Left Ear: Hearing normal.  Eyes:     General: Lids are normal.        Right eye: No discharge.        Left eye: No discharge.     Conjunctiva/sclera: Conjunctivae normal.     Pupils: Pupils are equal, round, and reactive to light.  Neck:     Vascular: No carotid bruit.  Cardiovascular:     Rate and Rhythm: Normal rate and regular rhythm.     Heart sounds: Normal heart sounds. No murmur  heard.   No gallop.  Pulmonary:     Effort: Pulmonary effort is normal. No accessory muscle usage or respiratory distress.     Breath sounds: Normal breath sounds.  Abdominal:     General: Bowel sounds are normal.     Palpations: Abdomen is soft.  Musculoskeletal:     Cervical back: Normal range of motion and neck supple.     Right lower leg: No edema.     Left lower leg: No edema.  Skin:    General: Skin is warm and dry.       Neurological:     Mental Status: She is alert and oriented to person, place, and time.  Psychiatric:        Attention and Perception: Attention normal.        Mood and Affect: Mood normal.        Speech: Speech normal.        Behavior: Behavior normal. Behavior is cooperative.        Thought Content: Thought content normal.    Results for orders placed or performed in visit on 05/03/20  [id number] 11+Oxyco+Alc+Crt-Bund  Result Value Ref Range   Ethanol Negative Cutoff=0.020 %   Amphetamines, Urine Negative Cutoff=1000 ng/mL   Barbiturate Negative Cutoff=200 ng/mL   BENZODIAZ UR QL See Final Results Cutoff=200 ng/mL   Cannabinoid Quant, Ur Negative Cutoff=50 ng/mL   Cocaine (Metabolite) Negative Cutoff=300 ng/mL   OPIATE SCREEN URINE Negative Cutoff=300 ng/mL   Oxycodone/Oxymorphone, Urine Negative Cutoff=300 ng/mL   Phencyclidine Negative Cutoff=25 ng/mL   Methadone Screen, Urine Negative Cutoff=300 ng/mL   Propoxyphene Negative Cutoff=300 ng/mL   Meperidine Negative Cutoff=200 ng/mL   Tramadol Negative Cutoff=200 ng/mL   Creatinine 108.0 20.0 - 300.0 mg/dL   pH, Urine 6.7 4.5 - 8.9  Comprehensive metabolic panel  Result Value Ref Range   Glucose 58 (L) 65 - 99 mg/dL   BUN 12 8 - 27 mg/dL   Creatinine, Ser 0.34 0.57 - 1.00 mg/dL   GFR calc non Af Amer 71 >59 mL/min/1.73   GFR calc Af Amer 82 >59 mL/min/1.73   BUN/Creatinine Ratio 14 12 - 28   Sodium 142 134 - 144 mmol/L   Potassium 3.9 3.5 - 5.2 mmol/L   Chloride 98 96 - 106 mmol/L   CO2 23  20 - 29 mmol/L   Calcium 10.0 8.7 - 10.3 mg/dL   Total Protein 7.1 6.0 - 8.5 g/dL   Albumin 5.0 (H) 3.7 - 4.7  g/dL   Globulin, Total 2.1 1.5 - 4.5 g/dL   Albumin/Globulin Ratio 2.4 (H) 1.2 - 2.2   Bilirubin Total 0.2 0.0 - 1.2 mg/dL   Alkaline Phosphatase 67 44 - 121 IU/L   AST 27 0 - 40 IU/L   ALT 17 0 - 32 IU/L  CBC with Differential/Platelet  Result Value Ref Range   WBC 6.1 3.4 - 10.8 x10E3/uL   RBC 4.24 3.77 - 5.28 x10E6/uL   Hemoglobin 13.5 11.1 - 15.9 g/dL   Hematocrit 40.8 14.4 - 46.6 %   MCV 94 79 - 97 fL   MCH 31.8 26.6 - 33.0 pg   MCHC 33.9 31.5 - 35.7 g/dL   RDW 81.8 56.3 - 14.9 %   Platelets 330 150 - 450 x10E3/uL   Neutrophils 47 Not Estab. %   Lymphs 43 Not Estab. %   Monocytes 7 Not Estab. %   Eos 2 Not Estab. %   Basos 1 Not Estab. %   Neutrophils Absolute 2.9 1.4 - 7.0 x10E3/uL   Lymphocytes Absolute 2.6 0.7 - 3.1 x10E3/uL   Monocytes Absolute 0.4 0.1 - 0.9 x10E3/uL   EOS (ABSOLUTE) 0.1 0.0 - 0.4 x10E3/uL   Basophils Absolute 0.1 0.0 - 0.2 x10E3/uL   Immature Granulocytes 0 Not Estab. %   Immature Grans (Abs) 0.0 0.0 - 0.1 x10E3/uL  Lipid Panel w/o Chol/HDL Ratio  Result Value Ref Range   Cholesterol, Total 193 100 - 199 mg/dL   Triglycerides 702 0 - 149 mg/dL   HDL 91 >63 mg/dL   VLDL Cholesterol Cal 18 5 - 40 mg/dL   LDL Chol Calc (NIH) 84 0 - 99 mg/dL  TSH  Result Value Ref Range   TSH 1.260 0.450 - 4.500 uIU/mL  VITAMIN D 25 Hydroxy (Vit-D Deficiency, Fractures)  Result Value Ref Range   Vit D, 25-Hydroxy 48.6 30.0 - 100.0 ng/mL  Hepatitis C antibody  Result Value Ref Range   Hep C Virus Ab <0.1 0.0 - 0.9 s/co ratio  Benzodiazepines Confirm, Urine  Result Value Ref Range   Benzodiazepines Negative Cutoff=100 ng/mL      Assessment & Plan:   Problem List Items Addressed This Visit       Cardiovascular and Mediastinum   Senile purpura (HCC) - Primary    Noted on exam, to right hand.  Recommend monitor skin closely for breakdown and alert  provider if present.  Gentle skin care at home.  Consider CBC next visit.      Relevant Medications   simvastatin (ZOCOR) 20 MG tablet     Other   Generalized anxiety disorder    Chronic, stable.  Only taking Xanax 0.25 MG at night for anxiety and rest.  Have sent in refill and she obtained 10/22/20, 0.25 MG QHS #90 tablets with 0 refills.  Had discussion about BEERS criteria and risks with these.   Will continue to monitor, she is up to date on contract and UDS.  Continue Lexapro.  She agrees to every 3 month visits, will return in 3 months.      Relevant Medications   escitalopram (LEXAPRO) 10 MG tablet   Long-term current use of benzodiazepine    Since 2008, Xanax.  Discussed BEERS criteria and risks today, she wishes to continue current regimen and agrees with every 3 month visits.  UDS and controlled substance contract up to date.        Follow up plan: Return in about 3 months (around 02/01/2021) for MOOD  and REFILLS.

## 2020-11-01 NOTE — Assessment & Plan Note (Signed)
Chronic, stable.  Only taking Xanax 0.25 MG at night for anxiety and rest.  Have sent in refill and she obtained 10/22/20, 0.25 MG QHS #90 tablets with 0 refills.  Had discussion about BEERS criteria and risks with these.   Will continue to monitor, she is up to date on contract and UDS.  Continue Lexapro.  She agrees to every 3 month visits, will return in 3 months.

## 2020-11-01 NOTE — Assessment & Plan Note (Signed)
Noted on exam, to right hand.  Recommend monitor skin closely for breakdown and alert provider if present.  Gentle skin care at home.  Consider CBC next visit.

## 2020-11-01 NOTE — Assessment & Plan Note (Signed)
Since 2008, Xanax.  Discussed BEERS criteria and risks today, she wishes to continue current regimen and agrees with every 3 month visits.  UDS and controlled substance contract up to date. 

## 2020-11-01 NOTE — Patient Instructions (Signed)

## 2021-01-24 ENCOUNTER — Other Ambulatory Visit: Payer: Self-pay | Admitting: Nurse Practitioner

## 2021-01-24 NOTE — Telephone Encounter (Signed)
Requested medications are due for refill today yes  Requested medications are on the active medication list yes  Last refill 10/24/20  Last visit 07/31/20  Future visit scheduled 02/03/21  Notes to clinic This medication can not be delegated, please assess. UDS over 360 days. Requested Prescriptions  Pending Prescriptions Disp Refills   ALPRAZolam (XANAX) 0.25 MG tablet [Pharmacy Med Name: ALPRAZOLAM 0.25 MG TABLET] 90 tablet 0    Sig: Take 1 tablet (0.25 mg total) by mouth at bedtime as needed for anxiety.     Not Delegated - Psychiatry:  Anxiolytics/Hypnotics Failed - 01/24/2021  1:52 PM      Failed - This refill cannot be delegated      Passed - Urine Drug Screen completed in last 360 days      Passed - Valid encounter within last 6 months    Recent Outpatient Visits           2 months ago Senile purpura (HCC)   [facility] Cannady, [place] T, NP   5 months ago Generalized anxiety disorder   [facility] [place], Corrie Dandy T, NP   8 months ago Mixed hyperlipidemia   [facility] [place], [place] T, NP   12 months ago Generalized anxiety disorder   [facility] [place], Corrie Dandy T, NP   1 year ago Depression, recurrent (HCC)   [facility] Cannady, Dorie Rank, NP       Future Appointments             In 1 week Cannady, Dorie Rank, NP [facility]   In 4 months  [facility]

## 2021-02-03 ENCOUNTER — Ambulatory Visit (INDEPENDENT_AMBULATORY_CARE_PROVIDER_SITE_OTHER): Payer: PPO | Admitting: Nurse Practitioner

## 2021-02-03 ENCOUNTER — Encounter: Payer: Self-pay | Admitting: Nurse Practitioner

## 2021-02-03 ENCOUNTER — Other Ambulatory Visit: Payer: Self-pay

## 2021-02-03 DIAGNOSIS — F411 Generalized anxiety disorder: Secondary | ICD-10-CM

## 2021-02-03 DIAGNOSIS — Z79899 Other long term (current) drug therapy: Secondary | ICD-10-CM

## 2021-02-03 MED ORDER — ALPRAZOLAM 0.25 MG PO TABS: 0.2500 mg | ORAL_TABLET | Freq: Every evening | ORAL | 0 refills | Status: DC | PRN

## 2021-02-03 NOTE — Patient Instructions (Signed)
Managing Anxiety, Adult ?After being diagnosed with anxiety, you may be relieved to know why you have felt or behaved a certain way. You may also feel overwhelmed about the treatment ahead and what it will mean for your life. With care and support, you can manage this condition. ?How to manage lifestyle changes ?Managing stress and anxiety ?Stress is your body's reaction to life changes and events, both good and bad. Most stress will last just a few hours, but stress can be ongoing and can lead to more than just stress. Although stress can play a major role in anxiety, it is not the same as anxiety. Stress is usually caused by something external, such as a deadline, test, or competition. Stress normally passes after the triggering event has ended.  ?Anxiety is caused by something internal, such as imagining a terrible outcome or worrying that something will go wrong that will devastate you. Anxiety often does not go away even after the triggering event is over, and it can become long-term (chronic) worry. It is important to understand the differences between stress and anxiety and to manage your stress effectively so that it does not lead to an anxious response. ?Talk with your health care provider or a counselor to learn more about reducing anxiety and stress. He or she may suggest tension reduction techniques, such as: ?Music therapy. Spend time creating or listening to music that you enjoy and that inspires you. ?Mindfulness-based meditation. Practice being aware of your normal breaths while not trying to control your breathing. It can be done while sitting or walking. ?Centering prayer. This involves focusing on a word, phrase, or sacred image that means something to you and brings you peace. ?Deep breathing. To do this, expand your stomach and inhale slowly through your nose. Hold your breath for 3-5 seconds. Then exhale slowly, letting your stomach muscles relax. ?Self-talk. Learn to notice and identify  thought patterns that lead to anxiety reactions and change those patterns to thoughts that feel peaceful. ?Muscle relaxation. Taking time to tense muscles and then relax them. ?Choose a tension reduction technique that fits your lifestyle and personality. These techniques take time and practice. Set aside 5-15 minutes a day to do them. Therapists can offer counseling and training in these techniques. The training to help with anxiety may be covered by some insurance plans. ?Other things you can do to manage stress and anxiety include: ?Keeping a stress diary. This can help you learn what triggers your reaction and then learn ways to manage your response. ?Thinking about how you react to certain situations. You may not be able to control everything, but you can control your response. ?Making time for activities that help you relax and not feeling guilty about spending your time in this way. ?Doing visual imagery. This involves imagining or creating mental pictures to help you relax. ?Practicing yoga. Through yoga poses, you can lower tension and promote relaxation. ? ?Medicines ?Medicines can help ease symptoms. Medicines for anxiety include: ?Antidepressant medicines. These are usually prescribed for long-term daily control. ?Anti-anxiety medicines. These may be added in severe cases, especially when panic attacks occur. ?Medicines will be prescribed by a health care provider. When used together, medicines, psychotherapy, and tension reduction techniques may be the most effective treatment. ?Relationships ?Relationships can play a big part in helping you recover. Try to spend more time connecting with trusted friends and family members. ?Consider going to couples counseling if you have a partner, taking family education classes, or going to family   therapy. ?Therapy can help you and others better understand your condition. ?How to recognize changes in your anxiety ?Everyone responds differently to treatment for  anxiety. Recovery from anxiety happens when symptoms decrease and stop interfering with your daily activities at home or work. This may mean that you will start to: ?Have better concentration and focus. Worry will interfere less in your daily thinking. ?Sleep better. ?Be less irritable. ?Have more energy. ?Have improved memory. ?It is also important to recognize when your condition is getting worse. Contact your health care provider if your symptoms interfere with home or work and you feel like your condition is not improving. ?Follow these instructions at home: ?Activity ?Exercise. Adults should do the following: ?Exercise for at least 150 minutes each week. The exercise should increase your heart rate and make you sweat (moderate-intensity exercise). ?Strengthening exercises at least twice a week. ?Get the right amount and quality of sleep. Most adults need 7-9 hours of sleep each night. ?Lifestyle ? ?Eat a healthy diet that includes plenty of vegetables, fruits, whole grains, low-fat dairy products, and lean protein. ?Do not eat a lot of foods that are high in fats, added sugars, or salt (sodium). ?Make choices that simplify your life. ?Do not use any products that contain nicotine or tobacco. These products include cigarettes, chewing tobacco, and vaping devices, such as e-cigarettes. If you need help quitting, ask your health care provider. ?Avoid caffeine, alcohol, and certain over-the-counter cold medicines. These may make you feel worse. Ask your pharmacist which medicines to avoid. ?General instructions ?Take over-the-counter and prescription medicines only as told by your health care provider. ?Keep all follow-up visits. This is important. ?Where to find support ?You can get help and support from these sources: ?Self-help groups. ?Online and community organizations. ?A trusted spiritual leader. ?Couples counseling. ?Family education classes. ?Family therapy. ?Where to find more information ?You may find  that joining a support group helps you deal with your anxiety. The following sources can help you locate counselors or support groups near you: ?Mental Health America: www.mentalhealthamerica.net ?Anxiety and Depression Association of America (ADAA): www.adaa.org ?National Alliance on Mental Illness (NAMI): www.nami.org ?Contact a health care provider if: ?You have a hard time staying focused or finishing daily tasks. ?You spend many hours a day feeling worried about everyday life. ?You become exhausted by worry. ?You start to have headaches or frequently feel tense. ?You develop chronic nausea or diarrhea. ?Get help right away if: ?You have a racing heart and shortness of breath. ?You have thoughts of hurting yourself or others. ?If you ever feel like you may hurt yourself or others, or have thoughts about taking your own life, get help right away. Go to your nearest emergency department or: ?Call your local emergency services (911 in the [place] ?Call a suicide crisis helpline, such as the National Suicide Prevention Lifeline at 1-800-273-8255 or 988 in the [place] This is open 24 hours a day in the [place] ?Text the Crisis Text Line at 741741 (in the [place] ?Summary ?Taking steps to learn and use tension reduction techniques can help calm you and help prevent triggering an anxiety reaction. ?When used together, medicines, psychotherapy, and tension reduction techniques may be the most effective treatment. ?Family, friends, and partners can play a big part in supporting you. ?This information is not intended to replace advice given to you by your health care provider. Make sure you discuss any questions you have with your health care provider. ?Document Revised: 09/18/2020 Document Reviewed: 06/16/2020 ?Elsevier Patient   Education ? 2022 [employer]. ? ?

## 2021-02-03 NOTE — Assessment & Plan Note (Signed)
Since 2008, Xanax.  Discussed BEERS criteria and risks today, she wishes to continue current regimen and agrees with every 3 month visits.  UDS and controlled substance contract up to date -- repeat UDS next visit.

## 2021-02-03 NOTE — Assessment & Plan Note (Signed)
Chronic, stable.  Only taking Xanax 0.25 MG at night for anxiety and rest.  Refills sent, 0.25 MG QHS #90 tablets with 0 refills.  Had discussion about BEERS criteria and risks with these.   Will continue to monitor, she is up to date on contract and UDS.  Continue Lexapro.  She agrees to every 3 month visits, will return in 3 months.

## 2021-02-03 NOTE — Progress Notes (Signed)
.cfpb   BP 133/74   Pulse 70   Temp 98.9 F (37.2 C) (Oral)   Wt 109 lb 9.6 oz (49.7 kg)   SpO2 96%   BMI 20.05 kg/m    Subjective:    Patient ID: Brandy Parker, female    DOB: 1950/11/03, 70 y.o.   MRN: [id number]  HPI: Brandy Parker is a 70 y.o. female  Chief Complaint  Patient presents with   Mood   Medication Refill    Patient states she is here for medication refill. Patient denies having any concerns at today's visit.    ANXIETY/STRESS Taking Xanax 0.25 MG PRN and Lexapro 10 MG daily.   Been on Xanax for a long while per her report, several years (2008).  Pt is aware of risks of benzo medication use to include increased sedation, respiratory suppression, falls, dependence and cardiovascular events. Pt would like to continue treatment as benefit determined to outweigh risk.  On PDMP review her last fill Xanax was 01/27/21 for 90 day supply -- she reports she did not fill , as pharmacy told her provider had cancelled. Previously got 90 day supply and would like to continue this.   Duration:stable Anxious mood: no  Excessive worrying: no Irritability: no  Sweating: no Nausea: no Palpitations:no Hyperventilation: no Panic attacks: no Agoraphobia: no  Obscessions/compulsions: no Depressed mood: no Depression screen [facility] 2/9 02/03/2021 11/01/2020 07/31/2020 05/27/2020 05/03/2020  Decreased Interest 0 0 0 0 0  Down, Depressed, Hopeless 0 0 0 0 0  PHQ - 2 Score 0 0 0 0 0  Altered sleeping 0 3 0 - 0  Tired, decreased energy 0 0 0 - 0  Change in appetite 0 0 0 - 0  Feeling bad or failure about yourself  0 0 0 - 0  Trouble concentrating 0 0 0 - 0  Moving slowly or fidgety/restless 0 0 0 - 0  Suicidal thoughts 0 0 0 - 0  PHQ-9 Score 0 3 0 - 0  Difficult doing work/chores Not difficult at all Not difficult at all Not difficult at all - -  Some recent data might be hidden   GAD 7 : Generalized Anxiety Score 02/03/2021 11/01/2020 07/31/2020 05/03/2020  Nervous, Anxious, on Edge  0 0 0 0  Control/stop worrying 0 0 0 0  Worry too much - different things 0 0 0 0  Trouble relaxing 0 0 0 0  Restless 0 3 0 0  Easily annoyed or irritable 0 0 0 0  Afraid - awful might happen 0 0 0 0  Total GAD 7 Score 0 3 0 0  Anxiety Difficulty Not difficult at all Not difficult at all - Not difficult at all   Relevant past medical, surgical, family and social history reviewed and updated as indicated. Interim medical history since our last visit reviewed. Allergies and medications reviewed and updated.  Review of Systems  Constitutional:  Negative for activity change, appetite change, diaphoresis, fatigue and fever.  Respiratory:  Negative for cough, chest tightness and shortness of breath.   Cardiovascular:  Negative for chest pain, palpitations and leg swelling.  Gastrointestinal: Negative.   Neurological: Negative.   Psychiatric/Behavioral:  Negative for decreased concentration, self-injury, sleep disturbance and suicidal ideas. The patient is not nervous/anxious.    Per HPI unless specifically indicated above     Objective:    BP 133/74   Pulse 70   Temp 98.9 F (37.2 C) (Oral)   Wt 109 lb 9.6 oz (  49.7 kg)   SpO2 96%   BMI 20.05 kg/m   Wt Readings from Last 3 Encounters:  02/03/21 109 lb 9.6 oz (49.7 kg)  11/01/20 110 lb 12.8 oz (50.3 kg)  07/31/20 107 lb 9.6 oz (48.8 kg)    Physical Exam Vitals and nursing note reviewed.  Constitutional:      General: She is awake. She is not in acute distress.    Appearance: She is well-developed and well-groomed. She is not ill-appearing.  HENT:     Head: Normocephalic.     Right Ear: Hearing normal.     Left Ear: Hearing normal.  Eyes:     General: Lids are normal.        Right eye: No discharge.        Left eye: No discharge.     Conjunctiva/sclera: Conjunctivae normal.     Pupils: Pupils are equal, round, and reactive to light.  Neck:     Vascular: No carotid bruit.  Cardiovascular:     Rate and Rhythm: Normal  rate and regular rhythm.     Heart sounds: Normal heart sounds. No murmur heard.   No gallop.  Pulmonary:     Effort: Pulmonary effort is normal. No accessory muscle usage or respiratory distress.     Breath sounds: Normal breath sounds.  Abdominal:     General: Bowel sounds are normal.     Palpations: Abdomen is soft.  Musculoskeletal:     Cervical back: Normal range of motion and neck supple.     Right lower leg: No edema.     Left lower leg: No edema.  Skin:    General: Skin is warm and dry.  Neurological:     Mental Status: She is alert and oriented to person, place, and time.  Psychiatric:        Attention and Perception: Attention normal.        Mood and Affect: Mood normal.        Speech: Speech normal.        Behavior: Behavior normal. Behavior is cooperative.        Thought Content: Thought content normal.   Results for orders placed or performed in visit on 05/03/20  [id number] 11+Oxyco+Alc+Crt-Bund  Result Value Ref Range   Ethanol Negative Cutoff=0.020 %   Amphetamines, Urine Negative Cutoff=1000 ng/mL   Barbiturate Negative Cutoff=200 ng/mL   BENZODIAZ UR QL See Final Results Cutoff=200 ng/mL   Cannabinoid Quant, Ur Negative Cutoff=50 ng/mL   Cocaine (Metabolite) Negative Cutoff=300 ng/mL   OPIATE SCREEN URINE Negative Cutoff=300 ng/mL   Oxycodone/Oxymorphone, Urine Negative Cutoff=300 ng/mL   Phencyclidine Negative Cutoff=25 ng/mL   Methadone Screen, Urine Negative Cutoff=300 ng/mL   Propoxyphene Negative Cutoff=300 ng/mL   Meperidine Negative Cutoff=200 ng/mL   Tramadol Negative Cutoff=200 ng/mL   Creatinine 108.0 20.0 - 300.0 mg/dL   pH, Urine 6.7 4.5 - 8.9  Comprehensive metabolic panel  Result Value Ref Range   Glucose 58 (L) 65 - 99 mg/dL   BUN 12 8 - 27 mg/dL   Creatinine, Ser 0.83 0.57 - 1.00 mg/dL   GFR calc non Af Amer 71 >59 mL/min/1.73   GFR calc Af Amer 82 >59 mL/min/1.73   BUN/Creatinine Ratio 14 12 - 28   Sodium 142 134 - 144 mmol/L    Potassium 3.9 3.5 - 5.2 mmol/L   Chloride 98 96 - 106 mmol/L   CO2 23 20 - 29 mmol/L   Calcium 10.0 8.7 - 10.3 mg/dL  Total Protein 7.1 6.0 - 8.5 g/dL   Albumin 5.0 (H) 3.7 - 4.7 g/dL   Globulin, Total 2.1 1.5 - 4.5 g/dL   Albumin/Globulin Ratio 2.4 (H) 1.2 - 2.2   Bilirubin Total 0.2 0.0 - 1.2 mg/dL   Alkaline Phosphatase 67 44 - 121 IU/L   AST 27 0 - 40 IU/L   ALT 17 0 - 32 IU/L  CBC with Differential/Platelet  Result Value Ref Range   WBC 6.1 3.4 - 10.8 x10E3/uL   RBC 4.24 3.77 - 5.28 x10E6/uL   Hemoglobin 13.5 11.1 - 15.9 g/dL   Hematocrit 39.8 34.0 - 46.6 %   MCV 94 79 - 97 fL   MCH 31.8 26.6 - 33.0 pg   MCHC 33.9 31.5 - 35.7 g/dL   RDW 11.9 11.7 - 15.4 %   Platelets 330 150 - 450 x10E3/uL   Neutrophils 47 Not Estab. %   Lymphs 43 Not Estab. %   Monocytes 7 Not Estab. %   Eos 2 Not Estab. %   Basos 1 Not Estab. %   Neutrophils Absolute 2.9 1.4 - 7.0 x10E3/uL   Lymphocytes Absolute 2.6 0.7 - 3.1 x10E3/uL   Monocytes Absolute 0.4 0.1 - 0.9 x10E3/uL   EOS (ABSOLUTE) 0.1 0.0 - 0.4 x10E3/uL   Basophils Absolute 0.1 0.0 - 0.2 x10E3/uL   Immature Granulocytes 0 Not Estab. %   Immature Grans (Abs) 0.0 0.0 - 0.1 x10E3/uL  Lipid Panel w/o Chol/HDL Ratio  Result Value Ref Range   Cholesterol, Total 193 100 - 199 mg/dL   Triglycerides 104 0 - 149 mg/dL   HDL 91 >39 mg/dL   VLDL Cholesterol Cal 18 5 - 40 mg/dL   LDL Chol Calc (NIH) 84 0 - 99 mg/dL  TSH  Result Value Ref Range   TSH 1.260 0.450 - 4.500 uIU/mL  VITAMIN D 25 Hydroxy (Vit-D Deficiency, Fractures)  Result Value Ref Range   Vit D, 25-Hydroxy 48.6 30.0 - 100.0 ng/mL  Hepatitis C antibody  Result Value Ref Range   Hep C Virus Ab <0.1 0.0 - 0.9 s/co ratio  Benzodiazepines Confirm, Urine  Result Value Ref Range   Benzodiazepines Negative Cutoff=100 ng/mL      Assessment & Plan:   Problem List Items Addressed This Visit       Other   Generalized anxiety disorder    Chronic, stable.  Only taking Xanax 0.25  MG at night for anxiety and rest.  Refills sent, 0.25 MG QHS #90 tablets with 0 refills.  Had discussion about BEERS criteria and risks with these.   Will continue to monitor, she is up to date on contract and UDS.  Continue Lexapro.  She agrees to every 3 month visits, will return in 3 months.      Relevant Medications   ALPRAZolam (XANAX) 0.25 MG tablet   Long-term current use of benzodiazepine    Since 2008, Xanax.  Discussed BEERS criteria and risks today, she wishes to continue current regimen and agrees with every 3 month visits.  UDS and controlled substance contract up to date -- repeat UDS next visit.        Follow up plan: Return in about 3 months (around 05/06/2021) for Annual physical -- with repeat UDS.

## 2021-05-04 NOTE — Patient Instructions (Signed)
Managing Anxiety, Adult ?After being diagnosed with anxiety, you may be relieved to know why you have felt or behaved a certain way. You may also feel overwhelmed about the treatment ahead and what it will mean for your life. With care and support, you can manage this condition. ?How to manage lifestyle changes ?Managing stress and anxiety ?Stress is your body's reaction to life changes and events, both good and bad. Most stress will last just a few hours, but stress can be ongoing and can lead to more than just stress. Although stress can play a major role in anxiety, it is not the same as anxiety. Stress is usually caused by something external, such as a deadline, test, or competition. Stress normally passes after the triggering event has ended.  ?Anxiety is caused by something internal, such as imagining a terrible outcome or worrying that something will go wrong that will devastate you. Anxiety often does not go away even after the triggering event is over, and it can become long-term (chronic) worry. It is important to understand the differences between stress and anxiety and to manage your stress effectively so that it does not lead to an anxious response. ?Talk with your health care provider or a counselor to learn more about reducing anxiety and stress. He or she may suggest tension reduction techniques, such as: ?Music therapy. Spend time creating or listening to music that you enjoy and that inspires you. ?Mindfulness-based meditation. Practice being aware of your normal breaths while not trying to control your breathing. It can be done while sitting or walking. ?Centering prayer. This involves focusing on a word, phrase, or sacred image that means something to you and brings you peace. ?Deep breathing. To do this, expand your stomach and inhale slowly through your nose. Hold your breath for 3-5 seconds. Then exhale slowly, letting your stomach muscles relax. ?Self-talk. Learn to notice and identify  thought patterns that lead to anxiety reactions and change those patterns to thoughts that feel peaceful. ?Muscle relaxation. Taking time to tense muscles and then relax them. ?Choose a tension reduction technique that fits your lifestyle and personality. These techniques take time and practice. Set aside 5-15 minutes a day to do them. Therapists can offer counseling and training in these techniques. The training to help with anxiety may be covered by some insurance plans. ?Other things you can do to manage stress and anxiety include: ?Keeping a stress diary. This can help you learn what triggers your reaction and then learn ways to manage your response. ?Thinking about how you react to certain situations. You may not be able to control everything, but you can control your response. ?Making time for activities that help you relax and not feeling guilty about spending your time in this way. ?Doing visual imagery. This involves imagining or creating mental pictures to help you relax. ?Practicing yoga. Through yoga poses, you can lower tension and promote relaxation. ? ?Medicines ?Medicines can help ease symptoms. Medicines for anxiety include: ?Antidepressant medicines. These are usually prescribed for long-term daily control. ?Anti-anxiety medicines. These may be added in severe cases, especially when panic attacks occur. ?Medicines will be prescribed by a health care provider. When used together, medicines, psychotherapy, and tension reduction techniques may be the most effective treatment. ?Relationships ?Relationships can play a big part in helping you recover. Try to spend more time connecting with trusted friends and family members. ?Consider going to couples counseling if you have a partner, taking family education classes, or going to family   therapy. ?Therapy can help you and others better understand your condition. ?How to recognize changes in your anxiety ?Everyone responds differently to treatment for  anxiety. Recovery from anxiety happens when symptoms decrease and stop interfering with your daily activities at home or work. This may mean that you will start to: ?Have better concentration and focus. Worry will interfere less in your daily thinking. ?Sleep better. ?Be less irritable. ?Have more energy. ?Have improved memory. ?It is also important to recognize when your condition is getting worse. Contact your health care provider if your symptoms interfere with home or work and you feel like your condition is not improving. ?Follow these instructions at home: ?Activity ?Exercise. Adults should do the following: ?Exercise for at least 150 minutes each week. The exercise should increase your heart rate and make you sweat (moderate-intensity exercise). ?Strengthening exercises at least twice a week. ?Get the right amount and quality of sleep. Most adults need 7-9 hours of sleep each night. ?Lifestyle ? ?Eat a healthy diet that includes plenty of vegetables, fruits, whole grains, low-fat dairy products, and lean protein. ?Do not eat a lot of foods that are high in fats, added sugars, or salt (sodium). ?Make choices that simplify your life. ?Do not use any products that contain nicotine or tobacco. These products include cigarettes, chewing tobacco, and vaping devices, such as e-cigarettes. If you need help quitting, ask your health care provider. ?Avoid caffeine, alcohol, and certain over-the-counter cold medicines. These may make you feel worse. Ask your pharmacist which medicines to avoid. ?General instructions ?Take over-the-counter and prescription medicines only as told by your health care provider. ?Keep all follow-up visits. This is important. ?Where to find support ?You can get help and support from these sources: ?Self-help groups. ?Online and community organizations. ?A trusted spiritual leader. ?Couples counseling. ?Family education classes. ?Family therapy. ?Where to find more information ?You may find  that joining a support group helps you deal with your anxiety. The following sources can help you locate counselors or support groups near you: ?Mental Health America: www.mentalhealthamerica.net ?Anxiety and Depression Association of America (ADAA): www.adaa.org ?National Alliance on Mental Illness (NAMI): www.nami.org ?Contact a health care provider if: ?You have a hard time staying focused or finishing daily tasks. ?You spend many hours a day feeling worried about everyday life. ?You become exhausted by worry. ?You start to have headaches or frequently feel tense. ?You develop chronic nausea or diarrhea. ?Get help right away if: ?You have a racing heart and shortness of breath. ?You have thoughts of hurting yourself or others. ?If you ever feel like you may hurt yourself or others, or have thoughts about taking your own life, get help right away. Go to your nearest emergency department or: ?Call your local emergency services (911 in the [place] ?Call a suicide crisis helpline, such as the National Suicide Prevention Lifeline at 1-800-273-8255 or 988 in the [place] This is open 24 hours a day in the [place] ?Text the Crisis Text Line at 741741 (in the [place] ?Summary ?Taking steps to learn and use tension reduction techniques can help calm you and help prevent triggering an anxiety reaction. ?When used together, medicines, psychotherapy, and tension reduction techniques may be the most effective treatment. ?Family, friends, and partners can play a big part in supporting you. ?This information is not intended to replace advice given to you by your health care provider. Make sure you discuss any questions you have with your health care provider. ?Document Revised: 09/18/2020 Document Reviewed: 06/16/2020 ?Elsevier Patient   Education ? 2022 [employer]. ? ?

## 2021-05-07 ENCOUNTER — Ambulatory Visit (INDEPENDENT_AMBULATORY_CARE_PROVIDER_SITE_OTHER): Payer: PPO | Admitting: Nurse Practitioner

## 2021-05-07 ENCOUNTER — Other Ambulatory Visit: Payer: Self-pay

## 2021-05-07 ENCOUNTER — Encounter: Payer: Self-pay | Admitting: Nurse Practitioner

## 2021-05-07 VITALS — BP 136/72 | HR 68 | Temp 98.4°F | Ht 61.5 in | Wt 111.6 lb

## 2021-05-07 DIAGNOSIS — F17210 Nicotine dependence, cigarettes, uncomplicated: Secondary | ICD-10-CM | POA: Diagnosis not present

## 2021-05-07 DIAGNOSIS — F411 Generalized anxiety disorder: Secondary | ICD-10-CM | POA: Diagnosis not present

## 2021-05-07 DIAGNOSIS — Z79899 Other long term (current) drug therapy: Secondary | ICD-10-CM | POA: Diagnosis not present

## 2021-05-07 DIAGNOSIS — D692 Other nonthrombocytopenic purpura: Secondary | ICD-10-CM

## 2021-05-07 DIAGNOSIS — E782 Mixed hyperlipidemia: Secondary | ICD-10-CM

## 2021-05-07 DIAGNOSIS — Z1231 Encounter for screening mammogram for malignant neoplasm of breast: Secondary | ICD-10-CM

## 2021-05-07 DIAGNOSIS — Z23 Encounter for immunization: Secondary | ICD-10-CM

## 2021-05-07 DIAGNOSIS — M8588 Other specified disorders of bone density and structure, other site: Secondary | ICD-10-CM | POA: Diagnosis not present

## 2021-05-07 DIAGNOSIS — Z0000 Encounter for general adult medical examination without abnormal findings: Secondary | ICD-10-CM

## 2021-05-07 NOTE — Assessment & Plan Note (Signed)
Since 2008, Xanax.  BEERS criteria and risks dicussed with patient, she wishes to continue current regimen and agrees with every 3 month visits.  UDS and controlled substance contract up to date -- repeat UDS next visit. ?

## 2021-05-07 NOTE — Assessment & Plan Note (Addendum)
Tobacco cessation recommended to patient. Various available options also discussed with patient for assistance with tobacco cessation including over the counter methods (Nicotine gum, patch and lozenges). We also discussed prescription options (Chantix, Nicotine Inhaler / Nasal Spray). The patient is not interested in pursuing any prescription tobacco cessation options at this time. ? ?

## 2021-05-07 NOTE — Assessment & Plan Note (Addendum)
Chronic, stable.  Only taking Xanax 0.25 MG at night for anxiety and rest. Patient receives education about BEERS criteria and risks with these. Patient states anxiety is under control and depression screen today is 0 (Zero). Will continue to monitor, she is up to date on contract and UDS.  Continue Lexapro.  She agrees to every 3 month visits, will return in 3 months. ?

## 2021-05-07 NOTE — Assessment & Plan Note (Signed)
Signed on 03/31/2019 

## 2021-05-07 NOTE — Assessment & Plan Note (Signed)
Chronic, ongoing.  Patient takes simvastatin 20 mg daily and is tolerating this medication. Continue current medication regimen and adjust as needed.  Plan on lipid panel today.  Refills as needed. ?

## 2021-05-07 NOTE — Progress Notes (Signed)
NOTE WRITTEN BY [employer] DNP STUDENT.  ASSESSMENT AND PLAN OF CARE REVIEWED WITH STUDENT, AGREE WITH ABOVE FINDINGS AND PLAN.   BP 136/72 (BP Location: Left Arm, Patient Position: Sitting, Cuff Size: Normal)    Pulse 68    Temp 98.4 F (36.9 C) (Oral)    Ht 5' 1.5" (1.562 m)    Wt 111 lb 9.6 oz (50.6 kg)    SpO2 97%    BMI 20.75 kg/m    Subjective:    Patient ID: Brandy Parker, female    DOB: 12/29/1950, 71 y.o.   MRN: [id number]  HPI: Brandy Parker is a 71 y.o. female presenting on 05/07/2021 for comprehensive medical examination. Current medical complaints include:none  She currently lives with: alone Menopausal Symptoms: no  OSTEOPENIA Vitamin D3 supplement at home.  Due for next DEXA November 2023. Adequate calcium & vitamin D: no Weight bearing exercises: yes   ANXIETY/STRESS Taking Xanax 0.25 MG PRN and Lexapro 10 MG daily.    Taken Xanax for a long while per her report, several years (2008). Pt is aware of risks of benzo medication use to include increased sedation, respiratory suppression, falls, dependence and cardiovascular events. Pt would like to continue treatment as benefit determined to outweigh risk.  On PDMP review her last fill Xanax was 03/07/21 for 90 day supply. Duration:stable Anxious mood: no  Excessive worrying: no Irritability: no  Sweating: no Nausea: no Palpitations:no Hyperventilation: no Panic attacks: no Agoraphobia: no  Obscessions/compulsions: no Depressed mood: no Depression screen [facility] 2/9 05/07/2021 02/03/2021 11/01/2020 07/31/2020 05/27/2020  Decreased Interest 0 0 0 0 0  Down, Depressed, Hopeless 0 0 0 0 0  PHQ - 2 Score 0 0 0 0 0  Altered sleeping 0 0 3 0 -  Tired, decreased energy 0 0 0 0 -  Change in appetite 0 0 0 0 -  Feeling bad or failure about yourself  0 0 0 0 -  Trouble concentrating 0 0 0 0 -  Moving slowly or fidgety/restless 0 0 0 0 -  Suicidal thoughts 0 0 0 0 -  PHQ-9 Score 0 0 3 0 -  Difficult doing work/chores - Not  difficult at all Not difficult at all Not difficult at all -  Some recent data might be hidden   Anhedonia: no Weight changes: no Insomnia: no  Hypersomnia: no Fatigue/loss of energy: no Feelings of worthlessness: no Feelings of guilt: no Impaired concentration/indecisiveness: no Suicidal ideations: no  Crying spells: no Recent Stressors/Life Changes: no   Relationship problems: no   Family stress: no     Financial stress: no    Job stress: no    Recent death/loss: no   HYPERLIPIDEMIA Currently taking Simvastatin 20 MG daily.  She is a current smoker, smokes about 5-6 cigarettes a day.  Started smoking in 20's.  Not interested in quitting.   Hyperlipidemia status: excellent compliance Satisfied with current treatment?  yes Side effects:  no Medication compliance: excellent compliance Past cholesterol meds: none Supplements: none Aspirin:  no The 10-year ASCVD risk score (Arnett DK, et al., 2019) is: 33.1%   Values used to calculate the score:     Age: 53 years     Sex: Female     Is Non-Hispanic African American: No     Diabetic: Yes     Tobacco smoker: Yes     Systolic Blood Pressure: 136 mmHg     Is BP treated: No     HDL Cholesterol:  91 mg/dL     Total Cholesterol: 193 mg/dL Chest pain:  no Coronary artery disease:  no Family history CAD:  no Family history early CAD:  no    The patient does not have a history of falls. I did not complete a risk assessment for falls. A plan of care for falls was not documented.   Past Medical History:  Past Medical History:  Diagnosis Date   Anxiety    Hyperlipemia     Surgical History:  Past Surgical History:  Procedure Laterality Date   ORIF ANKLE FRACTURE Left 07/20/2016   Procedure: OPEN REDUCTION INTERNAL FIXATION (ORIF) ANKLE FRACTURE;  Surgeon: Lyndle Herrlich, MD;  Location: [facility] ORS;  Service: Orthopedics;  Laterality: Left;    Medications:  Current Outpatient Medications on File Prior to Visit  Medication  Sig   ALPRAZolam (XANAX) 0.25 MG tablet Take 1 tablet (0.25 mg total) by mouth at bedtime as needed for anxiety.   cholecalciferol (VITAMIN D3) 25 MCG (1000 UNIT) tablet Take 1,000 Units by mouth daily.   escitalopram (LEXAPRO) 10 MG tablet Take 1 tablet (10 mg total) by mouth daily.   Multiple Vitamin (MULTIVITAMIN) tablet Take 1 tablet by mouth daily.   simvastatin (ZOCOR) 20 MG tablet Take 1 tablet (20 mg total) by mouth daily at 6 PM.   No current facility-administered medications on file prior to visit.    Allergies:  Allergies  Allergen Reactions   Sulfa Antibiotics Other (See Comments)    Per patient, unknown reaction    Social History:  Social History   Socioeconomic History   Marital status: Single    Spouse name: Not on file   Number of children: Not on file   Years of education: Not on file   Highest education level: Not on file  Occupational History   Occupation: fulltime     Employer: [employer]  Tobacco Use   Smoking status: Every Day    Packs/day: 0.25    Types: Cigarettes   Smokeless tobacco: Never  Vaping Use   Vaping Use: Never used  Substance and Sexual Activity   Alcohol use: No   Drug use: No   Sexual activity: Not on file  Other Topics Concern   Not on file  Social History Narrative   Not on file   Social Determinants of Health   Financial Resource Strain: Low Risk    Difficulty of Paying Living Expenses: Not hard at all  Food Insecurity: No Food Insecurity   Worried About Programme researcher, broadcasting/film/video in the Last Year: Never true   Ran Out of Food in the Last Year: Never true  Transportation Needs: No Transportation Needs   Lack of Transportation (Medical): No   Lack of Transportation (Non-Medical): No  Physical Activity: Inactive   Days of Exercise per Week: 0 days   Minutes of Exercise per Session: 0 min  Stress: No Stress Concern Present   Feeling of Stress : Not at all  Social Connections: Not on file  Intimate Partner Violence: Not on file    Social History   Tobacco Use  Smoking Status Every Day   Packs/day: 0.25   Types: Cigarettes  Smokeless Tobacco Never   Social History   Substance and Sexual Activity  Alcohol Use No    Family History:  Family History  Problem Relation Age of Onset   Hypertension Mother    Heart failure Mother    Hypertension Father    Hyperlipidemia Father  Cancer Father        bladder   Heart failure Father    Cancer Sister        bladder   Breast cancer Neg Hx     Past medical history, surgical history, medications, allergies, family history and social history reviewed with patient today and changes made to appropriate areas of the chart.   Review of Systems  Constitutional:  Negative for chills, fever, malaise/fatigue and weight loss.  HENT:  Negative for hearing loss and tinnitus.   Eyes:  Negative for blurred vision and double vision.  Respiratory:  Negative for cough, hemoptysis, sputum production, shortness of breath and wheezing.   Cardiovascular:  Negative for chest pain, palpitations and leg swelling.  Gastrointestinal:  Negative for constipation, diarrhea, nausea and vomiting.  Genitourinary:  Negative for dysuria.  Musculoskeletal:  Negative for falls and myalgias.  Skin:  Negative for rash.  Neurological:  Negative for dizziness, tingling and headaches.  Psychiatric/Behavioral:  Negative for depression and suicidal ideas. The patient is not nervous/anxious and does not have insomnia.    All other ROS negative except what is listed above and in the HPI.      Objective:    BP 136/72 (BP Location: Left Arm, Patient Position: Sitting, Cuff Size: Normal)    Pulse 68    Temp 98.4 F (36.9 C) (Oral)    Ht 5' 1.5" (1.562 m)    Wt 111 lb 9.6 oz (50.6 kg)    SpO2 97%    BMI 20.75 kg/m   Wt Readings from Last 3 Encounters:  05/07/21 111 lb 9.6 oz (50.6 kg)  02/03/21 109 lb 9.6 oz (49.7 kg)  11/01/20 110 lb 12.8 oz (50.3 kg)    Physical Exam Constitutional:       General: She is not in acute distress.    Appearance: Normal appearance. She is well-developed and well-groomed.  HENT:     Head: Normocephalic and atraumatic.     Jaw: No tenderness.     Salivary Glands: Right salivary gland is not diffusely enlarged or tender. Left salivary gland is not diffusely enlarged or tender.     Right Ear: Tympanic membrane, ear canal and external ear normal.     Left Ear: Tympanic membrane, ear canal and external ear normal.     Nose: Nose normal.     Mouth/Throat:     Mouth: Mucous membranes are moist.     Pharynx: Oropharynx is clear.  Eyes:     General: Lids are normal. No scleral icterus.       Right eye: No discharge.        Left eye: No discharge.     Extraocular Movements: Extraocular movements intact.     Conjunctiva/sclera: Conjunctivae normal.     Pupils: Pupils are equal, round, and reactive to light.  Neck:     Thyroid: No thyroid mass, thyromegaly or thyroid tenderness.     Vascular: No carotid bruit or JVD.     Trachea: Trachea normal.  Cardiovascular:     Rate and Rhythm: Normal rate and regular rhythm.     Pulses: Normal pulses.          Dorsalis pedis pulses are 2+ on the right side and 2+ on the left side.       Posterior tibial pulses are 2+ on the right side and 2+ on the left side.     Heart sounds: Normal heart sounds. No murmur heard.   No gallop.  Pulmonary:     Effort: Pulmonary effort is normal. No accessory muscle usage or respiratory distress.     Breath sounds: Normal breath sounds. No wheezing or rhonchi.  Chest:     Chest wall: No mass, lacerations, deformity, swelling, tenderness or edema.  Breasts:    Right: Normal. No swelling, inverted nipple, mass, nipple discharge, skin change or tenderness.     Left: Normal. No swelling, inverted nipple, mass, nipple discharge, skin change or tenderness.  Abdominal:     General: Abdomen is flat. Bowel sounds are normal. There is no distension.     Palpations: Abdomen is soft.  There is no hepatomegaly or splenomegaly.     Tenderness: There is no abdominal tenderness.  Musculoskeletal:        General: No swelling or tenderness.     Cervical back: Normal range of motion. No edema, erythema or tenderness.     Right lower leg: No edema.     Left lower leg: No edema.  Lymphadenopathy:     Head:     Right side of head: No submental, submandibular, tonsillar, preauricular, posterior auricular or occipital adenopathy.     Left side of head: No submental, submandibular, tonsillar, preauricular, posterior auricular or occipital adenopathy.     Cervical: No cervical adenopathy.     Right cervical: No superficial, deep or posterior cervical adenopathy.    Left cervical: No superficial, deep or posterior cervical adenopathy.     Upper Body:     Right upper body: No supraclavicular, axillary or pectoral adenopathy.     Left upper body: No supraclavicular, axillary or pectoral adenopathy.  Skin:    General: Skin is warm and dry.     Capillary Refill: Capillary refill takes less than 2 seconds.     Coloration: Skin is not pale.     Findings: No erythema or signs of injury.  Neurological:     Mental Status: She is alert and oriented to person, place, and time.  Psychiatric:        Attention and Perception: Attention normal.        Mood and Affect: Mood and affect normal. Mood is not anxious or depressed.        Speech: Speech normal.        Behavior: Behavior normal. Behavior is cooperative.        Thought Content: Thought content normal.        Cognition and Memory: Cognition normal.        Judgment: Judgment normal.   Results for orders placed or performed in visit on 05/03/20  [id number] 11+Oxyco+Alc+Crt-Bund  Result Value Ref Range   Ethanol Negative Cutoff=0.020 %   Amphetamines, Urine Negative Cutoff=1000 ng/mL   Barbiturate Negative Cutoff=200 ng/mL   BENZODIAZ UR QL See Final Results Cutoff=200 ng/mL   Cannabinoid Quant, Ur Negative Cutoff=50 ng/mL   Cocaine  (Metabolite) Negative Cutoff=300 ng/mL   OPIATE SCREEN URINE Negative Cutoff=300 ng/mL   Oxycodone/Oxymorphone, Urine Negative Cutoff=300 ng/mL   Phencyclidine Negative Cutoff=25 ng/mL   Methadone Screen, Urine Negative Cutoff=300 ng/mL   Propoxyphene Negative Cutoff=300 ng/mL   Meperidine Negative Cutoff=200 ng/mL   Tramadol Negative Cutoff=200 ng/mL   Creatinine 108.0 20.0 - 300.0 mg/dL   pH, Urine 6.7 4.5 - 8.9  Comprehensive metabolic panel  Result Value Ref Range   Glucose 58 (L) 65 - 99 mg/dL   BUN 12 8 - 27 mg/dL   Creatinine, Ser 0.450.83 0.57 - 1.00 mg/dL   GFR calc non  Af Amer 71 >59 mL/min/1.73   GFR calc Af Amer 82 >59 mL/min/1.73   BUN/Creatinine Ratio 14 12 - 28   Sodium 142 134 - 144 mmol/L   Potassium 3.9 3.5 - 5.2 mmol/L   Chloride 98 96 - 106 mmol/L   CO2 23 20 - 29 mmol/L   Calcium 10.0 8.7 - 10.3 mg/dL   Total Protein 7.1 6.0 - 8.5 g/dL   Albumin 5.0 (H) 3.7 - 4.7 g/dL   Globulin, Total 2.1 1.5 - 4.5 g/dL   Albumin/Globulin Ratio 2.4 (H) 1.2 - 2.2   Bilirubin Total 0.2 0.0 - 1.2 mg/dL   Alkaline Phosphatase 67 44 - 121 IU/L   AST 27 0 - 40 IU/L   ALT 17 0 - 32 IU/L  CBC with Differential/Platelet  Result Value Ref Range   WBC 6.1 3.4 - 10.8 x10E3/uL   RBC 4.24 3.77 - 5.28 x10E6/uL   Hemoglobin 13.5 11.1 - 15.9 g/dL   Hematocrit 16.139.8 09.634.0 - 46.6 %   MCV 94 79 - 97 fL   MCH 31.8 26.6 - 33.0 pg   MCHC 33.9 31.5 - 35.7 g/dL   RDW 04.511.9 40.911.7 - 81.115.4 %   Platelets 330 150 - 450 x10E3/uL   Neutrophils 47 Not Estab. %   Lymphs 43 Not Estab. %   Monocytes 7 Not Estab. %   Eos 2 Not Estab. %   Basos 1 Not Estab. %   Neutrophils Absolute 2.9 1.4 - 7.0 x10E3/uL   Lymphocytes Absolute 2.6 0.7 - 3.1 x10E3/uL   Monocytes Absolute 0.4 0.1 - 0.9 x10E3/uL   EOS (ABSOLUTE) 0.1 0.0 - 0.4 x10E3/uL   Basophils Absolute 0.1 0.0 - 0.2 x10E3/uL   Immature Granulocytes 0 Not Estab. %   Immature Grans (Abs) 0.0 0.0 - 0.1 x10E3/uL  Lipid Panel w/o Chol/HDL Ratio  Result Value  Ref Range   Cholesterol, Total 193 100 - 199 mg/dL   Triglycerides 914104 0 - 149 mg/dL   HDL 91 >78>39 mg/dL   VLDL Cholesterol Cal 18 5 - 40 mg/dL   LDL Chol Calc (NIH) 84 0 - 99 mg/dL  TSH  Result Value Ref Range   TSH 1.260 0.450 - 4.500 uIU/mL  VITAMIN D 25 Hydroxy (Vit-D Deficiency, Fractures)  Result Value Ref Range   Vit D, 25-Hydroxy 48.6 30.0 - 100.0 ng/mL  Hepatitis C antibody  Result Value Ref Range   Hep C Virus Ab <0.1 0.0 - 0.9 s/co ratio  Benzodiazepines Confirm, Urine  Result Value Ref Range   Benzodiazepines Negative Cutoff=100 ng/mL      Assessment & Plan:   Problem List Items Addressed This Visit       Cardiovascular and Mediastinum   Senile purpura (HCC) - Primary    Noted on exam, to leg  Recommend monitor skin closely for breakdown and alert provider if present.  Gentle skin care at home.  CBC ordered today.      Relevant Orders   CBC with Differential/Platelet     Musculoskeletal and Integument   Osteopenia of lumbar spine    Noted on DEXA 2018, continue Vitamin D and check level today.  DEXA repeat in 2023.      Relevant Orders   VITAMIN D 25 Hydroxy (Vit-D Deficiency, Fractures)     Other   Controlled substance agreement signed    Signed on 03/31/2019      Generalized anxiety disorder    Chronic, stable.  Only taking Xanax 0.25 MG at  night for anxiety and rest. Patient receives education about BEERS criteria and risks with these. Patient states anxiety is under control and depression screen today is 0 (Zero). Will continue to monitor, she is up to date on contract and UDS.  Continue Lexapro.  She agrees to every 3 month visits, will return in 3 months.      Relevant Orders   [id number] 11+Oxyco+Alc+Crt-Bund   TSH   Hyperlipidemia    Chronic, ongoing.  Patient takes simvastatin 20 mg daily and is tolerating this medication. Continue current medication regimen and adjust as needed.  Plan on lipid panel today.  Refills as needed.      Relevant  Orders   Comprehensive metabolic panel   Lipid Panel w/o Chol/HDL Ratio   Long-term current use of benzodiazepine    Since 2008, Xanax.  BEERS criteria and risks dicussed with patient, she wishes to continue current regimen and agrees with every 3 month visits.  UDS and controlled substance contract up to date -- repeat UDS next visit.      Relevant Orders   [id number] 11+Oxyco+Alc+Crt-Bund   Nicotine dependence, cigarettes, uncomplicated    Tobacco cessation recommended to patient. Various available options also discussed with patient for assistance with tobacco cessation including over the counter methods (Nicotine gum, patch and lozenges). We also discussed prescription options (Chantix, Nicotine Inhaler / Nasal Spray). The patient is not interested in pursuing any prescription tobacco cessation options at this time.       Other Visit Diagnoses     Encounter for screening mammogram for malignant neoplasm of breast       Mammogram ordered, due in April.   Relevant Orders   MM 3D SCREEN BREAST BILATERAL   Flu vaccine need       Flu vaccine in office today.   Relevant Orders   Flu Vaccine QUAD High Dose(Fluad) (Completed)   Encounter for annual physical exam       Annual physical with labs today and health maintenance reviewed.        Follow up plan: Return in about 3 months (around 08/07/2021) for MOOD.   LABORATORY TESTING:  - Pap smear: not applicable  IMMUNIZATIONS:   - Tdap: Tetanus vaccination status reviewed: last tetanus booster within 10 years. - Influenza: Administered today check - Pneumovax: Up to date - Prevnar: Up to date - COVID: Up to date - HPV: Not applicable - Shingrix vaccine: Refused, want to hold off for now  SCREENING: -Mammogram: Up to date  - Colonoscopy:  Patient would like to check insurance for coverage and will notify PCP to place order. - Bone Density: Up to date -- due next 02/01/22 -Hearing Test: Up to date  -Spirometry: Not applicable    PATIENT COUNSELING:    Advised to avoid cigarette smoking.    Diet: Encouraged to adjust caloric intake to maintain  or achieve ideal body weight, to reduce intake of dietary saturated fat and total fat, to limit sodium intake by avoiding high sodium foods and not adding table salt, and to maintain adequate dietary potassium and calcium preferably from fresh fruits, vegetables, and low-fat dairy products.    Stressed the importance of regular exercise  Injury prevention: Discussed safety belts, safety helmets, smoke detector, smoking near bedding or upholstery.   Dental health: Discussed importance of regular tooth brushing, flossing, and dental visits.    NEXT PREVENTATIVE PHYSICAL DUE IN 1 YEAR. Return in about 3 months (around 08/07/2021) for MOOD.

## 2021-05-07 NOTE — Assessment & Plan Note (Signed)
Noted on DEXA 2018, continue Vitamin D and check level today.  DEXA repeat in 2023. 

## 2021-05-07 NOTE — Assessment & Plan Note (Signed)
Noted on exam, to leg  Recommend monitor skin closely for breakdown and alert provider if present.  Gentle skin care at home.  CBC ordered today. ?

## 2021-05-08 ENCOUNTER — Other Ambulatory Visit: Payer: Self-pay | Admitting: Nurse Practitioner

## 2021-05-08 DIAGNOSIS — E871 Hypo-osmolality and hyponatremia: Secondary | ICD-10-CM

## 2021-05-08 LAB — CBC WITH DIFFERENTIAL/PLATELET
Basophils Absolute: 0.1 10*3/uL (ref 0.0–0.2)
Basos: 1 %
EOS (ABSOLUTE): 0.1 10*3/uL (ref 0.0–0.4)
Eos: 1 %
Hematocrit: 38.2 % (ref 34.0–46.6)
Hemoglobin: 13 g/dL (ref 11.1–15.9)
Immature Grans (Abs): 0 10*3/uL (ref 0.0–0.1)
Immature Granulocytes: 0 %
Lymphocytes Absolute: 3 10*3/uL (ref 0.7–3.1)
Lymphs: 50 %
MCH: 31.9 pg (ref 26.6–33.0)
MCHC: 34 g/dL (ref 31.5–35.7)
MCV: 94 fL (ref 79–97)
Monocytes Absolute: 0.4 10*3/uL (ref 0.1–0.9)
Monocytes: 7 %
Neutrophils Absolute: 2.5 10*3/uL (ref 1.4–7.0)
Neutrophils: 41 %
Platelets: 330 10*3/uL (ref 150–450)
RBC: 4.07 x10E6/uL (ref 3.77–5.28)
RDW: 12 % (ref 11.7–15.4)
WBC: 6 10*3/uL (ref 3.4–10.8)

## 2021-05-08 LAB — COMPREHENSIVE METABOLIC PANEL
ALT: 12 IU/L (ref 0–32)
AST: 23 IU/L (ref 0–40)
Albumin/Globulin Ratio: 2.3 — ABNORMAL HIGH (ref 1.2–2.2)
Albumin: 4.6 g/dL (ref 3.7–4.7)
Alkaline Phosphatase: 69 IU/L (ref 44–121)
BUN/Creatinine Ratio: 12 (ref 12–28)
BUN: 10 mg/dL (ref 8–27)
Bilirubin Total: <0.2 mg/dL (ref 0.0–1.2)
CO2: 29 mmol/L (ref 20–29)
Calcium: 9.8 mg/dL (ref 8.7–10.3)
Chloride: 93 mmol/L — ABNORMAL LOW (ref 96–106)
Creatinine, Ser: 0.83 mg/dL (ref 0.57–1.00)
Globulin, Total: 2 g/dL (ref 1.5–4.5)
Glucose: 58 mg/dL — ABNORMAL LOW (ref 70–99)
Potassium: 4.2 mmol/L (ref 3.5–5.2)
Sodium: 130 mmol/L — ABNORMAL LOW (ref 134–144)
Total Protein: 6.6 g/dL (ref 6.0–8.5)
eGFR: 75 mL/min/{1.73_m2} (ref >59)

## 2021-05-08 LAB — LIPID PANEL W/O CHOL/HDL RATIO
Cholesterol, Total: 185 mg/dL (ref 100–199)
HDL: 84 mg/dL (ref >39)
LDL Chol Calc (NIH): 83 mg/dL (ref 0–99)
Triglycerides: 100 mg/dL (ref 0–149)
VLDL Cholesterol Cal: 18 mg/dL (ref 5–40)

## 2021-05-08 LAB — VITAMIN D 25 HYDROXY (VIT D DEFICIENCY, FRACTURES): Vit D, 25-Hydroxy: 79.3 ng/mL (ref 30.0–100.0)

## 2021-05-08 LAB — TSH: TSH: 1.2 u[IU]/mL (ref 0.450–4.500)

## 2021-05-08 NOTE — Progress Notes (Signed)
Contacted via MyChart -- needs 2 week lab only visit scheduled please ? ? ?Good afternoon Debbie, your labs have returned: ?- Glucose, sugar, was a little low, but that was probably from you fasting. ?- Sodium was low, often you are in 140 range of normal, this check it is low.  At times medications like Lexapro can lower this level.  I want you to continue this medication, but please add some table salt to diet daily and I would like to recheck this on outpatient labs in 2 weeks please.  Staff will call to schedule this with you. ?- Kidney function, creatinine and eGFR, remains normal, as is liver function, AST and ALT.  ?- Remainder of labs look great.  Any questions? ?Keep being amazing!!  Thank you for allowing me to participate in your care.  I appreciate you. ?Kindest regards, ?Gee Habig ?

## 2021-05-09 ENCOUNTER — Telehealth: Payer: Self-pay | Admitting: Nurse Practitioner

## 2021-05-09 LAB — DRUG SCREEN 764883 11+OXYCO+ALC+CRT-BUND
Amphetamines, Urine: NEGATIVE ng/mL
BENZODIAZ UR QL: NEGATIVE ng/mL
Barbiturate: NEGATIVE ng/mL
Cannabinoid Quant, Ur: NEGATIVE ng/mL
Cocaine (Metabolite): NEGATIVE ng/mL
Creatinine: 66.7 mg/dL (ref 20.0–300.0)
Ethanol: NEGATIVE %
Meperidine: NEGATIVE ng/mL
Methadone Screen, Urine: NEGATIVE ng/mL
OPIATE SCREEN URINE: NEGATIVE ng/mL
Oxycodone/Oxymorphone, Urine: NEGATIVE ng/mL
Phencyclidine: NEGATIVE ng/mL
Propoxyphene: NEGATIVE ng/mL
Tramadol: NEGATIVE ng/mL
pH, Urine: 6.8 (ref 4.5–8.9)

## 2021-05-09 NOTE — Telephone Encounter (Signed)
Called and discussed lab results with patient per result note. Lab appointment scheduled for 05/21/21 ?

## 2021-05-09 NOTE — Telephone Encounter (Signed)
Pt is calling to change the number to be contacted on for lab results CB- [phone] ?

## 2021-05-09 NOTE — Telephone Encounter (Signed)
Pt is calling because she received a vm to redo her labs. Pt is unable to check her Mychart to receive her lab results. Pt is concerned and would like someone to please advise her of her lab results. ?[phone]:[id number] before 3:30p ?

## 2021-05-12 DIAGNOSIS — H35039 Hypertensive retinopathy, unspecified eye: Secondary | ICD-10-CM | POA: Diagnosis not present

## 2021-05-12 DIAGNOSIS — H40013 Open angle with borderline findings, low risk, bilateral: Secondary | ICD-10-CM | POA: Diagnosis not present

## 2021-05-12 DIAGNOSIS — H2510 Age-related nuclear cataract, unspecified eye: Secondary | ICD-10-CM | POA: Diagnosis not present

## 2021-05-21 ENCOUNTER — Other Ambulatory Visit: Payer: Self-pay

## 2021-05-21 ENCOUNTER — Other Ambulatory Visit: Payer: PPO

## 2021-05-21 DIAGNOSIS — E871 Hypo-osmolality and hyponatremia: Secondary | ICD-10-CM

## 2021-05-22 LAB — BASIC METABOLIC PANEL
BUN/Creatinine Ratio: 17 (ref 12–28)
BUN: 14 mg/dL (ref 8–27)
CO2: 24 mmol/L (ref 20–29)
Calcium: 10.2 mg/dL (ref 8.7–10.3)
Chloride: 100 mmol/L (ref 96–106)
Creatinine, Ser: 0.81 mg/dL (ref 0.57–1.00)
Glucose: 97 mg/dL (ref 70–99)
Potassium: 4.6 mmol/L (ref 3.5–5.2)
Sodium: 141 mmol/L (ref 134–144)
eGFR: 77 mL/min/{1.73_m2} (ref >59)

## 2021-05-22 NOTE — Progress Notes (Signed)
Contacted via MyChart ? ? ?Good afternoon Brandy Parker, your sodium level is much improved this check.  Great news.  Continue all current medications. ?Keep being stellar!!  Thank you for allowing me to participate in your care.  I appreciate you. ?Kindest regards, ?Ninetta Adelstein ?

## 2021-05-29 ENCOUNTER — Ambulatory Visit (INDEPENDENT_AMBULATORY_CARE_PROVIDER_SITE_OTHER): Payer: PPO | Admitting: *Deleted

## 2021-05-29 DIAGNOSIS — Z0000 Encounter for general adult medical examination without abnormal findings: Secondary | ICD-10-CM

## 2021-05-29 DIAGNOSIS — Z780 Asymptomatic menopausal state: Secondary | ICD-10-CM

## 2021-05-29 NOTE — Progress Notes (Signed)
? ?Subjective:  ? Brandy Parker is a 71 y.o. female who presents for Medicare Annual (Subsequent) preventive examination. ? ?I connected with  JAPJI KOK on 05/29/21 by a telephone enabled telemedicine application and verified that I am speaking with the correct person using two identifiers. ?  ?I discussed the limitations of evaluation and management by telemedicine. The patient expressed understanding and agreed to proceed. ? ?Patient location: home ? ?Provider location:  Tele-Health not in office ? ? ?Review of Systems    ? ?Cardiac Risk Factors include: advanced age (>70men, >40 women);smoking/ tobacco exposure ? ?   ?Objective:  ?  ?Today's Vitals  ? ?There is no height or weight on file to calculate BMI. ? ? ?  05/29/2021  ? 11:21 AM 05/27/2020  ? 11:20 AM 05/18/2019  ?  8:50 AM 07/20/2016  ?  1:08 PM 07/17/2016  ?  7:45 PM  ?Advanced Directives  ?Does Patient Have a Medical Advance Directive? No No No No No  ?Would patient like information on creating a medical advance directive? No - Patient declined   No - Patient declined No - Patient declined  ? ? ?Current Medications (verified) ?Outpatient Encounter Medications as of 05/29/2021  ?Medication Sig  ? ALPRAZolam (XANAX) 0.25 MG tablet Take 1 tablet (0.25 mg total) by mouth at bedtime as needed for anxiety.  ? cholecalciferol (VITAMIN D3) 25 MCG (1000 UNIT) tablet Take 1,000 Units by mouth daily.  ? escitalopram (LEXAPRO) 10 MG tablet Take 1 tablet (10 mg total) by mouth daily.  ? Multiple Vitamin (MULTIVITAMIN) tablet Take 1 tablet by mouth daily.  ? simvastatin (ZOCOR) 20 MG tablet Take 1 tablet (20 mg total) by mouth daily at 6 PM.  ? ?No facility-administered encounter medications on file as of 05/29/2021.  ? ? ?Allergies (verified) ?Sulfa antibiotics  ? ?History: ?Past Medical History:  ?Diagnosis Date  ? Anxiety   ? Hyperlipemia   ? ?Past Surgical History:  ?Procedure Laterality Date  ? ORIF ANKLE FRACTURE Left 07/20/2016  ? Procedure: OPEN REDUCTION  INTERNAL FIXATION (ORIF) ANKLE FRACTURE;  Surgeon: Lyndle Herrlich, MD;  Location: [facility] ORS;  Service: Orthopedics;  Laterality: Left;  ? ?Family History  ?Problem Relation Age of Onset  ? Hypertension Mother   ? Heart failure Mother   ? Hypertension Father   ? Hyperlipidemia Father   ? Cancer Father   ?     bladder  ? Heart failure Father   ? Cancer Sister   ?     bladder  ? Breast cancer Neg Hx   ? ?Social History  ? ?Socioeconomic History  ? Marital status: Single  ?  Spouse name: Not on file  ? Number of children: Not on file  ? Years of education: Not on file  ? Highest education level: Not on file  ?Occupational History  ? Occupation: fulltime   ?  Employer: [employer]  ?Tobacco Use  ? Smoking status: Every Day  ?  Packs/day: 0.25  ?  Types: Cigarettes  ? Smokeless tobacco: Never  ?Vaping Use  ? Vaping Use: Never used  ?Substance and Sexual Activity  ? Alcohol use: No  ? Drug use: No  ? Sexual activity: Not Currently  ?Other Topics Concern  ? Not on file  ?Social History Narrative  ? Not on file  ? ?Social Determinants of Health  ? ?Financial Resource Strain: Not on file  ?Food Insecurity: No Food Insecurity  ? Worried About Programme researcher, broadcasting/film/video  in the Last Year: Never true  ? Ran Out of Food in the Last Year: Never true  ?Transportation Needs: No Transportation Needs  ? Lack of Transportation (Medical): No  ? Lack of Transportation (Non-Medical): No  ?Physical Activity: Inactive  ? Days of Exercise per Week: 0 days  ? Minutes of Exercise per Session: 0 min  ?Stress: Not on file  ?Social Connections: Unknown  ? Frequency of Communication with Friends and Family: More than three times a week  ? Frequency of Social Gatherings with Friends and Family: Once a week  ? Attends Religious Services: Never  ? Active Member of Clubs or Organizations: No  ? Attends BankerClub or Organization Meetings: Never  ? Marital Status: Not on file  ? ? ?Tobacco Counseling ?Ready to quit: Not Answered ?Counseling given: Not  Answered ? ? ?Clinical Intake: ? ?Pre-visit preparation completed: Yes ? ?Pain : No/denies pain ? ?  ? ?Nutritional Risks: None ?Diabetes: No ? ?How often do you need to have someone help you when you read instructions, pamphlets, or other written materials from your doctor or pharmacy?: 1 - Never ? ?Diabetic?  no ? ?Interpreter Needed?: No ? ?Information entered by :: Remi HaggardJulie Anastasios Melander LPN ? ? ?Activities of Daily Living ? ?  05/29/2021  ? 11:20 AM  ?In your present state of health, do you have any difficulty performing the following activities:  ?Hearing? 0  ?Vision? 0  ?Difficulty concentrating or making decisions? 0  ?Walking or climbing stairs? 0  ?Dressing or bathing? 0  ?Doing errands, shopping? 0  ?Preparing Food and eating ? N  ?Using the Toilet? N  ?In the past six months, have you accidently leaked urine? N  ?Do you have problems with loss of bowel control? N  ?Managing your Medications? N  ?Managing your Finances? N  ?Housekeeping or managing your Housekeeping? N  ? ? ?Patient Care Team: ?Marjie Skiffannady, Jolene T, NP as PCP - General (Nurse Practitioner) ? ?Indicate any recent Medical Services you may have received from other than [facility] providers in the past year (date may be approximate). ? ?   ?Assessment:  ? This is a routine wellness examination for Stanton KidneyDebra. ? ?Hearing/Vision screen ?Hearing Screening - Comments:: No trouble hearing ?Vision Screening - Comments:: Up  to date ?[place] ? ?Dietary issues and exercise activities discussed: ?Current Exercise Habits: The patient does not participate in regular exercise at present (patient does still work at [employer]), Exercise limited by: None identified ? ? Goals Addressed   ? ?  ?  ?  ?  ? This Visit's Progress  ?  Patient Stated     ?  No goals ?  ? ?  ? ?Depression Screen ? ?  05/29/2021  ? 11:22 AM 05/07/2021  ?  1:05 PM 02/03/2021  ?  3:01 PM 11/01/2020  ?  1:46 PM 07/31/2020  ?  2:10 PM 05/27/2020  ? 11:21 AM 05/03/2020  ?  1:09 PM  ?PHQ 2/9 Scores  ?PHQ - 2 Score 0 0 0 0  0 0 0  ?PHQ- 9 Score 0 0 0 3 0  0  ?  ?Fall Risk ? ?  05/29/2021  ? 11:19 AM 05/07/2021  ?  1:06 PM 05/27/2020  ? 11:21 AM 05/18/2019  ?  8:44 AM 03/31/2019  ?  3:02 PM  ?Fall Risk   ?Falls in the past year? 0 0 0 0 0  ?Number falls in past yr: 0 0  0 0  ?Injury  with Fall? 0 1  0 0  ?Risk for fall due to :  History of fall(s) Medication side effect    ?Follow up Falls evaluation completed;Falls prevention discussed;Education provided Falls evaluation completed Falls evaluation completed;Education provided;Falls prevention discussed  Falls evaluation completed  ? ? ?FALL RISK PREVENTION PERTAINING TO THE HOME: ? ?Any stairs in or around the home? No  ?If so, are there any without handrails? No  ?Home free of loose throw rugs in walkways, pet beds, electrical cords, etc? Yes  ?Adequate lighting in your home to reduce risk of falls? Yes  ? ?ASSISTIVE DEVICES UTILIZED TO PREVENT FALLS: ? ?Life alert? No  ?Use of a cane, walker or w/c? No  ?Grab bars in the bathroom? No  ?Shower chair or bench in shower? No  ?Elevated toilet seat or a handicapped toilet? No  ? ?TIMED UP AND GO: ? ?Was the test performed? No .  ? ? ?Cognitive Function: ? ?Normal cognitive status assessed by direct observation by this Nurse Health Advisor. No abnormalities found.    ?  ? ?  05/27/2020  ? 11:23 AM  ?6CIT Screen  ?What Year? 0 points  ?What month? 0 points  ?What time? 0 points  ?Count back from 20 0 points  ?Months in reverse 0 points  ?Repeat phrase 0 points  ?Total Score 0 points  ? ? ?Immunizations ?Immunization History  ?Administered Date(s) Administered  ? Fluad Quad(high Dose 65+) 01/26/2020, 05/07/2021  ? Influenza,inj,quad, With Preservative 01/02/2019  ? PFIZER(Purple Top)SARS-COV-2 Vaccination 07/06/2019, 08/01/2019, 02/14/2020, 11/05/2020  ? Pneumococcal Conjugate-13 03/31/2019  ? Pneumococcal Polysaccharide-23 05/03/2020  ? Tdap 12/15/2019  ? ? ?TDAP status: Up to date ? ?Flu Vaccine status: Up to date ? ?Pneumococcal vaccine status:  Up to date ? ?Covid-19 vaccine status: Information provided on how to obtain vaccines.  ? ?Qualifies for Shingles Vaccine? Yes   ?Zostavax completed No   ?Shingrix Completed?: No.    Education has been provided regarding the

## 2021-05-29 NOTE — Patient Instructions (Addendum)
Brandy Parker , ?Thank you for taking time to come for your Medicare Wellness Visit. I appreciate your ongoing commitment to your health goals. Please review the following plan we discussed and let me know if I can assist you in the future.  ? ?Screening recommendations/referrals: ?Colonoscopy: Education provided ?Mammogram: Education provided ?Bone Density: Education provided ?Recommended yearly ophthalmology/optometry visit for glaucoma screening and checkup ?Recommended yearly dental visit for hygiene and checkup ? ?Vaccinations: ?Influenza vaccine: up to date ?Pneumococcal vaccine: up to date ?Tdap vaccine: up to date ?Shingles vaccine: Education provided   ? ?Advanced directives: Education provided ? ?Conditions/risks identified:  ? ?Next appointment: 08-06-2021 @ 1:00   [place] ? ? ?Preventive Care 35 Years and Older, Female ?Preventive care refers to lifestyle choices and visits with your health care provider that can promote health and wellness. ?What does preventive care include? ?A yearly physical exam. This is also called an annual well check. ?Dental exams once or twice a year. ?Routine eye exams. Ask your health care provider how often you should have your eyes checked. ?Personal lifestyle choices, including: ?Daily care of your teeth and gums. ?Regular physical activity. ?Eating a healthy diet. ?Avoiding tobacco and drug use. ?Limiting alcohol use. ?Practicing safe sex. ?Taking low-dose aspirin every day. ?Taking vitamin and mineral supplements as recommended by your health care provider. ?What happens during an annual well check? ?The services and screenings done by your health care provider during your annual well check will depend on your age, overall health, lifestyle risk factors, and family history of disease. ?Counseling  ?Your health care provider may ask you questions about your: ?Alcohol use. ?Tobacco use. ?Drug use. ?Emotional well-being. ?Home and relationship well-being. ?Sexual  activity. ?Eating habits. ?History of falls. ?Memory and ability to understand (cognition). ?Work and work Astronomer. ?Reproductive health. ?Screening  ?You may have the following tests or measurements: ?Height, weight, and BMI. ?Blood pressure. ?Lipid and cholesterol levels. These may be checked every 5 years, or more frequently if you are over 75 years old. ?Skin check. ?Lung cancer screening. You may have this screening every year starting at age 62 if you have a 30-pack-year history of smoking and currently smoke or have quit within the past 15 years. ?Fecal occult blood test (FOBT) of the stool. You may have this test every year starting at age 68. ?Flexible sigmoidoscopy or colonoscopy. You may have a sigmoidoscopy every 5 years or a colonoscopy every 10 years starting at age 4. ?Hepatitis C blood test. ?Hepatitis B blood test. ?Sexually transmitted disease (STD) testing. ?Diabetes screening. This is done by checking your blood sugar (glucose) after you have not eaten for a while (fasting). You may have this done every 1-3 years. ?Bone density scan. This is done to screen for osteoporosis. You may have this done starting at age 78. ?Mammogram. This may be done every 1-2 years. Talk to your health care provider about how often you should have regular mammograms. ?Talk with your health care provider about your test results, treatment options, and if necessary, the need for more tests. ?Vaccines  ?Your health care provider may recommend certain vaccines, such as: ?Influenza vaccine. This is recommended every year. ?Tetanus, diphtheria, and acellular pertussis (Tdap, Td) vaccine. You may need a Td booster every 10 years. ?Zoster vaccine. You may need this after age 66. ?Pneumococcal 13-valent conjugate (PCV13) vaccine. One dose is recommended after age 110. ?Pneumococcal polysaccharide (PPSV23) vaccine. One dose is recommended after age 49. ?Talk to your health care provider about  which screenings and vaccines  you need and how often you need them. ?This information is not intended to replace advice given to you by your health care provider. Make sure you discuss any questions you have with your health care provider. ?Document Released: 03/22/2015 Document Revised: 11/13/2015 Document Reviewed: 12/25/2014 ?Elsevier Interactive Patient Education ? 2017 [place]. ? ?Fall Prevention in the Home ?Falls can cause injuries. They can happen to people of all ages. There are many things you can do to make your home safe and to help prevent falls. ?What can I do on the outside of my home? ?Regularly fix the edges of walkways and driveways and fix any cracks. ?Remove anything that might make you trip as you walk through a door, such as a raised step or threshold. ?Trim any bushes or trees on the path to your home. ?Use bright outdoor lighting. ?Clear any walking paths of anything that might make someone trip, such as rocks or tools. ?Regularly check to see if handrails are loose or broken. Make sure that both sides of any steps have handrails. ?Any raised decks and porches should have guardrails on the edges. ?Have any leaves, snow, or ice cleared regularly. ?Use sand or salt on walking paths during winter. ?Clean up any spills in your garage right away. This includes oil or grease spills. ?What can I do in the bathroom? ?Use night lights. ?Install grab bars by the toilet and in the tub and shower. Do not use towel bars as grab bars. ?Use non-skid mats or decals in the tub or shower. ?If you need to sit down in the shower, use a plastic, non-slip stool. ?Keep the floor dry. Clean up any water that spills on the floor as soon as it happens. ?Remove soap buildup in the tub or shower regularly. ?Attach bath mats securely with double-sided non-slip rug tape. ?Do not have throw rugs and other things on the floor that can make you trip. ?What can I do in the bedroom? ?Use night lights. ?Make sure that you have a light by your bed that  is easy to reach. ?Do not use any sheets or blankets that are too big for your bed. They should not hang down onto the floor. ?Have a firm chair that has side arms. You can use this for support while you get dressed. ?Do not have throw rugs and other things on the floor that can make you trip. ?What can I do in the kitchen? ?Clean up any spills right away. ?Avoid walking on wet floors. ?Keep items that you use a lot in easy-to-reach places. ?If you need to reach something above you, use a strong step stool that has a grab bar. ?Keep electrical cords out of the way. ?Do not use floor polish or wax that makes floors slippery. If you must use wax, use non-skid floor wax. ?Do not have throw rugs and other things on the floor that can make you trip. ?What can I do with my stairs? ?Do not leave any items on the stairs. ?Make sure that there are handrails on both sides of the stairs and use them. Fix handrails that are broken or loose. Make sure that handrails are as long as the stairways. ?Check any carpeting to make sure that it is firmly attached to the stairs. Fix any carpet that is loose or worn. ?Avoid having throw rugs at the top or bottom of the stairs. If you do have throw rugs, attach them to the floor with  carpet tape. ?Make sure that you have a light switch at the top of the stairs and the bottom of the stairs. If you do not have them, ask someone to add them for you. ?What else can I do to help prevent falls? ?Wear shoes that: ?Do not have high heels. ?Have rubber bottoms. ?Are comfortable and fit you well. ?Are closed at the toe. Do not wear sandals. ?If you use a stepladder: ?Make sure that it is fully opened. Do not climb a closed stepladder. ?Make sure that both sides of the stepladder are locked into place. ?Ask someone to hold it for you, if possible. ?Clearly mark and make sure that you can see: ?Any grab bars or handrails. ?First and last steps. ?Where the edge of each step is. ?Use tools that help you  move around (mobility aids) if they are needed. These include: ?Canes. ?Walkers. ?Scooters. ?Crutches. ?Turn on the lights when you go into a dark area. Replace any light bulbs as soon as they burn out. ?Set up your

## 2021-05-30 ENCOUNTER — Ambulatory Visit: Payer: PPO

## 2021-08-03 NOTE — Patient Instructions (Signed)

## 2021-08-06 ENCOUNTER — Ambulatory Visit (INDEPENDENT_AMBULATORY_CARE_PROVIDER_SITE_OTHER): Payer: PPO | Admitting: Nurse Practitioner

## 2021-08-06 ENCOUNTER — Encounter: Payer: Self-pay | Admitting: Nurse Practitioner

## 2021-08-06 VITALS — BP 137/73 | HR 69 | Temp 98.8°F | Ht 61.5 in | Wt 108.4 lb

## 2021-08-06 DIAGNOSIS — Z79899 Other long term (current) drug therapy: Secondary | ICD-10-CM

## 2021-08-06 DIAGNOSIS — F411 Generalized anxiety disorder: Secondary | ICD-10-CM

## 2021-08-06 MED ORDER — ALPRAZOLAM 0.25 MG PO TABS: 0.2500 mg | ORAL_TABLET | Freq: Every evening | ORAL | 0 refills | Status: DC | PRN

## 2021-08-06 NOTE — Progress Notes (Signed)
.cfpb   BP 137/73   Pulse 69   Temp 98.8 F (37.1 C) (Oral)   Ht 5' 1.5" (1.562 m)   Wt 108 lb 6.4 oz (49.2 kg)   SpO2 96%   BMI 20.15 kg/m    Subjective:    Patient ID: Brandy Parker, female    DOB: 01-15-1951, 71 y.o.   MRN: [id number]  HPI: Brandy Parker is a 71 y.o. female  Chief Complaint  Patient presents with   Mood    Patient is here for a three month follow up on Mood. Patient denies having any concerns at today's visit.    ANXIETY/STRESS Taking Xanax 0.25 MG PRN (is using it maybe 5 nights a week) and Lexapro 10 MG daily.   Been on Xanax for a long while per her report, several years (2008).  Pt is aware of risks of benzo medication use to include increased sedation, respiratory suppression, falls, dependence and cardiovascular events. Pt would like to continue treatment as benefit determined to outweigh risk.  On PDMP review her last fill Xanax was 03/07/21 for 90 day supply. Previously got 90 day supply and would like to continue this.   Duration:stable Anxious mood: no  Excessive worrying: no Irritability: no  Sweating: no Nausea: no Palpitations:no Hyperventilation: no Panic attacks: no Agoraphobia: no  Obscessions/compulsions: no Depressed mood: no    08/06/2021    1:06 PM 05/29/2021   11:22 AM 05/07/2021    1:05 PM 02/03/2021    3:01 PM 11/01/2020    1:46 PM  Depression screen PHQ 2/9  Decreased Interest 0 0 0 0 0  Down, Depressed, Hopeless 0 0 0 0 0  PHQ - 2 Score 0 0 0 0 0  Altered sleeping 0 0 0 0 3  Tired, decreased energy 0 0 0 0 0  Change in appetite 0 0 0 0 0  Feeling bad or failure about yourself  0 0 0 0 0  Trouble concentrating 0 0 0 0 0  Moving slowly or fidgety/restless 0 0 0 0 0  Suicidal thoughts 0 0 0 0 0  PHQ-9 Score 0 0 0 0 3  Difficult doing work/chores Not difficult at all   Not difficult at all Not difficult at all      08/06/2021    1:06 PM 05/07/2021    1:06 PM 02/03/2021    3:01 PM 11/01/2020    1:45 PM  GAD 7 :  Generalized Anxiety Score  Nervous, Anxious, on Edge 0 0 0 0  Control/stop worrying 0 0 0 0  Worry too much - different things 0 0 0 0  Trouble relaxing 0 0 0 0  Restless 0 0 0 3  Easily annoyed or irritable 0 0 0 0  Afraid - awful might happen 0 0 0 0  Total GAD 7 Score 0 0 0 3  Anxiety Difficulty Not difficult at all Not difficult at all Not difficult at all Not difficult at all   Relevant past medical, surgical, family and social history reviewed and updated as indicated. Interim medical history since our last visit reviewed. Allergies and medications reviewed and updated.  Review of Systems  Constitutional:  Negative for activity change, appetite change, diaphoresis, fatigue and fever.  Respiratory:  Negative for cough, chest tightness and shortness of breath.   Cardiovascular:  Negative for chest pain, palpitations and leg swelling.  Gastrointestinal: Negative.   Neurological: Negative.   Psychiatric/Behavioral:  Negative for decreased concentration, self-injury, sleep  disturbance and suicidal ideas. The patient is not nervous/anxious.    Per HPI unless specifically indicated above     Objective:    BP 137/73   Pulse 69   Temp 98.8 F (37.1 C) (Oral)   Ht 5' 1.5" (1.562 m)   Wt 108 lb 6.4 oz (49.2 kg)   SpO2 96%   BMI 20.15 kg/m   Wt Readings from Last 3 Encounters:  08/06/21 108 lb 6.4 oz (49.2 kg)  05/07/21 111 lb 9.6 oz (50.6 kg)  02/03/21 109 lb 9.6 oz (49.7 kg)    Physical Exam Vitals and nursing note reviewed.  Constitutional:      General: She is awake. She is not in acute distress.    Appearance: She is well-developed and well-groomed. She is not ill-appearing.  HENT:     Head: Normocephalic.     Right Ear: Hearing normal.     Left Ear: Hearing normal.  Eyes:     General: Lids are normal.        Right eye: No discharge.        Left eye: No discharge.     Conjunctiva/sclera: Conjunctivae normal.     Pupils: Pupils are equal, round, and reactive to  light.  Neck:     Vascular: No carotid bruit.  Cardiovascular:     Rate and Rhythm: Normal rate and regular rhythm.     Heart sounds: Normal heart sounds. No murmur heard.   No gallop.  Pulmonary:     Effort: Pulmonary effort is normal. No accessory muscle usage or respiratory distress.     Breath sounds: Normal breath sounds.  Abdominal:     General: Bowel sounds are normal.     Palpations: Abdomen is soft.  Musculoskeletal:     Cervical back: Normal range of motion and neck supple.     Right lower leg: No edema.     Left lower leg: No edema.  Skin:    General: Skin is warm and dry.  Neurological:     Mental Status: She is alert and oriented to person, place, and time.  Psychiatric:        Attention and Perception: Attention normal.        Mood and Affect: Mood normal.        Speech: Speech normal.        Behavior: Behavior normal. Behavior is cooperative.        Thought Content: Thought content normal.   Results for orders placed or performed in visit on 24/26/83  Basic metabolic panel  Result Value Ref Range   Glucose 97 70 - 99 mg/dL   BUN 14 8 - 27 mg/dL   Creatinine, Ser 0.81 0.57 - 1.00 mg/dL   eGFR 77 >59 mL/min/1.73   BUN/Creatinine Ratio 17 12 - 28   Sodium 141 134 - 144 mmol/L   Potassium 4.6 3.5 - 5.2 mmol/L   Chloride 100 96 - 106 mmol/L   CO2 24 20 - 29 mmol/L   Calcium 10.2 8.7 - 10.3 mg/dL      Assessment & Plan:   Problem List Items Addressed This Visit       Other   Generalized anxiety disorder - Primary    Chronic, stable, has reduced use of Xanax.  Only taking Xanax 0.25 MG at night 5 days a week.  Have sent in refill 0.25 MG QHS #90 tablets with 0 refills.  Had discussion about BEERS criteria and risks with these.  Will continue to monitor, she is up to date on contract and UDS.  Continue Lexapro.  She agrees to every 3 month visits, will return in 3 months.       Relevant Medications   ALPRAZolam (XANAX) 0.25 MG tablet   Long-term current  use of benzodiazepine    Refer to anxiety plan of care.         Follow up plan: Return in about 3 months (around 11/06/2021) for MOOD.

## 2021-08-06 NOTE — Assessment & Plan Note (Signed)
Refer to anxiety plan of care. 

## 2021-08-06 NOTE — Assessment & Plan Note (Signed)
Chronic, stable, has reduced use of Xanax.  Only taking Xanax 0.25 MG at night 5 days a week.  Have sent in refill 0.25 MG QHS #90 tablets with 0 refills.  Had discussion about BEERS criteria and risks with these.   Will continue to monitor, she is up to date on contract and UDS.  Continue Lexapro.  She agrees to every 3 month visits, will return in 3 months.

## 2021-11-01 NOTE — Patient Instructions (Signed)

## 2021-11-07 ENCOUNTER — Ambulatory Visit (INDEPENDENT_AMBULATORY_CARE_PROVIDER_SITE_OTHER): Payer: PPO | Admitting: Nurse Practitioner

## 2021-11-07 ENCOUNTER — Encounter: Payer: Self-pay | Admitting: Nurse Practitioner

## 2021-11-07 DIAGNOSIS — Z79899 Other long term (current) drug therapy: Secondary | ICD-10-CM

## 2021-11-07 DIAGNOSIS — F411 Generalized anxiety disorder: Secondary | ICD-10-CM | POA: Diagnosis not present

## 2021-11-07 MED ORDER — ALPRAZOLAM 0.25 MG PO TABS
0.2500 mg | ORAL_TABLET | Freq: Every evening | ORAL | 0 refills | Status: DC | PRN
Start: 2021-11-26 — End: 2022-02-13

## 2021-11-07 NOTE — Progress Notes (Signed)
.cfpb   BP 128/72 (BP Location: Left Arm, Patient Position: Sitting, Cuff Size: Normal)   Pulse 79   Temp 98.6 F (37 C) (Oral)   Ht 5' 1.5" (1.562 m)   Wt 108 lb (49 kg)   SpO2 96%   BMI 20.08 kg/m    Subjective:    Patient ID: Brandy Parker, female    DOB: 1950-07-03, 71 y.o.   MRN: [id number]  HPI: Brandy Parker is a 71 y.o. female  Chief Complaint  Patient presents with   Anxiety   ANXIETY/STRESS Continues Xanax 0.25 MG PRN (5 nights a week) and Lexapro 10 MG daily.   Been on Xanax for a long while per her report, several years (2008).  Pt is aware of risks of benzo medication use to include increased sedation, respiratory suppression, falls, dependence and cardiovascular events. Pt would like to continue treatment as benefit determined to outweigh risk.  On PDMP review her last fill Xanax was 08/26/21 for 90 day supply. Previously got 90 day supply and would like to continue this.   Duration:stable Anxious mood: no  Excessive worrying: no Irritability: no  Sweating: no Nausea: no Palpitations:no Hyperventilation: no Panic attacks: no Agoraphobia: no  Obscessions/compulsions: no Depressed mood: no    11/07/2021    1:16 PM 08/06/2021    1:06 PM 05/29/2021   11:22 AM 05/07/2021    1:05 PM 02/03/2021    3:01 PM  Depression screen PHQ 2/9  Decreased Interest 0 0 0 0 0  Down, Depressed, Hopeless 0 0 0 0 0  PHQ - 2 Score 0 0 0 0 0  Altered sleeping 0 0 0 0 0  Tired, decreased energy 0 0 0 0 0  Change in appetite 0 0 0 0 0  Feeling bad or failure about yourself  0 0 0 0 0  Trouble concentrating 0 0 0 0 0  Moving slowly or fidgety/restless 0 0 0 0 0  Suicidal thoughts 0 0 0 0 0  PHQ-9 Score 0 0 0 0 0  Difficult doing work/chores Not difficult at all Not difficult at all   Not difficult at all      11/07/2021    1:16 PM 08/06/2021    1:06 PM 05/07/2021    1:06 PM 02/03/2021    3:01 PM  GAD 7 : Generalized Anxiety Score  Nervous, Anxious, on Edge 0 0 0 0   Control/stop worrying 0 0 0 0  Worry too much - different things 0 0 0 0  Trouble relaxing 0 0 0 0  Restless 0 0 0 0  Easily annoyed or irritable 0 0 0 0  Afraid - awful might happen 0 0 0 0  Total GAD 7 Score 0 0 0 0  Anxiety Difficulty Not difficult at all Not difficult at all Not difficult at all Not difficult at all   Relevant past medical, surgical, family and social history reviewed and updated as indicated. Interim medical history since our last visit reviewed. Allergies and medications reviewed and updated.  Review of Systems  Constitutional:  Negative for activity change, appetite change, diaphoresis, fatigue and fever.  Respiratory:  Negative for cough, chest tightness and shortness of breath.   Cardiovascular:  Negative for chest pain, palpitations and leg swelling.  Gastrointestinal: Negative.   Neurological: Negative.   Psychiatric/Behavioral:  Negative for decreased concentration, self-injury, sleep disturbance and suicidal ideas. The patient is not nervous/anxious.     Per HPI unless specifically indicated above  Objective:    BP 128/72 (BP Location: Left Arm, Patient Position: Sitting, Cuff Size: Normal)   Pulse 79   Temp 98.6 F (37 C) (Oral)   Ht 5' 1.5" (1.562 m)   Wt 108 lb (49 kg)   SpO2 96%   BMI 20.08 kg/m   Wt Readings from Last 3 Encounters:  11/07/21 108 lb (49 kg)  08/06/21 108 lb 6.4 oz (49.2 kg)  05/07/21 111 lb 9.6 oz (50.6 kg)    Physical Exam Vitals and nursing note reviewed.  Constitutional:      General: She is awake. She is not in acute distress.    Appearance: She is well-developed and well-groomed. She is not ill-appearing.  HENT:     Head: Normocephalic.     Right Ear: Hearing normal.     Left Ear: Hearing normal.  Eyes:     General: Lids are normal.        Right eye: No discharge.        Left eye: No discharge.     Conjunctiva/sclera: Conjunctivae normal.     Pupils: Pupils are equal, round, and reactive to light.   Neck:     Vascular: No carotid bruit.  Cardiovascular:     Rate and Rhythm: Normal rate and regular rhythm.     Heart sounds: Normal heart sounds. No murmur heard.    No gallop.  Pulmonary:     Effort: Pulmonary effort is normal. No accessory muscle usage or respiratory distress.     Breath sounds: Normal breath sounds.  Abdominal:     General: Bowel sounds are normal.     Palpations: Abdomen is soft.  Musculoskeletal:     Cervical back: Normal range of motion and neck supple.     Right lower leg: No edema.     Left lower leg: No edema.  Skin:    General: Skin is warm and dry.  Neurological:     Mental Status: She is alert and oriented to person, place, and time.  Psychiatric:        Attention and Perception: Attention normal.        Mood and Affect: Mood normal.        Speech: Speech normal.        Behavior: Behavior normal. Behavior is cooperative.        Thought Content: Thought content normal.    Results for orders placed or performed in visit on 69/48/54  Basic metabolic panel  Result Value Ref Range   Glucose 97 70 - 99 mg/dL   BUN 14 8 - 27 mg/dL   Creatinine, Ser 0.81 0.57 - 1.00 mg/dL   eGFR 77 >59 mL/min/1.73   BUN/Creatinine Ratio 17 12 - 28   Sodium 141 134 - 144 mmol/L   Potassium 4.6 3.5 - 5.2 mmol/L   Chloride 100 96 - 106 mmol/L   CO2 24 20 - 29 mmol/L   Calcium 10.2 8.7 - 10.3 mg/dL      Assessment & Plan:   Problem List Items Addressed This Visit       Other   Generalized anxiety disorder    Chronic, stable, has reduced use of Xanax -- using lowest dose possible for her.  Only taking Xanax 0.25 MG at night 5 days a week.  Have sent in refill 0.25 MG QHS #90 tablets with 0 refills dated 11/26/21.  Had discussion about BEERS criteria and risks with these.   Will continue to monitor, she is up  to date on contract and UDS.  Continue Lexapro.  She agrees to every 3 month visits, will return in 3 months.      Relevant Medications   ALPRAZolam (XANAX)  0.25 MG tablet (Start on 11/26/2021)   Long-term current use of benzodiazepine    Refer to anxiety plan of care.        Follow up plan: Return in about 3 months (around 02/06/2022) for MOOD.

## 2021-11-07 NOTE — Assessment & Plan Note (Signed)
Refer to anxiety plan of care. 

## 2021-11-07 NOTE — Assessment & Plan Note (Signed)
Chronic, stable, has reduced use of Xanax -- using lowest dose possible for her.  Only taking Xanax 0.25 MG at night 5 days a week.  Have sent in refill 0.25 MG QHS #90 tablets with 0 refills dated 11/26/21.  Had discussion about BEERS criteria and risks with these.   Will continue to monitor, she is up to date on contract and UDS.  Continue Lexapro.  She agrees to every 3 month visits, will return in 3 months.

## 2021-11-17 IMAGING — MG MM DIGITAL SCREENING BILAT W/ TOMO AND CAD
8 series · 9 of 24 positions shown · non-contrast
Comparison: Previous exam(s).

CLINICAL DATA: Screening.

EXAM:
DIGITAL SCREENING BILATERAL MAMMOGRAM WITH TOMOSYNTHESIS AND CAD
TECHNIQUE: Bilateral screening digital craniocaudal and mediolateral oblique
mammograms were obtained. Bilateral screening digital breast
tomosynthesis was performed. The images were evaluated with
computer-aided detection.

[R CC synth-2D]
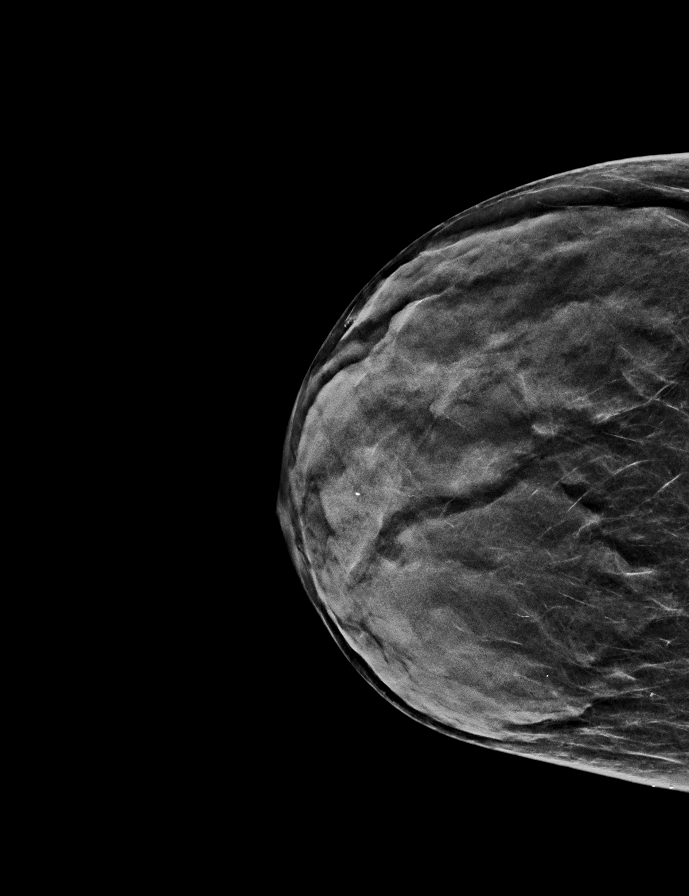

[L MLO synth-2D]
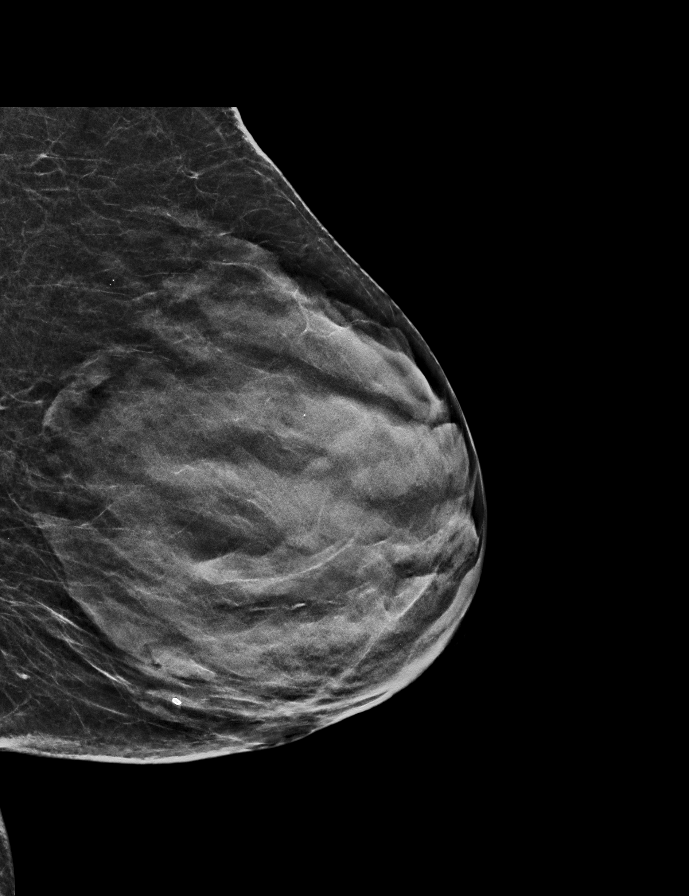

[L CC synth-2D]
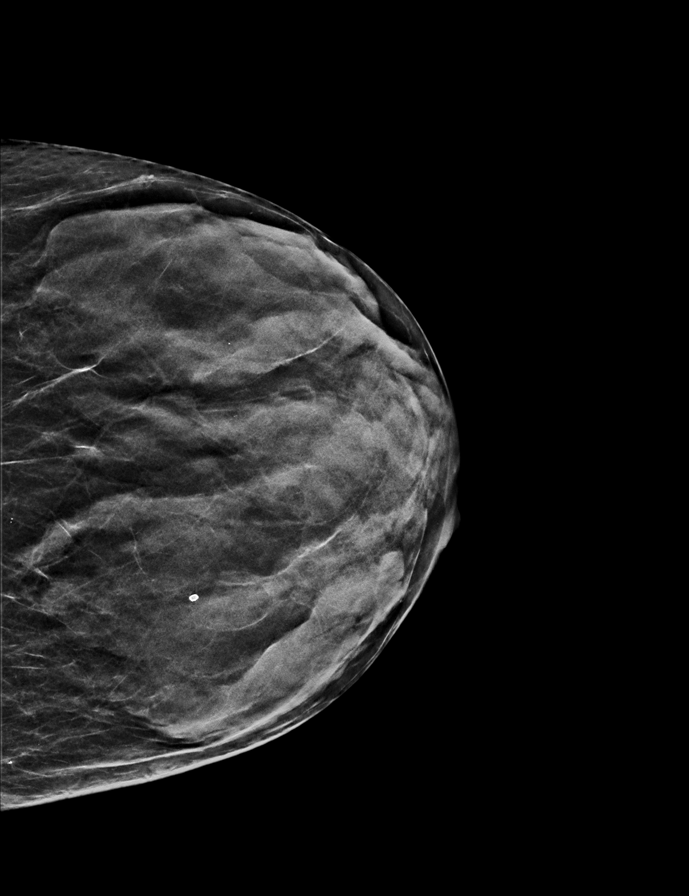

[R MLO synth-2D]
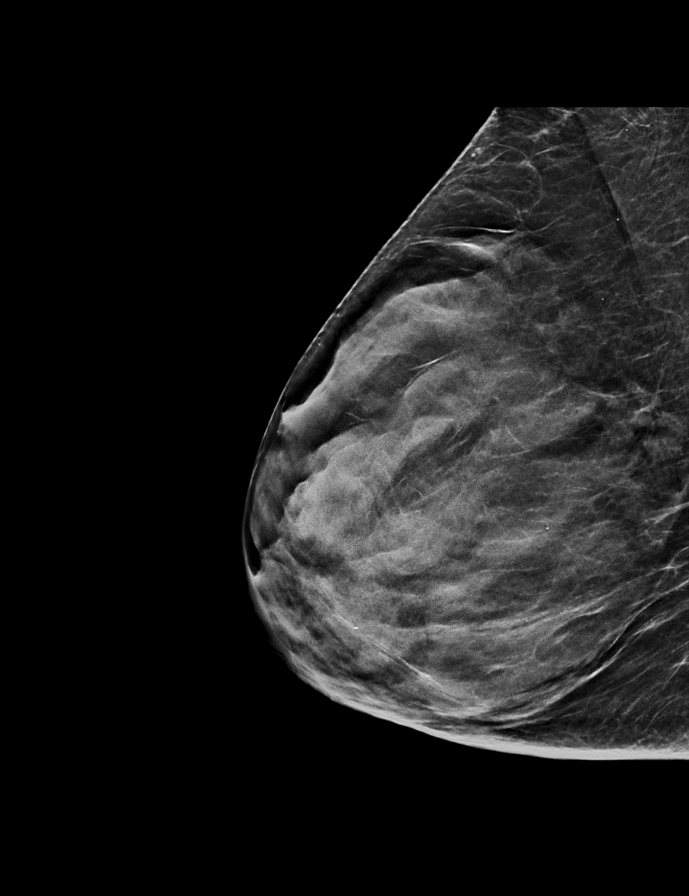

[R CC tomo · 2 of 41 frames shown]
[frame 14/41]
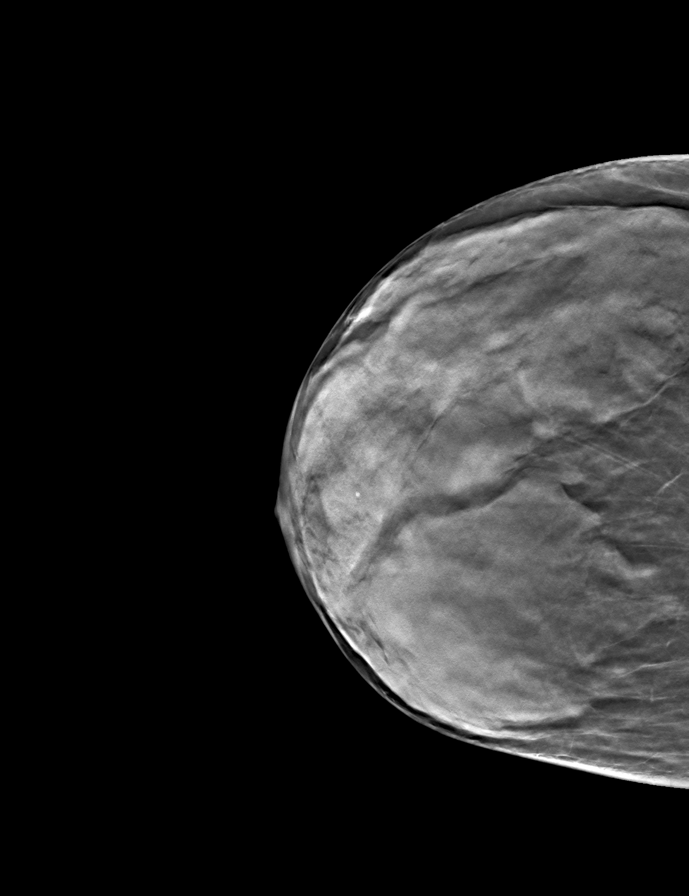
[frame 21/41]
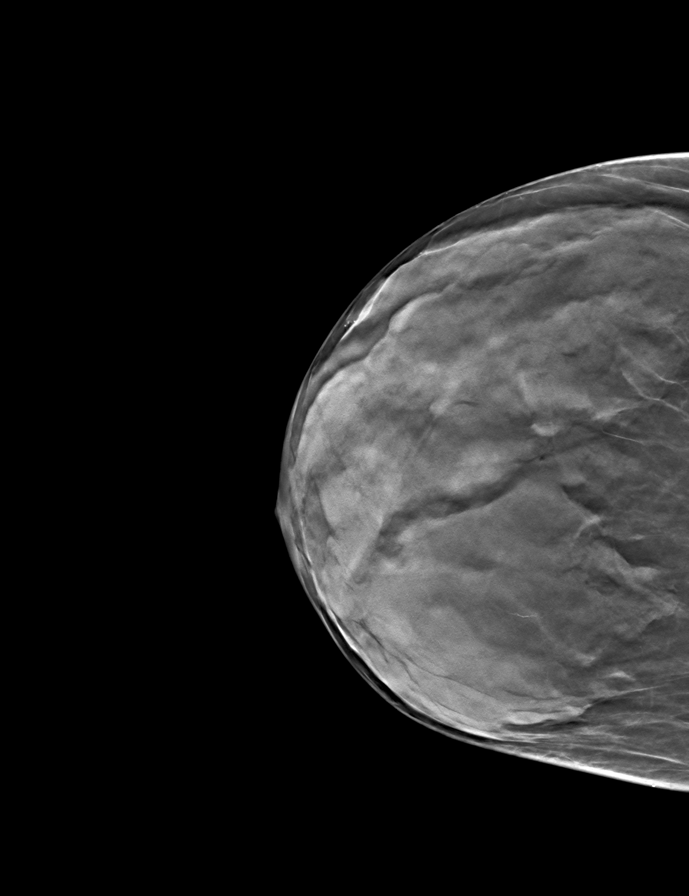

[L MLO tomo · tomo slice 20/39.0]
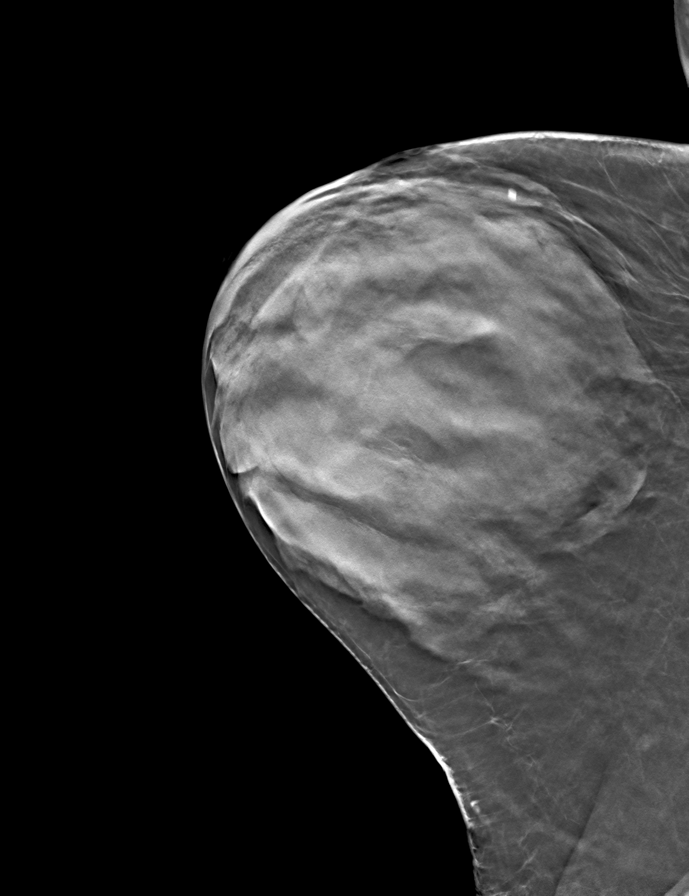

[R MLO tomo · tomo slice 21/42.0]
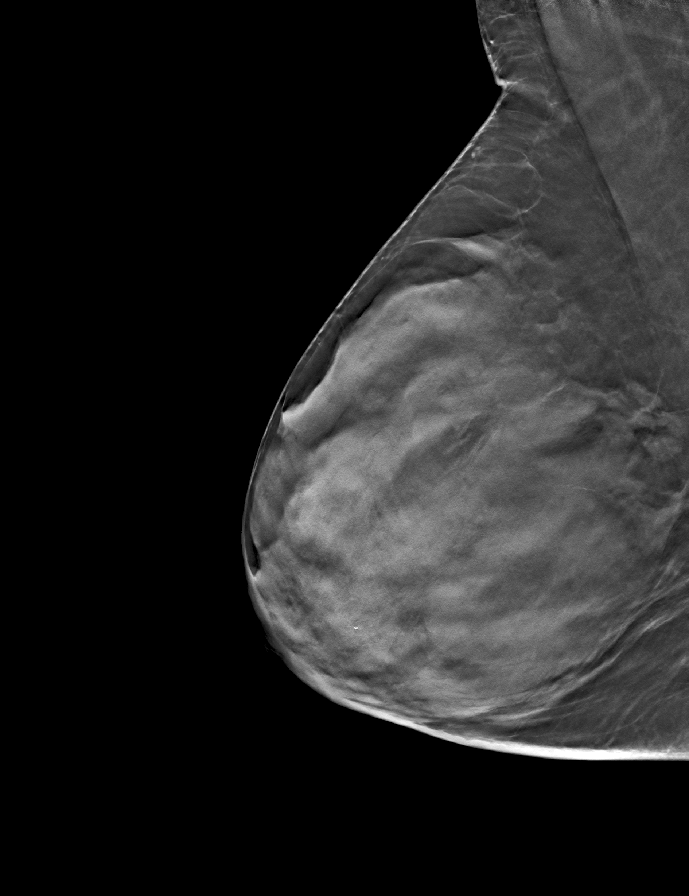

[L CC tomo · tomo slice 19/38.0]
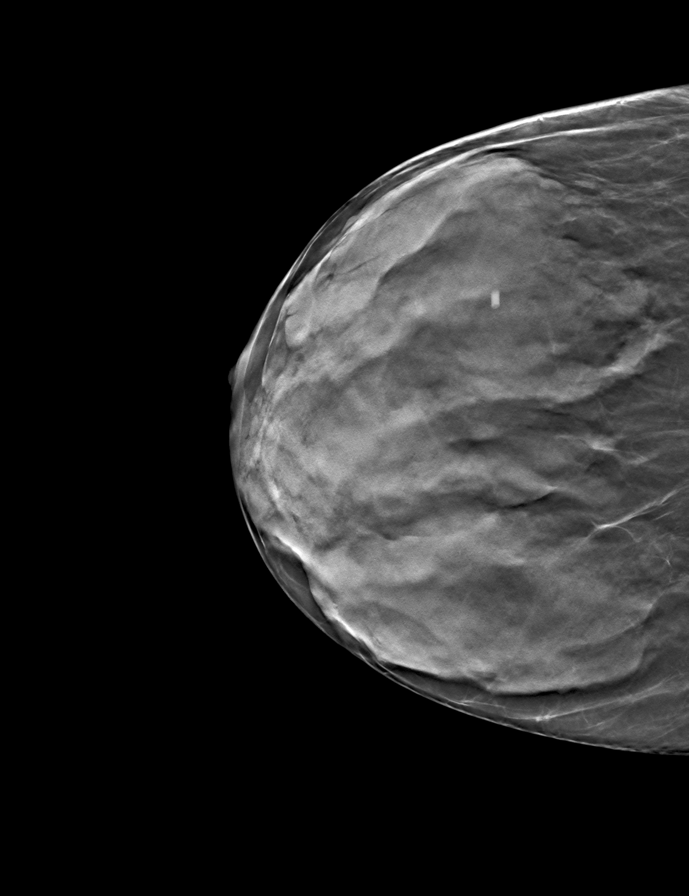

[9 of 24 positions shown; findings below may reference images not displayed]

ACR Breast Density Category d: The breast tissue is extremely dense,
which lowers the sensitivity of mammography
FINDINGS: There are no findings suspicious for malignancy. The images were
evaluated with computer-aided detection.
IMPRESSION: No mammographic evidence of malignancy. A result letter of this
screening mammogram will be mailed directly to the patient.

RECOMMENDATION:
Screening mammogram in one year. (Code:95-0-E9V)

BI-RADS CATEGORY  1: Negative.

## 2021-11-28 ENCOUNTER — Other Ambulatory Visit: Payer: Self-pay | Admitting: Nurse Practitioner

## 2021-11-28 NOTE — Telephone Encounter (Signed)
Requested Prescriptions  Pending Prescriptions Disp Refills  . escitalopram (LEXAPRO) 10 MG tablet [Pharmacy Med Name: ESCITALOPRAM 10 MG TABLET] 90 tablet 0    Sig: Take 1 tablet (10 mg total) by mouth daily.     Psychiatry:  Antidepressants - SSRI Passed - 11/28/2021  1:16 PM      Passed - Valid encounter within last 6 months    Recent Outpatient Visits          3 weeks ago Generalized anxiety disorder   [place], [place] T, NP   3 months ago Generalized anxiety disorder   [place], [place] T, NP   6 months ago Senile purpura ([place])   [place] Cannady, Barbaraann Faster, NP   9 months ago Generalized anxiety disorder   [place], Henrine Screws T, NP   1 year ago Senile purpura ([place])   [place], Barbaraann Faster, NP      Future Appointments            In 2 months Cannady, Barbaraann Faster, NP [facility]

## 2021-12-11 ENCOUNTER — Other Ambulatory Visit: Payer: Self-pay | Admitting: Nurse Practitioner

## 2021-12-11 DIAGNOSIS — Z1231 Encounter for screening mammogram for malignant neoplasm of breast: Secondary | ICD-10-CM

## 2021-12-26 ENCOUNTER — Other Ambulatory Visit: Payer: Self-pay | Admitting: Nurse Practitioner

## 2021-12-26 NOTE — Telephone Encounter (Signed)
Requested Prescriptions  Pending Prescriptions Disp Refills  . simvastatin (ZOCOR) 20 MG tablet [Pharmacy Med Name: SIMVASTATIN 20 MG TABLET] 90 tablet 0    Sig: Take 1 tablet (20 mg total) by mouth daily at 6 PM.     Cardiovascular:  Antilipid - Statins Failed - 12/26/2021  1:16 PM      Failed - Lipid Panel in normal range within the last 12 months    Cholesterol, Total  Date Value Ref Range Status  05/07/2021 185 100 - 199 mg/dL Final   LDL Chol Calc (NIH)  Date Value Ref Range Status  05/07/2021 83 0 - 99 mg/dL Final   HDL  Date Value Ref Range Status  05/07/2021 84 >39 mg/dL Final   Triglycerides  Date Value Ref Range Status  05/07/2021 100 0 - 149 mg/dL Final         Passed - Patient is not pregnant      Passed - Valid encounter within last 12 months    Recent Outpatient Visits          1 month ago Generalized anxiety disorder   [place], [place] T, NP   4 months ago Generalized anxiety disorder   [place] Cannady, [place] T, NP   7 months ago Senile purpura ([place])   [place], Henrine Screws T, NP   10 months ago Generalized anxiety disorder   [place], Henrine Screws T, NP   1 year ago Senile purpura ([place])   [place], Barbaraann Faster, NP      Future Appointments            In 1 month Cannady, Barbaraann Faster, NP [facility]

## 2022-01-02 ENCOUNTER — Encounter: Payer: Self-pay | Admitting: Nurse Practitioner

## 2022-02-06 ENCOUNTER — Ambulatory Visit: Payer: PPO | Admitting: Nurse Practitioner

## 2022-02-08 NOTE — Patient Instructions (Signed)

## 2022-02-11 ENCOUNTER — Ambulatory Visit
Admission: RE | Admit: 2022-02-11 | Discharge: 2022-02-11 | Disposition: A | Discharge status: Home / Self Care | Payer: PPO | Source: Ambulatory Visit | Attending: Nurse Practitioner | Admitting: Nurse Practitioner

## 2022-02-11 DIAGNOSIS — Z780 Asymptomatic menopausal state: Secondary | ICD-10-CM | POA: Diagnosis not present

## 2022-02-11 DIAGNOSIS — Z1231 Encounter for screening mammogram for malignant neoplasm of breast: Secondary | ICD-10-CM | POA: Diagnosis not present

## 2022-02-11 DIAGNOSIS — M8589 Other specified disorders of bone density and structure, multiple sites: Secondary | ICD-10-CM | POA: Diagnosis not present

## 2022-02-11 NOTE — Progress Notes (Signed)
Contacted via MyChart   Your bone density shows thinning bones (osteopenia) but not brittle (osteoporosis). We recommend Vitamin D supplementation of about 2,0000 IUs of over the counter Vitamin D3. In addition, we recommend a diet high in calcium with dairy and dark green leafy vegetables. We would like you to get plenty of weight bearing exercises with walking and resistance training such as light weights or resistance bands available with instructions at places such as [employer].  There is some progression of osteopenia this check, we will recheck in 5 years -- ensure you are taking your Vitamin D daily.

## 2022-02-12 NOTE — Progress Notes (Signed)
Contacted via MyChart   Normal mammogram, may repeat in one year:)

## 2022-02-13 ENCOUNTER — Ambulatory Visit (INDEPENDENT_AMBULATORY_CARE_PROVIDER_SITE_OTHER): Payer: PPO | Admitting: Nurse Practitioner

## 2022-02-13 ENCOUNTER — Encounter: Payer: Self-pay | Admitting: Nurse Practitioner

## 2022-02-13 VITALS — BP 136/74 | HR 66 | Temp 98.2°F | Ht 61.5 in | Wt 107.4 lb

## 2022-02-13 DIAGNOSIS — F411 Generalized anxiety disorder: Secondary | ICD-10-CM

## 2022-02-13 DIAGNOSIS — Z79899 Other long term (current) drug therapy: Secondary | ICD-10-CM

## 2022-02-13 DIAGNOSIS — H938X3 Other specified disorders of ear, bilateral: Secondary | ICD-10-CM

## 2022-02-13 MED ORDER — ALPRAZOLAM 0.25 MG PO TABS: 0.2500 mg | ORAL_TABLET | Freq: Every evening | ORAL | 0 refills | Status: DC | PRN

## 2022-02-13 NOTE — Assessment & Plan Note (Signed)
Chronic, stable.  Has reduced use of Xanax -- using lowest dose possible for her.  Only taking Xanax 0.25 MG at night 5 days a week.  Have sent in refill 0.25 MG QHS #90 tablets with 0 refills dated 02/24/22.  Had discussion about BEERS criteria and risks with these.   Will continue to monitor, she is up to date on contract and UDS.  Continue Lexapro.  She agrees to every 3 month visits, will return in 3 months.

## 2022-02-13 NOTE — Progress Notes (Signed)
.cfpb   BP 136/74   Pulse 66   Temp 98.2 F (36.8 C) (Oral)   Ht 5' 1.5" (1.562 m)   Wt 107 lb 6.4 oz (48.7 kg)   SpO2 96%   BMI 19.97 kg/m    Subjective:    Patient ID: Brandy Parker, female    DOB: 04/13/1950, 71 y.o.   MRN: [id number]  HPI: Brandy Parker is a 71 y.o. female  Chief Complaint  Patient presents with   Anxiety   Depression   EAR PRESSURE Her ears have been stopping up and would like looked at.  No pain or recent illness. Ongoing for 2 weeks.  No recent travel or swimming Duration: weeks Involved ear(s): bilateral Fever: no Otorrhea: no Upper respiratory infection symptoms: no Pruritus: no Hearing loss: no Water immersion no Using Q-tips: no Recurrent otitis media: no Status: fluctuating Treatments attempted: none   ANXIETY/STRESS Continues Xanax 0.25 MG PRN (5 nights a week) and Lexapro 10 MG daily.   Been on Xanax for a long while per her report, several years (2008).  Pt is aware of risks of benzo medication use to include increased sedation, respiratory suppression, falls, dependence and cardiovascular events. Pt would like to continue treatment as benefit determined to outweigh risk.  On PDMP review her last fill Xanax was 12/26/21 for 90 day supply. Previously got 90 day supply and would like to continue this.   Duration:stable Anxious mood: no  Excessive worrying: no Irritability: no  Sweating: no Nausea: no Palpitations:no Hyperventilation: no Panic attacks: no Agoraphobia: no  Obscessions/compulsions: no Depressed mood: no    02/13/2022    1:27 PM 11/07/2021    1:16 PM 08/06/2021    1:06 PM 05/29/2021   11:22 AM 05/07/2021    1:05 PM  Depression screen PHQ 2/9  Decreased Interest 0 0 0 0 0  Down, Depressed, Hopeless 0 0 0 0 0  PHQ - 2 Score 0 0 0 0 0  Altered sleeping 0 0 0 0 0  Tired, decreased energy 0 0 0 0 0  Change in appetite 0 0 0 0 0  Feeling bad or failure about yourself  0 0 0 0 0  Trouble concentrating 0 0 0 0 0   Moving slowly or fidgety/restless 0 0 0 0 0  Suicidal thoughts 0 0 0 0 0  PHQ-9 Score 0 0 0 0 0  Difficult doing work/chores Not difficult at all Not difficult at all Not difficult at all        02/13/2022    1:27 PM 11/07/2021    1:16 PM 08/06/2021    1:06 PM 05/07/2021    1:06 PM  GAD 7 : Generalized Anxiety Score  Nervous, Anxious, on Edge 0 0 0 0  Control/stop worrying 0 0 0 0  Worry too much - different things 0 0 0 0  Trouble relaxing 0 0 0 0  Restless 0 0 0 0  Easily annoyed or irritable 0 0 0 0  Afraid - awful might happen 0 0 0 0  Total GAD 7 Score 0 0 0 0  Anxiety Difficulty Not difficult at all Not difficult at all Not difficult at all Not difficult at all   Relevant past medical, surgical, family and social history reviewed and updated as indicated. Interim medical history since our last visit reviewed. Allergies and medications reviewed and updated.  Review of Systems  Constitutional:  Negative for activity change, appetite change, diaphoresis, fatigue and fever.  HENT:  Negative for ear discharge and ear pain.   Respiratory:  Negative for cough, chest tightness and shortness of breath.   Cardiovascular:  Negative for chest pain, palpitations and leg swelling.  Gastrointestinal: Negative.   Neurological: Negative.   Psychiatric/Behavioral:  Negative for decreased concentration, self-injury, sleep disturbance and suicidal ideas. The patient is not nervous/anxious.     Per HPI unless specifically indicated above     Objective:    BP 136/74   Pulse 66   Temp 98.2 F (36.8 C) (Oral)   Ht 5' 1.5" (1.562 m)   Wt 107 lb 6.4 oz (48.7 kg)   SpO2 96%   BMI 19.97 kg/m   Wt Readings from Last 3 Encounters:  02/13/22 107 lb 6.4 oz (48.7 kg)  11/07/21 108 lb (49 kg)  08/06/21 108 lb 6.4 oz (49.2 kg)    Physical Exam Vitals and nursing note reviewed.  Constitutional:      General: She is awake. She is not in acute distress.    Appearance: She is well-developed and  well-groomed. She is not ill-appearing.  HENT:     Head: Normocephalic.     Right Ear: Hearing, tympanic membrane, ear canal and external ear normal.     Left Ear: Hearing, tympanic membrane, ear canal and external ear normal.     Ears:     Comments: Slight bulge of TM bilaterally. Eyes:     General: Lids are normal.        Right eye: No discharge.        Left eye: No discharge.     Conjunctiva/sclera: Conjunctivae normal.     Pupils: Pupils are equal, round, and reactive to light.  Neck:     Vascular: No carotid bruit.  Cardiovascular:     Rate and Rhythm: Normal rate and regular rhythm.     Heart sounds: Normal heart sounds. No murmur heard.    No gallop.  Pulmonary:     Effort: Pulmonary effort is normal. No accessory muscle usage or respiratory distress.     Breath sounds: Normal breath sounds.  Abdominal:     General: Bowel sounds are normal.     Palpations: Abdomen is soft.  Musculoskeletal:     Cervical back: Normal range of motion and neck supple.     Right lower leg: No edema.     Left lower leg: No edema.  Skin:    General: Skin is warm and dry.  Neurological:     Mental Status: She is alert and oriented to person, place, and time.  Psychiatric:        Attention and Perception: Attention normal.        Mood and Affect: Mood normal.        Speech: Speech normal.        Behavior: Behavior normal. Behavior is cooperative.        Thought Content: Thought content normal.    Results for orders placed or performed in visit on 60/45/40  Basic metabolic panel  Result Value Ref Range   Glucose 97 70 - 99 mg/dL   BUN 14 8 - 27 mg/dL   Creatinine, Ser 0.81 0.57 - 1.00 mg/dL   eGFR 77 >59 mL/min/1.73   BUN/Creatinine Ratio 17 12 - 28   Sodium 141 134 - 144 mmol/L   Potassium 4.6 3.5 - 5.2 mmol/L   Chloride 100 96 - 106 mmol/L   CO2 24 20 - 29 mmol/L   Calcium 10.2 8.7 -  10.3 mg/dL      Assessment & Plan:   Problem List Items Addressed This Visit       Other    Generalized anxiety disorder - Primary    Chronic, stable.  Has reduced use of Xanax -- using lowest dose possible for her.  Only taking Xanax 0.25 MG at night 5 days a week.  Have sent in refill 0.25 MG QHS #90 tablets with 0 refills dated 02/24/22.  Had discussion about BEERS criteria and risks with these.   Will continue to monitor, she is up to date on contract and UDS.  Continue Lexapro.  She agrees to every 3 month visits, will return in 3 months.      Relevant Medications   ALPRAZolam (XANAX) 0.25 MG tablet (Start on 02/24/2022)   Long-term current use of benzodiazepine    Refer to anxiety plan of care.      Other Visit Diagnoses     Pressure sensation in both ears       Suspect allergy related -- recommend she start Claritin 10 MG daily for 7-14 days and if no improvement alert provider immediately.        Follow up plan: Return in about 3 months (around 05/15/2022) for Annual physical.

## 2022-02-13 NOTE — Assessment & Plan Note (Signed)
Refer to anxiety plan of care. 

## 2022-03-26 ENCOUNTER — Other Ambulatory Visit: Payer: Self-pay | Admitting: Nurse Practitioner

## 2022-03-26 NOTE — Telephone Encounter (Signed)
Labs due 05/07/21. Future visit in 1 month. Requested Prescriptions  Pending Prescriptions Disp Refills   simvastatin (ZOCOR) 20 MG tablet [Pharmacy Med Name: SIMVASTATIN 20 MG TABLET] 90 tablet 0    Sig: Take 1 tablet (20 mg total) by mouth daily at 6 PM.     Cardiovascular:  Antilipid - Statins Failed - 03/26/2022  1:18 PM      Failed - Lipid Panel in normal range within the last 12 months    Cholesterol, Total  Date Value Ref Range Status  05/07/2021 185 100 - 199 mg/dL Final   LDL Chol Calc (NIH)  Date Value Ref Range Status  05/07/2021 83 0 - 99 mg/dL Final   HDL  Date Value Ref Range Status  05/07/2021 84 >39 mg/dL Final   Triglycerides  Date Value Ref Range Status  05/07/2021 100 0 - 149 mg/dL Final         Passed - Patient is not pregnant      Passed - Valid encounter within last 12 months    Recent Outpatient Visits           1 month ago Generalized anxiety disorder   [place], [place] T, NP   4 months ago Generalized anxiety disorder   [place], [place] T, NP   7 months ago Generalized anxiety disorder   [place] Cannady, [place]ol[place] T, NP   10 months ago Senile purpura ([place])   [place], Barbaraann Faster, NP   1 year ago Generalized anxiety disorder   [place], Barbaraann Faster, NP       Future Appointments             In 1 month Cannady, Barbaraann Faster, NP [facility]

## 2022-05-07 ENCOUNTER — Telehealth: Payer: Self-pay | Admitting: Nurse Practitioner

## 2022-05-07 NOTE — Telephone Encounter (Signed)
Contacted RONNISHA TATGE to schedule their annual wellness visit. Appointment made for 06/01/2022.  [place]rol [place]; Care Guide [facility] Direct Dial: [phone]

## 2022-05-14 DIAGNOSIS — H5213 Myopia, bilateral: Secondary | ICD-10-CM | POA: Diagnosis not present

## 2022-05-14 DIAGNOSIS — H35033 Hypertensive retinopathy, bilateral: Secondary | ICD-10-CM | POA: Diagnosis not present

## 2022-05-14 DIAGNOSIS — H1045 Other chronic allergic conjunctivitis: Secondary | ICD-10-CM | POA: Diagnosis not present

## 2022-05-14 DIAGNOSIS — H2513 Age-related nuclear cataract, bilateral: Secondary | ICD-10-CM | POA: Diagnosis not present

## 2022-05-14 DIAGNOSIS — H40013 Open angle with borderline findings, low risk, bilateral: Secondary | ICD-10-CM | POA: Diagnosis not present

## 2022-05-15 ENCOUNTER — Ambulatory Visit (INDEPENDENT_AMBULATORY_CARE_PROVIDER_SITE_OTHER): Payer: PPO | Admitting: Nurse Practitioner

## 2022-05-15 ENCOUNTER — Encounter: Payer: Self-pay | Admitting: Nurse Practitioner

## 2022-05-15 VITALS — BP 128/80 | HR 76 | Temp 98.4°F | Resp 18 | Ht 61.5 in | Wt 107.9 lb

## 2022-05-15 DIAGNOSIS — Z79899 Other long term (current) drug therapy: Secondary | ICD-10-CM

## 2022-05-15 DIAGNOSIS — D692 Other nonthrombocytopenic purpura: Secondary | ICD-10-CM

## 2022-05-15 DIAGNOSIS — F411 Generalized anxiety disorder: Secondary | ICD-10-CM | POA: Diagnosis not present

## 2022-05-15 DIAGNOSIS — E782 Mixed hyperlipidemia: Secondary | ICD-10-CM

## 2022-05-15 DIAGNOSIS — H401131 Primary open-angle glaucoma, bilateral, mild stage: Secondary | ICD-10-CM

## 2022-05-15 DIAGNOSIS — Z0000 Encounter for general adult medical examination without abnormal findings: Secondary | ICD-10-CM

## 2022-05-15 DIAGNOSIS — M8588 Other specified disorders of bone density and structure, other site: Secondary | ICD-10-CM

## 2022-05-15 DIAGNOSIS — F17210 Nicotine dependence, cigarettes, uncomplicated: Secondary | ICD-10-CM

## 2022-05-15 MED ORDER — ESCITALOPRAM OXALATE 10 MG PO TABS: 10.0000 mg | ORAL_TABLET | Freq: Every day | ORAL | 4 refills | Status: DC

## 2022-05-15 MED ORDER — ALPRAZOLAM 0.25 MG PO TABS: 0.2500 mg | ORAL_TABLET | Freq: Every evening | ORAL | 0 refills | Status: DC | PRN

## 2022-05-15 MED ORDER — SIMVASTATIN 20 MG PO TABS: 20.0000 mg | ORAL_TABLET | Freq: Every day | ORAL | 4 refills | Status: DC

## 2022-05-15 NOTE — Assessment & Plan Note (Signed)
Chronic, ongoing.  Continue current medication regimen and adjust as needed.  Plan on lipid panel today.  Refills sent in.

## 2022-05-15 NOTE — Assessment & Plan Note (Signed)
I have recommended complete cessation of tobacco use. I have discussed various options available for assistance with tobacco cessation including over the counter methods (Nicotine gum, patch and lozenges). We also discussed prescription options (Chantix, Nicotine Inhaler / Nasal Spray). The patient is not interested in pursuing any prescription tobacco cessation options at this time.  Does not want to obtain lung cancer screening.

## 2022-05-15 NOTE — Progress Notes (Signed)
BP 128/80 (BP Location: Left Arm, Patient Position: Sitting, Cuff Size: Normal) Comment: had two cups of coffee before coming here  Pulse 76   Temp 98.4 F (36.9 C) (Oral)   Resp 18   Ht 5' 1.5" (1.562 m)   Wt 107 lb 14.4 oz (48.9 kg)   SpO2 96%   BMI 20.06 kg/m    Subjective:    Patient ID: Brandy Parker, female    DOB: 1950-11-12, 72 y.o.   MRN: [id number]  HPI: Brandy Parker is a 72 y.o. female presenting on 2022-05-17 for comprehensive medical examination. Current medical complaints include:none  She currently lives with: self Menopausal Symptoms: no  OSTEOPENIA Vitamin D3 supplement at home.  Last DEXA 02/11/22 with T-score -2.2 (osteopenia). Adequate calcium & vitamin D: no Weight bearing exercises: yes   ANXIETY/STRESS Taking Xanax 0.25 MG PRN and Lexapro 10 MG daily.  Xanax she takes 5 nights a week to help rest.   Taken Xanax for many years, since 2008. Pt is aware of risks of benzo medication use to include increased sedation, respiratory suppression, falls, dependence and cardiovascular events. Pt would like to continue treatment as benefit determined to outweigh risk.  On PDMP review her last fill Xanax was 12/26/21 for 90 day supply. Duration:stable Anxious mood: no  Excessive worrying: no Irritability: no  Sweating: no Nausea: no Palpitations:no Hyperventilation: no Panic attacks: no Agoraphobia: no  Obscessions/compulsions: no Depressed mood: no    05-17-2022    1:15 PM 02/13/2022    1:27 PM 11/07/2021    1:16 PM 08/06/2021    1:06 PM 05/29/2021   11:22 AM  Depression screen PHQ 2/9  Decreased Interest 0 0 0 0 0  Down, Depressed, Hopeless 0 0 0 0 0  PHQ - 2 Score 0 0 0 0 0  Altered sleeping 0 0 0 0 0  Tired, decreased energy 0 0 0 0 0  Change in appetite 0 0 0 0 0  Feeling bad or failure about yourself  0 0 0 0 0  Trouble concentrating 0 0 0 0 0  Moving slowly or fidgety/restless 0 0 0 0 0  Suicidal thoughts 0 0 0 0 0  PHQ-9 Score 0 0 0 0 0   Difficult doing work/chores Not difficult at all Not difficult at all Not difficult at all Not difficult at all   Anhedonia: no Weight changes: no Insomnia: no  Hypersomnia: no Fatigue/loss of energy: no Feelings of worthlessness: no Feelings of guilt: no Impaired concentration/indecisiveness: no Suicidal ideations: no  Crying spells: no Recent Stressors/Life Changes: no   Relationship problems: no   Family stress: no     Financial stress: no    Job stress: no    Recent death/loss: no     17-May-2022    1:15 PM 02/13/2022    1:27 PM 11/07/2021    1:16 PM 08/06/2021    1:06 PM  GAD 7 : Generalized Anxiety Score  Nervous, Anxious, on Edge 0 0 0 0  Control/stop worrying 0 0 0 0  Worry too much - different things 0 0 0 0  Trouble relaxing 0 0 0 0  Restless 0 0 0 0  Easily annoyed or irritable 0 0 0 0  Afraid - awful might happen 0 0 0 0  Total GAD 7 Score 0 0 0 0  Anxiety Difficulty Not difficult at all Not difficult at all Not difficult at all Not difficult at all  HYPERLIPIDEMIA Taking Simvastatin 20 MG daily.  Current smoker, smokes about 5-6 cigarettes a day.  Started smoking in 20's.  Never has been a 1 PPD smoker.  Not interested in quitting.  She is not interested in lung cancer screening. Hyperlipidemia status: excellent compliance Satisfied with current treatment?  yes Side effects:  no Medication compliance: excellent compliance Past cholesterol meds: none Supplements: none Aspirin:  no The 10-year ASCVD risk score (Arnett DK, et al., 2019) is: 18.6%   Values used to calculate the score:     Age: 28 years     Sex: Female     Is Non-Hispanic African American: No     Diabetic: No     Tobacco smoker: Yes     Systolic Blood Pressure: 0000000 mmHg     Is BP treated: No     HDL Cholesterol: 84 mg/dL     Total Cholesterol: 185 mg/dL Chest pain:  no Coronary artery disease:  no Family history CAD:  no Family history early CAD:  no       05/29/2021   11:19 AM  08/06/2021    1:07 PM 11/07/2021    1:20 PM 02/13/2022    1:26 PM 05/15/2022    1:14 PM  [place] in the past year? 0 0 0 0 0  Was there an injury with Fall? 0 1 0 0 0  Fall Risk Category Calculator 0 1 0 0 0  Fall Risk Category (Retired) Low Low Low Low   (RETIRED) Patient Fall Risk Level Low fall risk Low fall risk     Patient at Risk for Falls Due to  History of fall(s) No Fall Risks No Fall Risks No Fall Risks  Fall risk Follow up Falls evaluation completed;Falls prevention discussed;Education provided Falls evaluation completed Falls evaluation completed Falls evaluation completed Falls evaluation completed    Functional Status Survey: Is the patient deaf or have difficulty hearing?: No Does the patient have difficulty seeing, even when wearing glasses/contacts?: No Does the patient have difficulty concentrating, remembering, or making decisions?: No Does the patient have difficulty walking or climbing stairs?: No Does the patient have difficulty dressing or bathing?: No Does the patient have difficulty doing errands alone such as visiting a doctor's office or shopping?: No    Past Medical History:  Past Medical History:  Diagnosis Date   Anxiety    Hyperlipemia     Surgical History:  Past Surgical History:  Procedure Laterality Date   ORIF ANKLE FRACTURE Left 07/20/2016   Procedure: OPEN REDUCTION INTERNAL FIXATION (ORIF) ANKLE FRACTURE;  Surgeon: Lovell Sheehan, MD;  Location: [facility] ORS;  Service: Orthopedics;  Laterality: Left;    Medications:  Current Outpatient Medications on File Prior to Visit  Medication Sig   cholecalciferol (VITAMIN D3) 25 MCG (1000 UNIT) tablet Take 1,000 Units by mouth daily.   Multiple Vitamin (MULTIVITAMIN) tablet Take 1 tablet by mouth daily.   No current facility-administered medications on file prior to visit.    Allergies:  Allergies  Allergen Reactions   Sulfa Antibiotics Other (See Comments)    Per patient, unknown reaction     Social History:  Social History   Socioeconomic History   Marital status: Single    Spouse name: Not on file   Number of children: Not on file   Years of education: Not on file   Highest education level: Not on file  Occupational History   Occupation: fulltime     Employer: [employer]  Tobacco Use   Smoking status: Every Day    Packs/day: 0.25    Types: Cigarettes   Smokeless tobacco: Never  Vaping Use   Vaping Use: Never used  Substance and Sexual Activity   Alcohol use: No   Drug use: No   Sexual activity: Not Currently  Other Topics Concern   Not on file  Social History Narrative   Not on file   Social Determinants of Health   Financial Resource Strain: Low Risk  (05/15/2022)   Overall Financial Resource Strain (CARDIA)    Difficulty of Paying Living Expenses: Not hard at all  Food Insecurity: No Food Insecurity (05/15/2022)   Hunger Vital Sign    Worried About Running Out of Food in the Last Year: Never true    Ran Out of Food in the Last Year: Never true  Transportation Needs: No Transportation Needs (05/15/2022)   PRAPARE - Hydrologist (Medical): No    Lack of Transportation (Non-Medical): No  Physical Activity: Insufficiently Active (05/15/2022)   Exercise Vital Sign    Days of Exercise per Week: 3 days    Minutes of Exercise per Session: 20 min  Stress: No Stress Concern Present (05/15/2022)   [place]    Feeling of Stress : Not at all  Social Connections: Socially Isolated (05/15/2022)   Social Connection and Isolation Panel [NHANES]    Frequency of Communication with Friends and Family: Three times a week    Frequency of Social Gatherings with Friends and Family: Three times a week    Attends Religious Services: Never    Active Member of Clubs or Organizations: No    Attends Archivist Meetings: Never    Marital Status: Never married  Intimate Partner  Violence: Not At Risk (05/15/2022)   Humiliation, Afraid, Rape, and Kick questionnaire    Fear of Current or Ex-Partner: No    Emotionally Abused: No    Physically Abused: No    Sexually Abused: No   Social History   Tobacco Use  Smoking Status Every Day   Packs/day: 0.25   Types: Cigarettes  Smokeless Tobacco Never   Social History   Substance and Sexual Activity  Alcohol Use No    Family History:  Family History  Problem Relation Age of Onset   Hypertension Mother    Heart failure Mother    Hypertension Father    Hyperlipidemia Father    Cancer Father        bladder   Heart failure Father    Cancer Sister        bladder   Breast cancer Neg Hx     Past medical history, surgical history, medications, allergies, family history and social history reviewed with patient today and changes made to appropriate areas of the chart.   ROS  All other ROS negative except what is listed above and in the HPI.      Objective:    BP 128/80 (BP Location: Left Arm, Patient Position: Sitting, Cuff Size: Normal) Comment: had two cups of coffee before coming here  Pulse 76   Temp 98.4 F (36.9 C) (Oral)   Resp 18   Ht 5' 1.5" (1.562 m)   Wt 107 lb 14.4 oz (48.9 kg)   SpO2 96%   BMI 20.06 kg/m   Wt Readings from Last 3 Encounters:  05/15/22 107 lb 14.4 oz (48.9 kg)  02/13/22 107 lb 6.4 oz (  48.7 kg)  11/07/21 108 lb (49 kg)    Physical Exam Vitals and nursing note reviewed. Exam conducted with a chaperone present.  Constitutional:      General: She is awake. She is not in acute distress.    Appearance: She is well-developed and well-groomed. She is not ill-appearing or toxic-appearing.  HENT:     Head: Normocephalic and atraumatic.     Right Ear: Hearing, tympanic membrane, ear canal and external ear normal. No drainage.     Left Ear: Hearing, tympanic membrane, ear canal and external ear normal. No drainage.     Nose: Nose normal.     Right Sinus: No maxillary sinus  tenderness or frontal sinus tenderness.     Left Sinus: No maxillary sinus tenderness or frontal sinus tenderness.     Mouth/Throat:     Mouth: Mucous membranes are moist.     Pharynx: Oropharynx is clear. Uvula midline. No pharyngeal swelling, oropharyngeal exudate or posterior oropharyngeal erythema.  Eyes:     General: Lids are normal.        Right eye: No discharge.        Left eye: No discharge.     Extraocular Movements: Extraocular movements intact.     Conjunctiva/sclera: Conjunctivae normal.     Pupils: Pupils are equal, round, and reactive to light.     Visual Fields: Right eye visual fields normal and left eye visual fields normal.  Neck:     Thyroid: No thyromegaly.     Vascular: No carotid bruit.     Trachea: Trachea normal.  Cardiovascular:     Rate and Rhythm: Normal rate and regular rhythm.     Heart sounds: Normal heart sounds. No murmur heard.    No gallop.  Pulmonary:     Effort: Pulmonary effort is normal. No accessory muscle usage or respiratory distress.     Breath sounds: Normal breath sounds.  Chest:  Breasts:    Right: Normal.     Left: Normal.  Abdominal:     General: Bowel sounds are normal.     Palpations: Abdomen is soft. There is no hepatomegaly or splenomegaly.     Tenderness: There is no abdominal tenderness.  Musculoskeletal:        General: Normal range of motion.     Cervical back: Normal range of motion and neck supple.     Right lower leg: No edema.     Left lower leg: No edema.  Lymphadenopathy:     Head:     Right side of head: No submental, submandibular, tonsillar, preauricular or posterior auricular adenopathy.     Left side of head: No submental, submandibular, tonsillar, preauricular or posterior auricular adenopathy.     Cervical: No cervical adenopathy.     Upper Body:     Right upper body: No supraclavicular, axillary or pectoral adenopathy.     Left upper body: No supraclavicular, axillary or pectoral adenopathy.  Skin:     General: Skin is warm and dry.     Capillary Refill: Capillary refill takes less than 2 seconds.     Findings: No rash.     Comments: Scattered small bruises to upper extremities.  Neurological:     Mental Status: She is alert and oriented to person, place, and time.     Gait: Gait is intact.     Deep Tendon Reflexes: Reflexes are normal and symmetric.     Reflex Scores:      Brachioradialis reflexes are  2+ on the right side and 2+ on the left side.      Patellar reflexes are 2+ on the right side and 2+ on the left side. Psychiatric:        Attention and Perception: Attention normal.        Mood and Affect: Mood normal.        Speech: Speech normal.        Behavior: Behavior normal. Behavior is cooperative.        Thought Content: Thought content normal.        Judgment: Judgment normal.    Results for orders placed or performed in visit on [id number]  Basic metabolic panel  Result Value Ref Range   Glucose 97 70 - 99 mg/dL   BUN 14 8 - 27 mg/dL   Creatinine, Ser 0.81 0.57 - 1.00 mg/dL   eGFR 77 >59 mL/min/1.73   BUN/Creatinine Ratio 17 12 - 28   Sodium 141 134 - 144 mmol/L   Potassium 4.6 3.5 - 5.2 mmol/L   Chloride 100 96 - 106 mmol/L   CO2 24 20 - 29 mmol/L   Calcium 10.2 8.7 - 10.3 mg/dL      Assessment & Plan:   Problem List Items Addressed This Visit       Cardiovascular and Mediastinum   Senile purpura ([place]) - Primary    Noted on exam, upper extremities.  Recommend monitor skin closely for breakdown and alert provider if present.  Gentle skin care at home.  CBC on labs today.      Relevant Medications   simvastatin (ZOCOR) 20 MG tablet   Other Relevant Orders   CBC with Differential/Platelet     Musculoskeletal and Integument   Osteopenia of lumbar spine    Noted on DEXA 2018, continue Vitamin D and check level today.  DEXA repeat in December 2025.      Relevant Orders   VITAMIN D 25 Hydroxy (Vit-D Deficiency, Fractures)   TSH     Other   Generalized  anxiety disorder    Chronic, stable.  Has reduced use of Xanax -- using lowest dose possible for her.  Only taking Xanax 0.25 MG at night 5 days a week.  Have sent in refill 0.25 MG QHS #90 tablets with 0 refills.  Had discussion about BEERS criteria and risks with these.   Will continue to monitor, she is up to date on contract and UDS updated today.  Continue Lexapro.  She agrees to every 3 month visits, will return in 3 months.      Relevant Medications   escitalopram (LEXAPRO) 10 MG tablet   ALPRAZolam (XANAX) 0.25 MG tablet   Other Relevant Orders   [id number] 11+Oxyco+Alc+Crt-Bund   Hyperlipidemia    Chronic, ongoing.  Continue current medication regimen and adjust as needed.  Plan on lipid panel today.  Refills sent in.      Relevant Medications   simvastatin (ZOCOR) 20 MG tablet   Other Relevant Orders   Comprehensive metabolic panel   Lipid Panel w/o Chol/HDL Ratio   Long-term current use of benzodiazepine    Refer to anxiety plan of care.      Relevant Orders   [id number] 11+Oxyco+Alc+Crt-Bund   Nicotine dependence, cigarettes, uncomplicated    I have recommended complete cessation of tobacco use. I have discussed various options available for assistance with tobacco cessation including over the counter methods (Nicotine gum, patch and lozenges). We also discussed prescription options (Chantix, Nicotine Inhaler / Nasal  Spray). The patient is not interested in pursuing any prescription tobacco cessation options at this time.  Does not want to obtain lung cancer screening.       Other Visit Diagnoses     Encounter for annual physical exam       Annual physical today with labs and health maintenance reviewed, discussed with patient.        Follow up plan: Return in about 3 months (around 08/15/2022) for Anxiety -- medication refill.   LABORATORY TESTING:  - Pap smear: not applicable  IMMUNIZATIONS:   - Tdap: Tetanus vaccination status reviewed: last tetanus booster within  10 years. - Influenza: Refused -- will hold off until new year - Pneumovax: Up to date - Prevnar: Up to date - COVID: Up to date - HPV: Not applicable - Shingrix vaccine: Refused  SCREENING: -Mammogram: Up to date  - Colonoscopy: Wishes to think about this -- had one years ago - Bone Density: Up to date -- due next 02/12/2027 -Hearing Test: Up to date  -Spirometry: Not applicable   PATIENT COUNSELING:    Advised to avoid cigarette smoking.    Diet: Encouraged to adjust caloric intake to maintain  or achieve ideal body weight, to reduce intake of dietary saturated fat and total fat, to limit sodium intake by avoiding high sodium foods and not adding table salt, and to maintain adequate dietary potassium and calcium preferably from fresh fruits, vegetables, and low-fat dairy products.    Stressed the importance of regular exercise  Injury prevention: Discussed safety belts, safety helmets, smoke detector, smoking near bedding or upholstery.   Dental health: Discussed importance of regular tooth brushing, flossing, and dental visits.    NEXT PREVENTATIVE PHYSICAL DUE IN 1 YEAR. Return in about 3 months (around 08/15/2022) for Anxiety -- medication refill.

## 2022-05-15 NOTE — Assessment & Plan Note (Signed)
Refer to anxiety plan of care. 

## 2022-05-15 NOTE — Assessment & Plan Note (Signed)
Noted on DEXA 2018, continue Vitamin D and check level today.  DEXA repeat in December 2025.

## 2022-05-15 NOTE — Assessment & Plan Note (Signed)
Chronic, stable.  Has reduced use of Xanax -- using lowest dose possible for her.  Only taking Xanax 0.25 MG at night 5 days a week.  Have sent in refill 0.25 MG QHS #90 tablets with 0 refills.  Had discussion about BEERS criteria and risks with these.   Will continue to monitor, she is up to date on contract and UDS updated today.  Continue Lexapro.  She agrees to every 3 month visits, will return in 3 months.

## 2022-05-15 NOTE — Patient Instructions (Addendum)
Depression and Anxiety You will learn about mood disorders, how these conditions can occur in people with heart disease, and why it is important to treat these conditions. To view the content, go to this web address: [email or web address]  This video will expire on: 02/19/2024. If you need access to this video following this date, please reach out to the healthcare provider who assigned it to you. This information is not intended to replace advice given to you by your health care provider. Make sure you discuss any questions you have with your health care provider. Elsevier Patient Education  [place].  Colonoscopy, Adult A colonoscopy is an exam to look at the large intestine. It is done using a long, thin, flexible tube that has a camera on the end. This exam is done to check for problems, such as: An abnormal growth of cells or tissue (tumor). Abnormal growths within the lining of your intestine (polyps). Irritation and swelling (inflammation). Bleeding. Tell your doctor about: Any allergies you have. All medicines you are taking. Tell him or her about vitamins, herbs, eye drops, creams, and over-the-counter medicines. Any problems you or family members have had with anesthetic medicines. Any bleeding problems you have. Any surgeries you have had. Any medical conditions you have. Any problems you have had with pooping (having bowel movements). Whether you are pregnant or may be pregnant. What are the risks? Generally, this is a safe procedure. However, problems may occur, such as: Bleeding. Damage to your intestine. Allergic reactions to medicines given during the procedure. Infection. This is rare. What happens before the procedure? Eating and drinking Follow instructions from your doctor about eating and drinking. These may include: A few days before the procedure: Follow a low-fiber diet. Avoid these foods: Nuts. Seeds. Dried fruit. Raw  fruits. Vegetables. 1-3 days before the procedure: Eat only gelatin dessert or ice pops. Drink only clear liquids, such as: Water. Clear broth or bouillon. Black coffee or tea. Clear juice. Clear soft drinks or sports drinks. Avoid liquids that have red or purple dye. On the day of the procedure: Do not eat solid foods. You may continue to drink clear liquids until up to 2 hours before the procedure. Do not eat or drink anything starting 2 hours before the procedure or as told by your doctor. Bowel prep If you were prescribed a bowel prep to take by mouth (orally) to clean out your colon: Take it as told by your doctor. Starting the day before your procedure, you will need to drink a lot of liquid medicine. The liquid will cause you to poop until your poop is almost clear or light green. If your skin or butt gets irritated from diarrhea, you may: Wipe the area with wipes that have medicine in them, such as adult wet wipes with aloe and vitamin E. Put something on your skin that soothes the area, such as petroleum jelly. If you vomit while drinking the bowel prep: Take a break for up to 60 minutes. Begin the bowel prep again. Call your doctor if you keep vomiting and you cannot take the bowel prep without vomiting. To clean out your colon, you may also be given: Laxative medicines. These help you poop. Instructions for using a liquid medicine (enema) injected into your butt. Medicines Ask your doctor about changing or stopping: Your normal medicines. Vitamins, herbs, and supplements. Over-the-counter medicines. Do not take aspirin or ibuprofen unless you are told to. General instructions Ask your doctor what steps will be taken  to prevent the spread of germs. These may include washing skin with a soap that kills germs. If you will be going home right after the procedure, plan to have a responsible adult: Take you home from the hospital or clinic. You will not be allowed to  drive. Care for you for the time you are told. What happens during the procedure?  An IV tube will be put into one of your veins. You will be given a medicine to make you fall asleep (general anesthetic). You will lie on your side with your knees bent. Oil or gel will be put on the tube. Then the tube will be: Put into the opening of the butt (anus). Gently put into your large intestine. Air will be sent into your colon to keep it open. You may feel some pressure or cramping. The camera will be used to take photos that will appear on a screen. A small tissue sample may be removed for testing (biopsy). If small growths are found, your doctor may remove them and have them checked for cancer. The tube will be slowly removed. The procedure may vary among doctors and hospitals. What happens after the procedure? You will be monitored until you leave the hospital or clinic. This includes checking your blood pressure, heart rate, breathing rate, and blood oxygen level. You may have a small amount of blood in your poop. You may pass gas. You may have mild cramps or bloating in your belly (abdomen). If you were given a sedative during your procedure, do not drive or use machines until your doctor says that it is safe. It is up to you to get the results of your procedure. Ask how to get your results when they are ready. Summary A colonoscopy is an exam to look at the large intestine. Follow instructions from your doctor about eating and drinking before the procedure. You may be prescribed an oral bowel prep to clean out your colon. Take it as told by your doctor. A flexible tube with a camera on its end will be put into the opening of the butt. It will be passed into the large intestine. This information is not intended to replace advice given to you by your health care provider. Make sure you discuss any questions you have with your health care provider. Document Revised: 02/17/2021 Document  Reviewed: 10/16/2020 Elsevier Patient Education  [place].

## 2022-05-15 NOTE — Assessment & Plan Note (Signed)
Noted on exam, upper extremities.  Recommend monitor skin closely for breakdown and alert provider if present.  Gentle skin care at home.  CBC on labs today.

## 2022-05-16 LAB — CBC WITH DIFFERENTIAL/PLATELET
Basophils Absolute: 0.1 10*3/uL (ref 0.0–0.2)
Basos: 1 %
EOS (ABSOLUTE): 0.1 10*3/uL (ref 0.0–0.4)
Eos: 2 %
Hematocrit: 36.9 % (ref 34.0–46.6)
Hemoglobin: 12.3 g/dL (ref 11.1–15.9)
Immature Grans (Abs): 0 10*3/uL (ref 0.0–0.1)
Immature Granulocytes: 0 %
Lymphocytes Absolute: 2.9 10*3/uL (ref 0.7–3.1)
Lymphs: 48 %
MCH: 31.9 pg (ref 26.6–33.0)
MCHC: 33.3 g/dL (ref 31.5–35.7)
MCV: 96 fL (ref 79–97)
Monocytes Absolute: 0.4 10*3/uL (ref 0.1–0.9)
Monocytes: 6 %
Neutrophils Absolute: 2.6 10*3/uL (ref 1.4–7.0)
Neutrophils: 43 %
Platelets: 316 10*3/uL (ref 150–450)
RBC: 3.85 x10E6/uL (ref 3.77–5.28)
RDW: 11.9 % (ref 11.7–15.4)
WBC: 6 10*3/uL (ref 3.4–10.8)

## 2022-05-16 LAB — COMPREHENSIVE METABOLIC PANEL
ALT: 11 IU/L (ref 0–32)
AST: 23 IU/L (ref 0–40)
Albumin/Globulin Ratio: 1.7 (ref 1.2–2.2)
Albumin: 4.1 g/dL (ref 3.8–4.8)
Alkaline Phosphatase: 63 IU/L (ref 44–121)
BUN/Creatinine Ratio: 14 (ref 12–28)
BUN: 12 mg/dL (ref 8–27)
Bilirubin Total: <0.2 mg/dL (ref 0.0–1.2)
CO2: 25 mmol/L (ref 20–29)
Calcium: 9.8 mg/dL (ref 8.7–10.3)
Chloride: 101 mmol/L (ref 96–106)
Creatinine, Ser: 0.86 mg/dL (ref 0.57–1.00)
Globulin, Total: 2.4 g/dL (ref 1.5–4.5)
Glucose: 71 mg/dL (ref 70–99)
Potassium: 4.8 mmol/L (ref 3.5–5.2)
Sodium: 142 mmol/L (ref 134–144)
Total Protein: 6.5 g/dL (ref 6.0–8.5)
eGFR: 71 mL/min/{1.73_m2} (ref >59)

## 2022-05-16 LAB — TSH: TSH: 1.16 u[IU]/mL (ref 0.450–4.500)

## 2022-05-16 LAB — LIPID PANEL W/O CHOL/HDL RATIO
Cholesterol, Total: 161 mg/dL (ref 100–199)
HDL: 76 mg/dL (ref >39)
LDL Chol Calc (NIH): 69 mg/dL (ref 0–99)
Triglycerides: 86 mg/dL (ref 0–149)
VLDL Cholesterol Cal: 16 mg/dL (ref 5–40)

## 2022-05-16 LAB — VITAMIN D 25 HYDROXY (VIT D DEFICIENCY, FRACTURES): Vit D, 25-Hydroxy: 80.3 ng/mL (ref 30.0–100.0)

## 2022-05-16 NOTE — Progress Notes (Signed)
Contacted via [place] evening Debbie, your labs have returned and everything is overall stable.  No medication changes needed.  Any questions? Keep being amazing!!  Thank you for allowing me to participate in your care.  I appreciate you. Kindest regards, Stepanie Graver

## 2022-05-19 LAB — DRUG SCREEN 764883 11+OXYCO+ALC+CRT-BUND
Amphetamines, Urine: NEGATIVE ng/mL
BENZODIAZ UR QL: NEGATIVE ng/mL
Barbiturate: NEGATIVE ng/mL
Cannabinoid Quant, Ur: NEGATIVE ng/mL
Cocaine (Metabolite): NEGATIVE ng/mL
Creatinine: 69.9 mg/dL (ref 20.0–300.0)
Ethanol: NEGATIVE %
Meperidine: NEGATIVE ng/mL
Methadone Screen, Urine: NEGATIVE ng/mL
OPIATE SCREEN URINE: NEGATIVE ng/mL
Oxycodone/Oxymorphone, Urine: NEGATIVE ng/mL
Phencyclidine: NEGATIVE ng/mL
Propoxyphene: NEGATIVE ng/mL
Tramadol: NEGATIVE ng/mL
pH, Urine: 6.4 (ref 4.5–8.9)

## 2022-06-01 ENCOUNTER — Ambulatory Visit (INDEPENDENT_AMBULATORY_CARE_PROVIDER_SITE_OTHER): Payer: PPO

## 2022-06-01 VITALS — Ht 61.5 in | Wt 107.0 lb

## 2022-06-01 DIAGNOSIS — Z0000 Encounter for general adult medical examination without abnormal findings: Secondary | ICD-10-CM | POA: Diagnosis not present

## 2022-06-01 NOTE — Patient Instructions (Signed)
Brandy Parker , Thank you for taking time to come for your Medicare Wellness Visit. I appreciate your ongoing commitment to your health goals. Please review the following plan we discussed and let me know if I can assist you in the future.   These are the goals we discussed:  Goals      DIET - EAT MORE FRUITS AND VEGETABLES     Patient Stated     05/27/2020, no goals     Patient Stated     No goals        This is a list of the screening recommended for you and due dates:  Health Maintenance  Topic Date Due   Zoster (Shingles) Vaccine (1 of 2) Never done   COVID-19 Vaccine (5 - 2023-24 season) 11/07/2021   Flu Shot  06/07/2022*   Colon Cancer Screening  05/15/2023*   Medicare Annual Wellness Visit  06/01/2023   Mammogram  02/12/2024   DEXA scan (bone density measurement)  02/12/2027   DTaP/Tdap/Td vaccine (2 - Td or Tdap) 12/14/2029   Pneumonia Vaccine  Completed   Hepatitis C Screening: USPSTF Recommendation to screen - Ages 65-79 yo.  Completed   HPV Vaccine  Aged Out  *Topic was postponed. The date shown is not the original due date.    Advanced directives: no  Conditions/risks identified: none  Next appointment: Follow up in one year for your annual wellness visit 06/07/23 @ 1:00 pm by phone   Preventive Care 65 Years and Older, Female Preventive care refers to lifestyle choices and visits with your health care provider that can promote health and wellness. What does preventive care include? A yearly physical exam. This is also called an annual well check. Dental exams once or twice a year. Routine eye exams. Ask your health care provider how often you should have your eyes checked. Personal lifestyle choices, including: Daily care of your teeth and gums. Regular physical activity. Eating a healthy diet. Avoiding tobacco and drug use. Limiting alcohol use. Practicing safe sex. Taking low-dose aspirin every day. Taking vitamin and mineral supplements as recommended  by your health care provider. What happens during an annual well check? The services and screenings done by your health care provider during your annual well check will depend on your age, overall health, lifestyle risk factors, and family history of disease. Counseling  Your health care provider may ask you questions about your: Alcohol use. Tobacco use. Drug use. Emotional well-being. Home and relationship well-being. Sexual activity. Eating habits. History of falls. Memory and ability to understand (cognition). Work and work Statistician. Reproductive health. Screening  You may have the following tests or measurements: Height, weight, and BMI. Blood pressure. Lipid and cholesterol levels. These may be checked every 5 years, or more frequently if you are over 4 years old. Skin check. Lung cancer screening. You may have this screening every year starting at age 34 if you have a 30-pack-year history of smoking and currently smoke or have quit within the past 15 years. Fecal occult blood test (FOBT) of the stool. You may have this test every year starting at age 69. Flexible sigmoidoscopy or colonoscopy. You may have a sigmoidoscopy every 5 years or a colonoscopy every 10 years starting at age 35. Hepatitis C blood test. Hepatitis B blood test. Sexually transmitted disease (STD) testing. Diabetes screening. This is done by checking your blood sugar (glucose) after you have not eaten for a while (fasting). You may have this done every 1-3 years.  Bone density scan. This is done to screen for osteoporosis. You may have this done starting at age 80. Mammogram. This may be done every 1-2 years. Talk to your health care provider about how often you should have regular mammograms. Talk with your health care provider about your test results, treatment options, and if necessary, the need for more tests. Vaccines  Your health care provider may recommend certain vaccines, such as: Influenza  vaccine. This is recommended every year. Tetanus, diphtheria, and acellular pertussis (Tdap, Td) vaccine. You may need a Td booster every 10 years. Zoster vaccine. You may need this after age 48. Pneumococcal 13-valent conjugate (PCV13) vaccine. One dose is recommended after age 29. Pneumococcal polysaccharide (PPSV23) vaccine. One dose is recommended after age 66. Talk to your health care provider about which screenings and vaccines you need and how often you need them. This information is not intended to replace advice given to you by your health care provider. Make sure you discuss any questions you have with your health care provider. Document Released: 03/22/2015 Document Revised: 11/13/2015 Document Reviewed: 12/25/2014 Elsevier Interactive Patient Education  2017 [employer] Prevention in the Home Falls can cause injuries. They can happen to people of all ages. There are many things you can do to make your home safe and to help prevent falls. What can I do on the outside of my home? Regularly fix the edges of walkways and driveways and fix any cracks. Remove anything that might make you trip as you walk through a door, such as a raised step or threshold. Trim any bushes or trees on the path to your home. Use bright outdoor lighting. Clear any walking paths of anything that might make someone trip, such as rocks or tools. Regularly check to see if handrails are loose or broken. Make sure that both sides of any steps have handrails. Any raised decks and porches should have guardrails on the edges. Have any leaves, snow, or ice cleared regularly. Use sand or salt on walking paths during winter. Clean up any spills in your garage right away. This includes oil or grease spills. What can I do in the bathroom? Use night lights. Install grab bars by the toilet and in the tub and shower. Do not use towel bars as grab bars. Use non-skid mats or decals in the tub or shower. If you  need to sit down in the shower, use a plastic, non-slip stool. Keep the floor dry. Clean up any water that spills on the floor as soon as it happens. Remove soap buildup in the tub or shower regularly. Attach bath mats securely with double-sided non-slip rug tape. Do not have throw rugs and other things on the floor that can make you trip. What can I do in the bedroom? Use night lights. Make sure that you have a light by your bed that is easy to reach. Do not use any sheets or blankets that are too big for your bed. They should not hang down onto the floor. Have a firm chair that has side arms. You can use this for support while you get dressed. Do not have throw rugs and other things on the floor that can make you trip. What can I do in the kitchen? Clean up any spills right away. Avoid walking on wet floors. Keep items that you use a lot in easy-to-reach places. If you need to reach something above you, use a strong step stool that has a grab bar.  Keep electrical cords out of the way. Do not use floor polish or wax that makes floors slippery. If you must use wax, use non-skid floor wax. Do not have throw rugs and other things on the floor that can make you trip. What can I do with my stairs? Do not leave any items on the stairs. Make sure that there are handrails on both sides of the stairs and use them. Fix handrails that are broken or loose. Make sure that handrails are as long as the stairways. Check any carpeting to make sure that it is firmly attached to the stairs. Fix any carpet that is loose or worn. Avoid having throw rugs at the top or bottom of the stairs. If you do have throw rugs, attach them to the floor with carpet tape. Make sure that you have a light switch at the top of the stairs and the bottom of the stairs. If you do not have them, ask someone to add them for you. What else can I do to help prevent falls? Wear shoes that: Do not have high heels. Have rubber  bottoms. Are comfortable and fit you well. Are closed at the toe. Do not wear sandals. If you use a stepladder: Make sure that it is fully opened. Do not climb a closed stepladder. Make sure that both sides of the stepladder are locked into place. Ask someone to hold it for you, if possible. Clearly mark and make sure that you can see: Any grab bars or handrails. First and last steps. Where the edge of each step is. Use tools that help you move around (mobility aids) if they are needed. These include: Canes. Walkers. Scooters. Crutches. Turn on the lights when you go into a dark area. Replace any light bulbs as soon as they burn out. Set up your furniture so you have a clear path. Avoid moving your furniture around. If any of your floors are uneven, fix them. If there are any pets around you, be aware of where they are. Review your medicines with your doctor. Some medicines can make you feel dizzy. This can increase your chance of falling. Ask your doctor what other things that you can do to help prevent falls. This information is not intended to replace advice given to you by your health care provider. Make sure you discuss any questions you have with your health care provider. Document Released: 12/20/2008 Document Revised: 08/01/2015 Document Reviewed: 03/30/2014 Elsevier Interactive Patient Education  2017 [employer].

## 2022-06-01 NOTE — Progress Notes (Signed)
I connected with  Brandy Parker on 06/01/22 by a audio enabled telemedicine application and verified that I am speaking with the correct person using two identifiers.  Patient Location: Home  Provider Location: Office/Clinic  I discussed the limitations of evaluation and management by telemedicine. The patient expressed understanding and agreed to proceed.  Subjective:   Brandy Parker is a 72 y.o. female who presents for Medicare Annual (Subsequent) preventive examination.  Review of Systems     Cardiac Risk Factors include: advanced age (>43men, >61 women);smoking/ tobacco exposure     Objective:    There were no vitals filed for this visit. There is no height or weight on file to calculate BMI.     06/01/2022    1:32 PM 05/29/2021   11:21 AM 05/27/2020   11:20 AM 05/18/2019    8:50 AM 07/20/2016    1:08 PM 07/17/2016    7:45 PM  Advanced Directives  Does Patient Have a Medical Advance Directive? No No No No No No  Would patient like information on creating a medical advance directive? No - Patient declined No - Patient declined   No - Patient declined No - Patient declined    Current Medications (verified) Outpatient Encounter Medications as of 06/01/2022  Medication Sig   ALPRAZolam (XANAX) 0.25 MG tablet Take 1 tablet (0.25 mg total) by mouth at bedtime as needed for anxiety.   cholecalciferol (VITAMIN D3) 25 MCG (1000 UNIT) tablet Take 1,000 Units by mouth daily.   escitalopram (LEXAPRO) 10 MG tablet Take 1 tablet (10 mg total) by mouth daily.   Multiple Vitamin (MULTIVITAMIN) tablet Take 1 tablet by mouth daily.   simvastatin (ZOCOR) 20 MG tablet Take 1 tablet (20 mg total) by mouth daily at 6 PM.   No facility-administered encounter medications on file as of 06/01/2022.    Allergies (verified) Sulfa antibiotics   History: Past Medical History:  Diagnosis Date   Anxiety    Hyperlipemia    Past Surgical History:  Procedure Laterality Date   ORIF ANKLE FRACTURE  Left 07/20/2016   Procedure: OPEN REDUCTION INTERNAL FIXATION (ORIF) ANKLE FRACTURE;  Surgeon: Lovell Sheehan, MD;  Location: [facility] ORS;  Service: Orthopedics;  Laterality: Left;   Family History  Problem Relation Age of Onset   Hypertension Mother    Heart failure Mother    Hypertension Father    Hyperlipidemia Father    Cancer Father        bladder   Heart failure Father    Cancer Sister        bladder   Breast cancer Neg Hx    Social History   Socioeconomic History   Marital status: Single    Spouse name: Not on file   Number of children: Not on file   Years of education: Not on file   Highest education level: Not on file  Occupational History   Occupation: fulltime     Employer: [employer]  Tobacco Use   Smoking status: Every Day    Packs/day: .25    Types: Cigarettes   Smokeless tobacco: Never  Vaping Use   Vaping Use: Never used  Substance and Sexual Activity   Alcohol use: No   Drug use: No   Sexual activity: Not Currently  Other Topics Concern   Not on file  Social History Narrative   Not on file   Social Determinants of Health   Financial Resource Strain: Low Risk  (06/01/2022)   Overall Financial Resource  Strain (CARDIA)    Difficulty of Paying Living Expenses: Not hard at all  Food Insecurity: No Food Insecurity (06/01/2022)   Hunger Vital Sign    Worried About Running Out of Food in the Last Year: Never true    Ran Out of Food in the Last Year: Never true  Transportation Needs: No Transportation Needs (06/01/2022)   PRAPARE - Hydrologist (Medical): No    Lack of Transportation (Non-Medical): No  Physical Activity: Insufficiently Active (06/01/2022)   Exercise Vital Sign    Days of Exercise per Week: 3 days    Minutes of Exercise per Session: 30 min  Stress: No Stress Concern Present (06/01/2022)   [place]    Feeling of Stress : Not at all  Social  Connections: Socially Isolated (06/01/2022)   Social Connection and Isolation Panel [NHANES]    Frequency of Communication with Friends and Family: Once a week    Frequency of Social Gatherings with Friends and Family: More than three times a week    Attends Religious Services: Never    Marine scientist or Organizations: No    Attends Music therapist: Never    Marital Status: Never married    Tobacco Counseling Ready to quit: Not Answered Counseling given: Not Answered   Clinical Intake:  Pre-visit preparation completed: Yes  Pain : No/denies pain     Nutritional Risks: None Diabetes: No  How often do you need to have someone help you when you read instructions, pamphlets, or other written materials from your doctor or pharmacy?: 1 - Never  Diabetic?no  Interpreter Needed?: No  Information entered by :: Kirke Shaggy, LPN   Activities of Daily Living    06/01/2022    1:33 PM 05/15/2022    1:13 PM  In your present state of health, do you have any difficulty performing the following activities:  Hearing? 0 0  Vision? 0 0  Difficulty concentrating or making decisions? 0 0  Walking or climbing stairs? 0 0  Dressing or bathing? 0 0  Doing errands, shopping? 0 0  Preparing Food and eating ? N   Using the Toilet? N   In the past six months, have you accidently leaked urine? N   Do you have problems with loss of bowel control? N   Managing your Medications? N   Managing your Finances? N   Housekeeping or managing your Housekeeping? N     Patient Care Team: Venita Lick, NP as PCP - General (Nurse Practitioner)  Indicate any recent Medical Services you may have received from other than [facility] providers in the past year (date may be approximate).     Assessment:   This is a routine wellness examination for Lanicia.  Hearing/Vision screen Hearing Screening - Comments:: No aids Vision Screening - Comments:: Wears glasses- Dr. Ellin Mayhew  Dietary  issues and exercise activities discussed: Current Exercise Habits: Home exercise routine, Type of exercise: walking, Time (Minutes): 30, Frequency (Times/Week): 3, Weekly Exercise (Minutes/Week): 90, Intensity: Mild   Goals Addressed             This Visit's Progress    DIET - EAT MORE FRUITS AND VEGETABLES         Depression Screen    06/01/2022    1:31 PM 05/15/2022    1:15 PM 02/13/2022    1:27 PM 11/07/2021    1:16 PM 08/06/2021  1:06 PM 05/29/2021   11:22 AM 05/07/2021    1:05 PM  PHQ 2/9 Scores  PHQ - 2 Score 0 0 0 0 0 0 0  PHQ- 9 Score 0 0 0 0 0 0 0    Fall Risk    06/01/2022    1:33 PM 05/15/2022    1:14 PM 02/13/2022    1:26 PM 11/07/2021    1:20 PM 08/06/2021    1:07 PM  [place] in the past year? 0 0 0 0 0  Number falls in past yr: 0 0 0 0 0  Injury with Fall? 0 0 0 0 1  Risk for fall due to : No Fall Risks No Fall Risks No Fall Risks No Fall Risks History of fall(s)  Follow up Falls prevention discussed;Falls evaluation completed Falls evaluation completed Falls evaluation completed Falls evaluation completed Falls evaluation completed    FALL RISK PREVENTION PERTAINING TO THE HOME:  Any stairs in or around the home? Yes  If so, are there any without handrails? No  Home free of loose throw rugs in walkways, pet beds, electrical cords, etc? Yes  Adequate lighting in your home to reduce risk of falls? Yes   ASSISTIVE DEVICES UTILIZED TO PREVENT FALLS:  Life alert? No  Use of a cane, walker or w/c? No  Grab bars in the bathroom? No  Shower chair or bench in shower? No  Elevated toilet seat or a handicapped toilet? No   Cognitive Function:        06/01/2022    1:39 PM 05/27/2020   11:23 AM  6CIT Screen  What Year? 0 points 0 points  What month? 0 points 0 points  What time? 0 points 0 points  Count back from 20 0 points 0 points  Months in reverse 0 points 0 points  Repeat phrase  0 points  Total Score  0 points     Immunizations Immunization History  Administered Date(s) Administered   Fluad Quad(high Dose 65+) 01/26/2020, 05/07/2021   Influenza,inj,quad, With Preservative 01/02/2019   PFIZER(Purple Top)SARS-COV-2 Vaccination 07/06/2019, 08/01/2019, 02/14/2020, 11/05/2020   Pneumococcal Conjugate-13 03/31/2019   Pneumococcal Polysaccharide-23 05/03/2020   Tdap 12/15/2019    TDAP status: Up to date  Flu Vaccine status: Up to date  Pneumococcal vaccine status: Up to date  Covid-19 vaccine status: Completed vaccines  Qualifies for Shingles Vaccine? Yes   Zostavax completed No   Shingrix Completed?: No.    Education has been provided regarding the importance of this vaccine. Patient has been advised to call insurance company to determine out of pocket expense if they have not yet received this vaccine. Advised may also receive vaccine at local pharmacy or Health Dept. Verbalized acceptance and understanding.  Screening Tests Health Maintenance  Topic Date Due   Zoster Vaccines- Shingrix (1 of 2) Never done   COVID-19 Vaccine (5 - 2023-24 season) 11/07/2021   INFLUENZA VACCINE  06/07/2022 (Originally 10/07/2021)   COLONOSCOPY (Pts 45-53yrs Insurance coverage will need to be confirmed)  05/15/2023 (Originally 02/04/1994)   Medicare Annual Wellness ([place])  06/01/2023   MAMMOGRAM  02/12/2024   DEXA SCAN  02/12/2027   DTaP/Tdap/Td (2 - Td or Tdap) 12/14/2029   Pneumonia Vaccine 54+ Years old  Completed   Hepatitis C Screening  Completed   HPV VACCINES  Aged Out    Health Maintenance  Health Maintenance Due  Topic Date Due   Zoster Vaccines- Shingrix (1 of 2) Never done  COVID-19 Vaccine (5 - 2023-24 season) 11/07/2021    Colorectal cancer screening: Type of screening: Colonoscopy. Completed postponed by MD until 06/07/22. Repeat every 10 years  Mammogram status: Completed 02/11/22. Repeat every year  Bone Density status: Completed 02/11/22. Results reflect: Bone density results:  OSTEOPENIA. Repeat every 5 years.  Lung Cancer Screening: (Low Dose CT Chest recommended if Age 24-80 years, 30 pack-year currently smoking OR have quit w/in 15years.) does not qualify.   Additional Screening:  Hepatitis C Screening: does qualify; Completed 05/03/20  Vision Screening: Recommended annual ophthalmology exams for early detection of glaucoma and other disorders of the eye. Is the patient up to date with their annual eye exam?  Yes  Who is the provider or what is the name of the office in which the patient attends annual eye exams? Dr.Woodard If pt is not established with a provider, would they like to be referred to a provider to establish care? No .   Dental Screening: Recommended annual dental exams for proper oral hygiene  Community Resource Referral / Chronic Care Management: CRR required this visit?  No   CCM required this visit?  No      Plan:     I have personally reviewed and noted the following in the patient's chart:   Medical and social history Use of alcohol, tobacco or illicit drugs  Current medications and supplements including opioid prescriptions. Patient is not currently taking opioid prescriptions. Functional ability and status Nutritional status Physical activity Advanced directives List of other physicians Hospitalizations, surgeries, and ER visits in previous 12 months Vitals Screenings to include cognitive, depression, and falls Referrals and appointments  In addition, I have reviewed and discussed with patient certain preventive protocols, quality metrics, and best practice recommendations. A written personalized care plan for preventive services as well as general preventive health recommendations were provided to patient.     Dionisio David, LPN   [id number]   Nurse Notes: none

## 2022-08-16 NOTE — Patient Instructions (Signed)

## 2022-08-21 ENCOUNTER — Ambulatory Visit (INDEPENDENT_AMBULATORY_CARE_PROVIDER_SITE_OTHER): Payer: PPO | Admitting: Nurse Practitioner

## 2022-08-21 ENCOUNTER — Encounter: Payer: Self-pay | Admitting: Nurse Practitioner

## 2022-08-21 VITALS — BP 144/80 | HR 69 | Temp 98.6°F | Ht 61.5 in | Wt 105.8 lb

## 2022-08-21 DIAGNOSIS — F17210 Nicotine dependence, cigarettes, uncomplicated: Secondary | ICD-10-CM | POA: Diagnosis not present

## 2022-08-21 DIAGNOSIS — I10 Essential (primary) hypertension: Secondary | ICD-10-CM | POA: Insufficient documentation

## 2022-08-21 DIAGNOSIS — F411 Generalized anxiety disorder: Secondary | ICD-10-CM | POA: Diagnosis not present

## 2022-08-21 DIAGNOSIS — R030 Elevated blood-pressure reading, without diagnosis of hypertension: Secondary | ICD-10-CM | POA: Diagnosis not present

## 2022-08-21 MED ORDER — ALPRAZOLAM 0.25 MG PO TABS: 0.2500 mg | ORAL_TABLET | Freq: Every evening | ORAL | 0 refills | Status: DC | PRN

## 2022-08-21 NOTE — Progress Notes (Signed)
.cfpb   BP (!) 144/80 (BP Location: Left Arm, Patient Position: Sitting, Cuff Size: Normal)   Pulse 69   Temp 98.6 F (37 C) (Oral)   Ht 5' 1.5" (1.562 m)   Wt 105 lb 12.8 oz (48 kg)   SpO2 98%   BMI 19.67 kg/m    Subjective:    Patient ID: Brandy Parker, female    DOB: Dec 06, 1950, 72 y.o.   MRN: [id number]  HPI: Brandy Parker is a 72 y.o. female  Chief Complaint  Patient presents with   Anxiety   ANXIETY/STRESS Continues Xanax 0.25 MG PRN (5 nights a week) and Lexapro 10 MG daily.   Been on Xanax for a long while per her report, since 2008  Pt is aware of risks of benzo medication use to include increased sedation, respiratory suppression, falls, dependence and cardiovascular events. Pt would like to continue treatment as benefit determined to outweigh risk.  On PDMP review her last fill Xanax was 05/15/22 for 90 day supply. Previously got 90 day supply from past provider.  Continues to smoke cigarettes. Duration:stable Anxious mood: no  Excessive worrying: no Irritability: no  Sweating: no Nausea: no Palpitations:no Hyperventilation: no Panic attacks: no Agoraphobia: no  Obscessions/compulsions: no Depressed mood: no    06/01/2022    1:31 PM 05/15/2022    1:15 PM 02/13/2022    1:27 PM 11/07/2021    1:16 PM 08/06/2021    1:06 PM  Depression screen PHQ 2/9  Decreased Interest 0 0 0 0 0  Down, Depressed, Hopeless 0 0 0 0 0  PHQ - 2 Score 0 0 0 0 0  Altered sleeping 0 0 0 0 0  Tired, decreased energy 0 0 0 0 0  Change in appetite 0 0 0 0 0  Feeling bad or failure about yourself  0 0 0 0 0  Trouble concentrating 0 0 0 0 0  Moving slowly or fidgety/restless 0 0 0 0 0  Suicidal thoughts 0 0 0 0 0  PHQ-9 Score 0 0 0 0 0  Difficult doing work/chores Not difficult at all Not difficult at all Not difficult at all Not difficult at all Not difficult at all      05/15/2022    1:15 PM 02/13/2022    1:27 PM 11/07/2021    1:16 PM 08/06/2021    1:06 PM  GAD 7 : Generalized Anxiety  Score  Nervous, Anxious, on Edge 0 0 0 0  Control/stop worrying 0 0 0 0  Worry too much - different things 0 0 0 0  Trouble relaxing 0 0 0 0  Restless 0 0 0 0  Easily annoyed or irritable 0 0 0 0  Afraid - awful might happen 0 0 0 0  Total GAD 7 Score 0 0 0 0  Anxiety Difficulty Not difficult at all Not difficult at all Not difficult at all Not difficult at all   Relevant past medical, surgical, family and social history reviewed and updated as indicated. Interim medical history since our last visit reviewed. Allergies and medications reviewed and updated.  Review of Systems  Constitutional:  Negative for activity change, appetite change, diaphoresis, fatigue and fever.  HENT:  Negative for ear discharge and ear pain.   Respiratory:  Negative for cough, chest tightness and shortness of breath.   Cardiovascular:  Negative for chest pain, palpitations and leg swelling.  Gastrointestinal: Negative.   Neurological: Negative.   Psychiatric/Behavioral:  Negative for decreased concentration, self-injury,  sleep disturbance and suicidal ideas. The patient is not nervous/anxious.     Per HPI unless specifically indicated above     Objective:    BP (!) 144/80 (BP Location: Left Arm, Patient Position: Sitting, Cuff Size: Normal)   Pulse 69   Temp 98.6 F (37 C) (Oral)   Ht 5' 1.5" (1.562 m)   Wt 105 lb 12.8 oz (48 kg)   SpO2 98%   BMI 19.67 kg/m   Wt Readings from Last 3 Encounters:  08/21/22 105 lb 12.8 oz (48 kg)  06/01/22 107 lb (48.5 kg)  05/15/22 107 lb 14.4 oz (48.9 kg)    Physical Exam Vitals and nursing note reviewed.  Constitutional:      General: She is awake. She is not in acute distress.    Appearance: She is well-developed and well-groomed. She is not ill-appearing.  HENT:     Head: Normocephalic.     Right Ear: Hearing and external ear normal.     Left Ear: Hearing and external ear normal.  Eyes:     General: Lids are normal.        Right eye: No discharge.         Left eye: No discharge.     Conjunctiva/sclera: Conjunctivae normal.     Pupils: Pupils are equal, round, and reactive to light.  Neck:     Vascular: No carotid bruit.  Cardiovascular:     Rate and Rhythm: Normal rate and regular rhythm.     Heart sounds: Normal heart sounds. No murmur heard.    No gallop.  Pulmonary:     Effort: Pulmonary effort is normal. No accessory muscle usage or respiratory distress.     Breath sounds: Normal breath sounds.  Abdominal:     General: Bowel sounds are normal.     Palpations: Abdomen is soft.  Musculoskeletal:     Cervical back: Normal range of motion and neck supple.     Right lower leg: No edema.     Left lower leg: No edema.  Skin:    General: Skin is warm and dry.  Neurological:     Mental Status: She is alert and oriented to person, place, and time.  Psychiatric:        Attention and Perception: Attention normal.        Mood and Affect: Mood normal.        Speech: Speech normal.        Behavior: Behavior normal. Behavior is cooperative.        Thought Content: Thought content normal.    Results for orders placed or performed in visit on 05/15/22  [id number] 11+Oxyco+Alc+Crt-Bund  Result Value Ref Range   Ethanol Negative Cutoff=0.020 %   Amphetamines, Urine Negative Cutoff=1000 ng/mL   Barbiturate Negative Cutoff=200 ng/mL   BENZODIAZ UR QL Negative Cutoff=200 ng/mL   Cannabinoid Quant, Ur Negative Cutoff=50 ng/mL   Cocaine (Metabolite) Negative Cutoff=300 ng/mL   OPIATE SCREEN URINE Negative Cutoff=300 ng/mL   Oxycodone/Oxymorphone, Urine Negative Cutoff=300 ng/mL   Phencyclidine Negative Cutoff=25 ng/mL   Methadone Screen, Urine Negative Cutoff=300 ng/mL   Propoxyphene Negative Cutoff=300 ng/mL   Meperidine Negative Cutoff=200 ng/mL   Tramadol Negative Cutoff=200 ng/mL   Creatinine 69.9 20.0 - 300.0 mg/dL   pH, Urine 6.4 4.5 - 8.9  CBC with Differential/Platelet  Result Value Ref Range   WBC 6.0 3.4 - 10.8 x10E3/uL   RBC  3.85 3.77 - 5.28 x10E6/uL   Hemoglobin 12.3 11.1 - 15.9  g/dL   Hematocrit 16.1 09.6 - 46.6 %   MCV 96 79 - 97 fL   MCH 31.9 26.6 - 33.0 pg   MCHC 33.3 31.5 - 35.7 g/dL   RDW 04.5 40.9 - 81.1 %   Platelets 316 150 - 450 x10E3/uL   Neutrophils 43 Not Estab. %   Lymphs 48 Not Estab. %   Monocytes 6 Not Estab. %   Eos 2 Not Estab. %   Basos 1 Not Estab. %   Neutrophils Absolute 2.6 1.4 - 7.0 x10E3/uL   Lymphocytes Absolute 2.9 0.7 - 3.1 x10E3/uL   Monocytes Absolute 0.4 0.1 - 0.9 x10E3/uL   EOS (ABSOLUTE) 0.1 0.0 - 0.4 x10E3/uL   Basophils Absolute 0.1 0.0 - 0.2 x10E3/uL   Immature Granulocytes 0 Not Estab. %   Immature Grans (Abs) 0.0 0.0 - 0.1 x10E3/uL  Comprehensive metabolic panel  Result Value Ref Range   Glucose 71 70 - 99 mg/dL   BUN 12 8 - 27 mg/dL   Creatinine, Ser 9.14 0.57 - 1.00 mg/dL   eGFR 71 >78 GN/FAO/1.30   BUN/Creatinine Ratio 14 12 - 28   Sodium 142 134 - 144 mmol/L   Potassium 4.8 3.5 - 5.2 mmol/L   Chloride 101 96 - 106 mmol/L   CO2 25 20 - 29 mmol/L   Calcium 9.8 8.7 - 10.3 mg/dL   Total Protein 6.5 6.0 - 8.5 g/dL   Albumin 4.1 3.8 - 4.8 g/dL   Globulin, Total 2.4 1.5 - 4.5 g/dL   Albumin/Globulin Ratio 1.7 1.2 - 2.2   Bilirubin Total <0.2 0.0 - 1.2 mg/dL   Alkaline Phosphatase 63 44 - 121 IU/L   AST 23 0 - 40 IU/L   ALT 11 0 - 32 IU/L  VITAMIN D 25 Hydroxy (Vit-D Deficiency, Fractures)  Result Value Ref Range   Vit D, 25-Hydroxy 80.3 30.0 - 100.0 ng/mL  Lipid Panel w/o Chol/HDL Ratio  Result Value Ref Range   Cholesterol, Total 161 100 - 199 mg/dL   Triglycerides 86 0 - 149 mg/dL   HDL 76 >86 mg/dL   VLDL Cholesterol Cal 16 5 - 40 mg/dL   LDL Chol Calc (NIH) 69 0 - 99 mg/dL  TSH  Result Value Ref Range   TSH 1.160 0.450 - 4.500 uIU/mL      Assessment & Plan:   Problem List Items Addressed This Visit       Other   Elevated BP without diagnosis of hypertension    Noted today at visit, with trend down but still above goal.  For next few  months she will focus on diet changes, DASH, and regular activity.  Check BP 3 days a week at home and bring log to visit.  If ongoing elevations next visit will start ARB, avoid ACE due to smoking, educated her on this medication category and side effects.  She is agreeable to this plan.  Recommend cut back on smoking.      Generalized anxiety disorder - Primary    Chronic, stable.  Has reduced use of Xanax -- using lowest dose possible for her.  Only taking Xanax 0.25 MG at night 5 days a week.  Have sent in refill 0.25 MG QHS #90 tablets with 0 refills.  Had discussion about BEERS criteria and risks with these.   Will continue to monitor, she is up to date on contract and UDS due 05/15/23.  Continue Lexapro.  She agrees to every 3 month visits, will return  in 3 months.      Relevant Medications   ALPRAZolam (XANAX) 0.25 MG tablet   Nicotine dependence, cigarettes, uncomplicated    I have recommended complete cessation of tobacco use. I have discussed various options available for assistance with tobacco cessation including over the counter methods (Nicotine gum, patch and lozenges). We also discussed prescription options (Chantix, Nicotine Inhaler / Nasal Spray). The patient is not interested in pursuing any prescription tobacco cessation options at this time.  Does not want to obtain lung cancer screening.         Follow up plan: Return in about 3 months (around 11/21/2022) for ANXIETY AND BP CHECK.

## 2022-08-21 NOTE — Assessment & Plan Note (Signed)
I have recommended complete cessation of tobacco use. I have discussed various options available for assistance with tobacco cessation including over the counter methods (Nicotine gum, patch and lozenges). We also discussed prescription options (Chantix, Nicotine Inhaler / Nasal Spray). The patient is not interested in pursuing any prescription tobacco cessation options at this time.  Does not want to obtain lung cancer screening.  

## 2022-08-21 NOTE — Assessment & Plan Note (Signed)
Noted today at visit, with trend down but still above goal.  For next few months she will focus on diet changes, DASH, and regular activity.  Check BP 3 days a week at home and bring log to visit.  If ongoing elevations next visit will start ARB, avoid ACE due to smoking, educated her on this medication category and side effects.  She is agreeable to this plan.  Recommend cut back on smoking.

## 2022-08-21 NOTE — Assessment & Plan Note (Signed)
Chronic, stable.  Has reduced use of Xanax -- using lowest dose possible for her.  Only taking Xanax 0.25 MG at night 5 days a week.  Have sent in refill 0.25 MG QHS #90 tablets with 0 refills.  Had discussion about BEERS criteria and risks with these.   Will continue to monitor, she is up to date on contract and UDS due 05/15/23.  Continue Lexapro.  She agrees to every 3 month visits, will return in 3 months.

## 2022-11-09 NOTE — Patient Instructions (Signed)
Be Involved in Caring For Your Health:  Taking Medications When medications are taken as directed, they can greatly improve your health. But if they are not taken as prescribed, they may not work. In some cases, not taking them correctly can be harmful. To help ensure your treatment remains effective and safe, understand your medications and how to take them. Bring your medications to each visit for review by your provider.  Your lab results, notes, and after visit summary will be available on My Chart. We strongly encourage you to use this feature. If lab results are abnormal the clinic will contact you with the appropriate steps. If the clinic does not contact you assume the results are satisfactory. You can always view your results on My Chart. If you have questions regarding your health or results, please contact the clinic during office hours. You can also ask questions on My Chart.  We at [facility] are grateful that you chose [place] to provide your care. We strive to provide evidence-based and compassionate care and are always looking for feedback. If you get a survey from the clinic please complete this so we can hear your opinions.  Generalized Anxiety Disorder, Adult Generalized anxiety disorder (GAD) is a mental health condition. Unlike normal worries, anxiety related to GAD is not triggered by a specific event. These worries do not fade or get better with time. GAD interferes with relationships, work, and school. GAD symptoms can vary from mild to severe. People with severe GAD can have intense waves of anxiety with physical symptoms that are similar to panic attacks. What are the causes? The exact cause of GAD is not known, but the following are believed to have an impact: Differences in natural brain chemicals. Genes passed down from parents to children. Differences in the way threats are perceived. Development and stress during childhood. Personality. What increases the  risk? The following factors may make you more likely to develop this condition: Being female. Having a family history of anxiety disorders. Being very shy. Experiencing very stressful life events, such as the death of a loved one. Having a very stressful family environment. What are the signs or symptoms? People with GAD often worry excessively about many things in their lives, such as their health and family. Symptoms may also include: Mental and emotional symptoms: Worrying excessively about natural disasters. Fear of being late. Difficulty concentrating. Fears that others are judging your performance. Physical symptoms: Fatigue. Headaches, muscle tension, muscle twitches, trembling, or feeling shaky. Feeling like your heart is pounding or beating very fast. Feeling out of breath or like you cannot take a deep breath. Having trouble falling asleep or staying asleep, or experiencing restlessness. Sweating. Nausea, diarrhea, or irritable bowel syndrome (IBS). Behavioral symptoms: Experiencing erratic moods or irritability. Avoidance of new situations. Avoidance of people. Extreme difficulty making decisions. How is this diagnosed? This condition is diagnosed based on your symptoms and medical history. You will also have a physical exam. Your health care provider may perform tests to rule out other possible causes of your symptoms. To be diagnosed with GAD, a person must have anxiety that: Is out of his or her control. Affects several different aspects of his or her life, such as work and relationships. Causes distress that makes him or her unable to take part in normal activities. Includes at least three symptoms of GAD, such as restlessness, fatigue, trouble concentrating, irritability, muscle tension, or sleep problems. Before your health care provider can confirm a diagnosis of  GAD, these symptoms must be present more days than they are not, and they must last for 6 months or  longer. How is this treated? This condition may be treated with: Medicine. Antidepressant medicine is usually prescribed for long-term daily control. Anti-anxiety medicines may be added in severe cases, especially when panic attacks occur. Talk therapy (psychotherapy). Certain types of talk therapy can be helpful in treating GAD by providing support, education, and guidance. Options include: Cognitive behavioral therapy (CBT). People learn coping skills and self-calming techniques to ease their physical symptoms. They learn to identify unrealistic thoughts and behaviors and to replace them with more appropriate thoughts and behaviors. Acceptance and commitment therapy (ACT). This treatment teaches people how to be mindful as a way to cope with unwanted thoughts and feelings. Biofeedback. This process trains you to manage your body's response (physiological response) through breathing techniques and relaxation methods. You will work with a therapist while machines are used to monitor your physical symptoms. Stress management techniques. These include yoga, meditation, and exercise. A mental health specialist can help determine which treatment is best for you. Some people see improvement with one type of therapy. However, other people require a combination of therapies. Follow these instructions at home: Lifestyle Maintain a consistent routine and schedule. Anticipate stressful situations. Create a plan and allow extra time to work with your plan. Practice stress management or self-calming techniques that you have learned from your therapist or your health care provider. Exercise regularly and spend time outdoors. Eat a healthy diet that includes plenty of vegetables, fruits, whole grains, low-fat dairy products, and lean protein. Do not eat a lot of foods that are high in fat, added sugar, or salt (sodium). Drink plenty of water. Avoid alcohol. Alcohol can increase anxiety. Avoid caffeine and  certain over-the-counter cold medicines. These may make you feel worse. Ask your pharmacist which medicines to avoid. General instructions Take over-the-counter and prescription medicines only as told by your health care provider. Understand that you are likely to have setbacks. Accept this and be kind to yourself as you persist to take better care of yourself. Anticipate stressful situations. Create a plan and allow extra time to work with your plan. Recognize and accept your accomplishments, even if you judge them as small. Spend time with people who care about you. Keep all follow-up visits. This is important. Where to find more information [employer] of [employer]: http://www.maynard.net/ Substance Abuse and Mental Health Services: [employer].pt Contact a health care provider if: Your symptoms do not get better. Your symptoms get worse. You have signs of depression, such as: A persistently sad or irritable mood. Loss of enjoyment in activities that used to bring you joy. Change in weight or eating. Changes in sleeping habits. Get help right away if: You have thoughts about hurting yourself or others. If you ever feel like you may hurt yourself or others, or have thoughts about taking your own life, get help right away. Go to your nearest emergency department or: Call your local emergency services (911 in the [place] Call a suicide crisis helpline, such as the National Suicide Prevention Lifeline at 660-655-3312 or 988 in the [place] This is open 24 hours a day in the [place] Text the Crisis Text Line at 310-424-0023 (in the [place] Summary Generalized anxiety disorder (GAD) is a mental health condition that involves worry that is not triggered by a specific event. People with GAD often worry excessively about many things in their lives, such as their health and  family. GAD may cause symptoms such as restlessness, trouble concentrating, sleep problems, frequent sweating, nausea, diarrhea,  headaches, and trembling or muscle twitching. A mental health specialist can help determine which treatment is best for you. Some people see improvement with one type of therapy. However, other people require a combination of therapies. This information is not intended to replace advice given to you by your health care provider. Make sure you discuss any questions you have with your health care provider. Document Revised: 09/18/2020 Document Reviewed: 06/16/2020 Elsevier Patient Education  2024 [employer].

## 2022-11-13 ENCOUNTER — Encounter: Payer: Self-pay | Admitting: Nurse Practitioner

## 2022-11-13 ENCOUNTER — Ambulatory Visit (INDEPENDENT_AMBULATORY_CARE_PROVIDER_SITE_OTHER): Payer: PPO | Admitting: Nurse Practitioner

## 2022-11-13 VITALS — BP 128/76 | HR 73 | Temp 98.6°F | Ht 65.5 in | Wt 102.2 lb

## 2022-11-13 DIAGNOSIS — F17210 Nicotine dependence, cigarettes, uncomplicated: Secondary | ICD-10-CM

## 2022-11-13 DIAGNOSIS — R030 Elevated blood-pressure reading, without diagnosis of hypertension: Secondary | ICD-10-CM

## 2022-11-13 DIAGNOSIS — E782 Mixed hyperlipidemia: Secondary | ICD-10-CM | POA: Diagnosis not present

## 2022-11-13 DIAGNOSIS — Z79899 Other long term (current) drug therapy: Secondary | ICD-10-CM

## 2022-11-13 DIAGNOSIS — F411 Generalized anxiety disorder: Secondary | ICD-10-CM | POA: Diagnosis not present

## 2022-11-13 DIAGNOSIS — Z23 Encounter for immunization: Secondary | ICD-10-CM | POA: Diagnosis not present

## 2022-11-13 MED ORDER — ALPRAZOLAM 0.25 MG PO TABS: 0.2500 mg | ORAL_TABLET | Freq: Every evening | ORAL | 0 refills | Status: DC | PRN

## 2022-11-13 NOTE — Assessment & Plan Note (Signed)
I have recommended complete cessation of tobacco use. I have discussed various options available for assistance with tobacco cessation including over the counter methods (Nicotine gum, patch and lozenges). We also discussed prescription options (Chantix, Nicotine Inhaler / Nasal Spray). The patient is not interested in pursuing any prescription tobacco cessation options at this time.  Does not want to obtain lung cancer screening.

## 2022-11-13 NOTE — Assessment & Plan Note (Signed)
Chronic, stable.  Has reduced use of Xanax -- using lowest dose possible for her.  Only taking Xanax 0.25 MG at night 5 days a week.  Have sent in refill 0.25 MG QHS #90 tablets with 0 refills.  Had discussion about BEERS criteria and risks with these.   Will continue to monitor, she is up to date on contract and UDS due 05/15/23.  Continue Lexapro.  She agrees to every 3 month visits, will return in 3 months.

## 2022-11-13 NOTE — Progress Notes (Signed)
BP 128/76 (BP Location: Left Arm, Patient Position: Sitting, Cuff Size: Normal)   Pulse 73   Temp 98.6 F (37 C) (Oral)   Ht 5' 5.5" (1.664 m)   Wt 102 lb 3.2 oz (46.4 kg)   SpO2 98%   BMI 16.75 kg/m    Subjective:    Patient ID: Brandy Parker, female    DOB: 03-05-1951, 72 y.o.   MRN: [id number]  HPI: Brandy Parker is a 72 y.o. female  Chief Complaint  Patient presents with   Anxiety   Hypertension   HYPERTENSION without Chronic Kidney Disease & HLD Continues Simvastatin daily for HLD and no current BP medications. Current smoker, smokes about 5-6 cigarettes a day.  Started smoking in 20's.  Never has been a 1 PPD smoker.  Not interested in quitting.  She is not interested in lung cancer screening.  Hypertension status: stable  Duration of hypertension: chronic BP monitoring frequency:  not checking BP range:  Aspirin: no Recurrent headaches: no Visual changes: no Palpitations: no Dyspnea: no Chest pain: no Lower extremity edema: no Dizzy/lightheaded: no  The 10-year ASCVD risk score (Arnett DK, et al., 2019) is: 18.3%   Values used to calculate the score:     Age: 68 years     Sex: Female     Is Non-Hispanic African American: No     Diabetic: No     Tobacco smoker: Yes     Systolic Blood Pressure: 128 mmHg     Is BP treated: No     HDL Cholesterol: 76 mg/dL     Total Cholesterol: 161 mg/dL   ANXIETY/STRESS Continues Xanax 0.25 MG PRN (5 nights a week) and Lexapro 10 MG daily.    Been on Xanax for a long while per her report, since 2008  Pt is aware of risks of benzo medication use to include increased sedation, respiratory suppression, falls, dependence and cardiovascular events. Pt would like to continue treatment as benefit determined to outweigh risk.  On PDMP review her last fill Xanax was 08/21/22 for 90 day supply. Previously got 90 day supply from past provider.   Duration:stable Anxious mood: no  Excessive worrying: no Irritability: no  Sweating:  no Nausea: no Palpitations:no Hyperventilation: no Panic attacks: no Agoraphobia: no  Obscessions/compulsions: no Depressed mood: no    11/13/2022    1:07 PM 06/01/2022    1:31 PM 05/15/2022    1:15 PM 02/13/2022    1:27 PM 11/07/2021    1:16 PM  Depression screen PHQ 2/9  Decreased Interest 0 0 0 0 0  Down, Depressed, Hopeless 0 0 0 0 0  PHQ - 2 Score 0 0 0 0 0  Altered sleeping 0 0 0 0 0  Tired, decreased energy 0 0 0 0 0  Change in appetite 0 0 0 0 0  Feeling bad or failure about yourself  0 0 0 0 0  Trouble concentrating 0 0 0 0 0  Moving slowly or fidgety/restless 0 0 0 0 0  Suicidal thoughts 0 0 0 0 0  PHQ-9 Score 0 0 0 0 0  Difficult doing work/chores Not difficult at all Not difficult at all Not difficult at all Not difficult at all Not difficult at all  Anhedonia: no Weight changes: no Insomnia: yes hard to stay asleep -- takes Xanax for this Hypersomnia: no Fatigue/loss of energy: occasional Feelings of worthlessness: no Feelings of guilt: no Impaired concentration/indecisiveness: no Suicidal ideations: no  Crying spells:  no Recent Stressors/Life Changes: no   Relationship problems: no   Family stress: no     Financial stress: no    Job stress: no    Recent death/loss: no     2022-12-12    1:08 PM 05/15/2022    1:15 PM 02/13/2022    1:27 PM 11/07/2021    1:16 PM  GAD 7 : Generalized Anxiety Score  Nervous, Anxious, on Edge 0 0 0 0  Control/stop worrying 0 0 0 0  Worry too much - different things 0 0 0 0  Trouble relaxing 0 0 0 0  Restless 0 0 0 0  Easily annoyed or irritable 0 0 0 0  Afraid - awful might happen 0 0 0 0  Total GAD 7 Score 0 0 0 0  Anxiety Difficulty Not difficult at all Not difficult at all Not difficult at all Not difficult at all      Relevant past medical, surgical, family and social history reviewed and updated as indicated. Interim medical history since our last visit reviewed. Allergies and medications reviewed and updated.  Review of  Systems  Constitutional:  Negative for activity change, appetite change, diaphoresis, fatigue and fever.  HENT:  Negative for ear discharge and ear pain.   Respiratory:  Negative for cough, chest tightness and shortness of breath.   Cardiovascular:  Negative for chest pain, palpitations and leg swelling.  Gastrointestinal: Negative.   Neurological: Negative.   Psychiatric/Behavioral:  Negative for decreased concentration, self-injury, sleep disturbance and suicidal ideas. The patient is not nervous/anxious.     Per HPI unless specifically indicated above     Objective:    BP 128/76 (BP Location: Left Arm, Patient Position: Sitting, Cuff Size: Normal)   Pulse 73   Temp 98.6 F (37 C) (Oral)   Ht 5' 5.5" (1.664 m)   Wt 102 lb 3.2 oz (46.4 kg)   SpO2 98%   BMI 16.75 kg/m   Wt Readings from Last 3 Encounters:  December 12, 2022 102 lb 3.2 oz (46.4 kg)  08/21/22 105 lb 12.8 oz (48 kg)  06/01/22 107 lb (48.5 kg)    Physical Exam Vitals and nursing note reviewed.  Constitutional:      General: She is awake. She is not in acute distress.    Appearance: She is well-developed and well-groomed. She is not ill-appearing.  HENT:     Head: Normocephalic.     Right Ear: Hearing and external ear normal.     Left Ear: Hearing and external ear normal.  Eyes:     General: Lids are normal.        Right eye: No discharge.        Left eye: No discharge.     Conjunctiva/sclera: Conjunctivae normal.     Pupils: Pupils are equal, round, and reactive to light.  Neck:     Vascular: No carotid bruit.  Cardiovascular:     Rate and Rhythm: Normal rate and regular rhythm.     Heart sounds: Normal heart sounds. No murmur heard.    No gallop.  Pulmonary:     Effort: Pulmonary effort is normal. No accessory muscle usage or respiratory distress.     Breath sounds: Normal breath sounds.  Abdominal:     General: Bowel sounds are normal.     Palpations: Abdomen is soft.  Musculoskeletal:     Cervical back:  Normal range of motion and neck supple.     Right lower leg: No edema.  Left lower leg: No edema.  Skin:    General: Skin is warm and dry.  Neurological:     Mental Status: She is alert and oriented to person, place, and time.  Psychiatric:        Attention and Perception: Attention normal.        Mood and Affect: Mood normal.        Speech: Speech normal.        Behavior: Behavior normal. Behavior is cooperative.        Thought Content: Thought content normal.     Results for orders placed or performed in visit on 05/15/22  [id number] 11+Oxyco+Alc+Crt-Bund  Result Value Ref Range   Ethanol Negative Cutoff=0.020 %   Amphetamines, Urine Negative Cutoff=1000 ng/mL   Barbiturate Negative Cutoff=200 ng/mL   BENZODIAZ UR QL Negative Cutoff=200 ng/mL   Cannabinoid Quant, Ur Negative Cutoff=50 ng/mL   Cocaine (Metabolite) Negative Cutoff=300 ng/mL   OPIATE SCREEN URINE Negative Cutoff=300 ng/mL   Oxycodone/Oxymorphone, Urine Negative Cutoff=300 ng/mL   Phencyclidine Negative Cutoff=25 ng/mL   Methadone Screen, Urine Negative Cutoff=300 ng/mL   Propoxyphene Negative Cutoff=300 ng/mL   Meperidine Negative Cutoff=200 ng/mL   Tramadol Negative Cutoff=200 ng/mL   Creatinine 69.9 20.0 - 300.0 mg/dL   pH, Urine 6.4 4.5 - 8.9  CBC with Differential/Platelet  Result Value Ref Range   WBC 6.0 3.4 - 10.8 x10E3/uL   RBC 3.85 3.77 - 5.28 x10E6/uL   Hemoglobin 12.3 11.1 - 15.9 g/dL   Hematocrit 95.6 38.7 - 46.6 %   MCV 96 79 - 97 fL   MCH 31.9 26.6 - 33.0 pg   MCHC 33.3 31.5 - 35.7 g/dL   RDW 56.4 33.2 - 95.1 %   Platelets 316 150 - 450 x10E3/uL   Neutrophils 43 Not Estab. %   Lymphs 48 Not Estab. %   Monocytes 6 Not Estab. %   Eos 2 Not Estab. %   Basos 1 Not Estab. %   Neutrophils Absolute 2.6 1.4 - 7.0 x10E3/uL   Lymphocytes Absolute 2.9 0.7 - 3.1 x10E3/uL   Monocytes Absolute 0.4 0.1 - 0.9 x10E3/uL   EOS (ABSOLUTE) 0.1 0.0 - 0.4 x10E3/uL   Basophils Absolute 0.1 0.0 - 0.2 x10E3/uL    Immature Granulocytes 0 Not Estab. %   Immature Grans (Abs) 0.0 0.0 - 0.1 x10E3/uL  Comprehensive metabolic panel  Result Value Ref Range   Glucose 71 70 - 99 mg/dL   BUN 12 8 - 27 mg/dL   Creatinine, Ser 8.84 0.57 - 1.00 mg/dL   eGFR 71 >16 SA/YTK/1.60   BUN/Creatinine Ratio 14 12 - 28   Sodium 142 134 - 144 mmol/L   Potassium 4.8 3.5 - 5.2 mmol/L   Chloride 101 96 - 106 mmol/L   CO2 25 20 - 29 mmol/L   Calcium 9.8 8.7 - 10.3 mg/dL   Total Protein 6.5 6.0 - 8.5 g/dL   Albumin 4.1 3.8 - 4.8 g/dL   Globulin, Total 2.4 1.5 - 4.5 g/dL   Albumin/Globulin Ratio 1.7 1.2 - 2.2   Bilirubin Total <0.2 0.0 - 1.2 mg/dL   Alkaline Phosphatase 63 44 - 121 IU/L   AST 23 0 - 40 IU/L   ALT 11 0 - 32 IU/L  VITAMIN D 25 Hydroxy (Vit-D Deficiency, Fractures)  Result Value Ref Range   Vit D, 25-Hydroxy 80.3 30.0 - 100.0 ng/mL  Lipid Panel w/o Chol/HDL Ratio  Result Value Ref Range   Cholesterol, Total 161 100 - 199  mg/dL   Triglycerides 86 0 - 149 mg/dL   HDL 76 >16 mg/dL   VLDL Cholesterol Cal 16 5 - 40 mg/dL   LDL Chol Calc (NIH) 69 0 - 99 mg/dL  TSH  Result Value Ref Range   TSH 1.160 0.450 - 4.500 uIU/mL      Assessment & Plan:   Problem List Items Addressed This Visit       Other   Elevated BP without diagnosis of hypertension - Primary    Noted today at visit, with trend down one recheck to her baseline.  She will focus on diet changes, DASH, and regular activity.  Check BP 3 days a week at home and bring log to visit.  If ongoing elevations next visit will start ARB, avoid ACE due to smoking, educated her on this medication category and side effects.  She is agreeable to this plan.  Recommend cut back on smoking.      Generalized anxiety disorder    Chronic, stable.  Has reduced use of Xanax -- using lowest dose possible for her.  Only taking Xanax 0.25 MG at night 5 days a week.  Have sent in refill 0.25 MG QHS #90 tablets with 0 refills.  Had discussion about BEERS criteria  and risks with these.   Will continue to monitor, she is up to date on contract and UDS due 05/15/23.  Continue Lexapro.  She agrees to every 3 month visits, will return in 3 months.      Relevant Medications   ALPRAZolam (XANAX) 0.25 MG tablet (Start on 11/20/2022)   Hyperlipidemia    Chronic, ongoing.  Continue current medication regimen and adjust as needed.  Lipid panel today.      Relevant Orders   Comprehensive metabolic panel   Lipid Panel w/o Chol/HDL Ratio   Long-term current use of benzodiazepine    Refer to anxiety plan of care.      Nicotine dependence, cigarettes, uncomplicated    I have recommended complete cessation of tobacco use. I have discussed various options available for assistance with tobacco cessation including over the counter methods (Nicotine gum, patch and lozenges). We also discussed prescription options (Chantix, Nicotine Inhaler / Nasal Spray). The patient is not interested in pursuing any prescription tobacco cessation options at this time.  Does not want to obtain lung cancer screening.       Other Visit Diagnoses     Need for influenza vaccination       Flu vaccine provided in office today, discussed with patient.   Relevant Orders   Flu Vaccine Trivalent High Dose (Fluad) (Completed)        Follow up plan: Return in about 3 months (around 02/12/2023) for MOOD.

## 2022-11-13 NOTE — Assessment & Plan Note (Signed)
Noted today at visit, with trend down one recheck to her baseline.  She will focus on diet changes, DASH, and regular activity.  Check BP 3 days a week at home and bring log to visit.  If ongoing elevations next visit will start ARB, avoid ACE due to smoking, educated her on this medication category and side effects.  She is agreeable to this plan.  Recommend cut back on smoking.

## 2022-11-13 NOTE — Assessment & Plan Note (Signed)
Refer to anxiety plan of care. 

## 2022-11-13 NOTE — Assessment & Plan Note (Signed)
Chronic, ongoing.  Continue current medication regimen and adjust as needed. Lipid panel today. 

## 2022-11-14 LAB — LIPID PANEL W/O CHOL/HDL RATIO
Cholesterol, Total: 149 mg/dL (ref 100–199)
HDL: 70 mg/dL (ref >39)
LDL Chol Calc (NIH): 58 mg/dL (ref 0–99)
Triglycerides: 119 mg/dL (ref 0–149)
VLDL Cholesterol Cal: 21 mg/dL (ref 5–40)

## 2022-11-14 LAB — COMPREHENSIVE METABOLIC PANEL
ALT: 13 IU/L (ref 0–32)
AST: 22 IU/L (ref 0–40)
Albumin: 4.3 g/dL (ref 3.8–4.8)
Alkaline Phosphatase: 52 IU/L (ref 44–121)
BUN/Creatinine Ratio: 16 (ref 12–28)
BUN: 15 mg/dL (ref 8–27)
Bilirubin Total: <0.2 mg/dL (ref 0.0–1.2)
CO2: 26 mmol/L (ref 20–29)
Calcium: 9.7 mg/dL (ref 8.7–10.3)
Chloride: 103 mmol/L (ref 96–106)
Creatinine, Ser: 0.93 mg/dL (ref 0.57–1.00)
Globulin, Total: 1.9 g/dL (ref 1.5–4.5)
Glucose: 117 mg/dL — ABNORMAL HIGH (ref 70–99)
Potassium: 4.2 mmol/L (ref 3.5–5.2)
Sodium: 141 mmol/L (ref 134–144)
Total Protein: 6.2 g/dL (ref 6.0–8.5)
eGFR: 65 mL/min/{1.73_m2} (ref >59)

## 2022-11-14 NOTE — Progress Notes (Signed)
Contacted via MyChart   Good morning Brandy Parker, your labs have returned and overall are stable.  Glucose, sugar, a little elevated but if you had recently ate this would explain elevation.  Any questions?  No medication changes needed. Keep being amazing!!  Thank you for allowing me to participate in your care.  I appreciate you. Kindest regards, Kelcy Laible

## 2023-01-15 ENCOUNTER — Other Ambulatory Visit: Payer: Self-pay | Admitting: Nurse Practitioner

## 2023-01-15 DIAGNOSIS — Z1231 Encounter for screening mammogram for malignant neoplasm of breast: Secondary | ICD-10-CM

## 2023-02-06 NOTE — Patient Instructions (Signed)
 Managing Anxiety, Adult  After being diagnosed with anxiety, you may be relieved to know why you have felt or behaved a certain way. You may also feel overwhelmed about the treatment ahead and what it will mean for your life. With care and support, you can manage your anxiety.  How to manage lifestyle changes  Understanding the difference between stress and anxiety  Although stress can play a role in anxiety, it is not the same as anxiety. Stress is your body's reaction to life changes and events, both good and bad. Stress is often caused by something external, such as a deadline, test, or competition. It normally goes away after the event has ended and will last just a few hours. But, stress can be ongoing and can lead to more than just stress.  Anxiety is caused by something internal, such as imagining a terrible outcome or worrying that something will go wrong that will greatly upset you. Anxiety often does not go away even after the event is over, and it can become a long-term (chronic) worry.  Lowering stress and anxiety    Talk with your health care provider or a counselor to learn more about lowering anxiety and stress. They may suggest tension-reduction techniques, such as:  Music. Spend time creating or listening to music that you enjoy and that inspires you.  Mindfulness-based meditation. Practice being aware of your normal breaths while not trying to control your breathing. It can be done while sitting or walking.  Centering prayer. Focus on a word, phrase, or sacred image that means something to you and brings you peace.  Deep breathing. Expand your stomach and inhale slowly through your nose. Hold your breath for 3-5 seconds. Then breathe out slowly, letting your stomach muscles relax.  Self-talk. Learn to notice and spot thought patterns that lead to anxiety reactions. Change those patterns to thoughts that feel peaceful.  Muscle relaxation. Take time to tense muscles and then relax them.  Choose a  tension-reduction technique that fits your lifestyle and personality. These techniques take time and practice. Set aside 5-15 minutes a day to do them. Specialized therapists can offer counseling and training in these techniques. The training to help with anxiety may be covered by some insurance plans.  Other things you can do to manage stress and anxiety include:  Keeping a stress diary. This can help you learn what triggers your reaction and then learn ways to manage your response.  Thinking about how you react to certain situations. You may not be able to control everything, but you can control your response.  Making time for activities that help you relax and not feeling guilty about spending your time in this way.  Doing visual imagery. This involves imagining or creating mental pictures to help you relax.  Practicing yoga. Through yoga poses, you can lower tension and relax.     Medicines  Medicines for anxiety include:  Antidepressant medicines. These are usually prescribed for long-term daily control.  Anti-anxiety medicines. These may be added in severe cases, especially when panic attacks occur.  When used together, medicines, psychotherapy, and tension-reduction techniques may be the most effective treatment.  Relationships  Relationships can play a big part in helping you recover. Spend more time connecting with trusted friends and family members. Think about going to couples counseling if you have a partner, taking family education classes, or going to family therapy. Therapy can help you and others better understand your anxiety.  How to recognize changes in  your anxiety  Everyone responds differently to treatment for anxiety. Recovery from anxiety happens when symptoms lessen and stop interfering with your daily life at home or work. This may mean that you will start to:  Have better concentration and focus. Worry will interfere less in your daily thinking.  Sleep better.  Be less irritable.  Have  more energy.  Have improved memory.  Try to recognize when your condition is getting worse. Contact your provider if your symptoms interfere with home or work and you feel like your condition is not improving.  Follow these instructions at home:  Activity  Exercise. Adults should:  Exercise for at least 150 minutes each week. The exercise should increase your heart rate and make you sweat (moderate-intensity exercise).  Do strengthening exercises at least twice a week.  Get the right amount and quality of sleep. Most adults need 7-9 hours of sleep each night.  Lifestyle    Eat a healthy diet that includes plenty of vegetables, fruits, whole grains, low-fat dairy products, and lean protein.  Do not eat a lot of foods that are high in fats, added sugars, or salt (sodium).  Make choices that simplify your life.  Do not use any products that contain nicotine or tobacco. These products include cigarettes, chewing tobacco, and vaping devices, such as e-cigarettes. If you need help quitting, ask your provider.  Avoid caffeine, alcohol, and certain over-the-counter cold medicines. These may make you feel worse. Ask your pharmacist which medicines to avoid.  General instructions  Take over-the-counter and prescription medicines only as told by your provider.  Keep all follow-up visits. This is to make sure you are managing your anxiety well or if you need more support.  Where to find support  You can get help and support from:  Self-help groups.  Online and [employer].  A trusted spiritual leader.  Couples counseling.  Family education classes.  Family therapy.  Where to find more information  You may find that joining a support group helps you deal with your anxiety. The following sources can help you find counselors or support groups near you:  Mental Health America: mentalhealthamerica.net  Anxiety and Depression Association of [place] (ADAA): adaa.org  [employer] Mental [employer]ness (NAMI):  nami.org  Contact a health care provider if:  You have a hard time staying focused or finishing tasks.  You spend many hours a day feeling worried about everyday life.  You are very tired because you cannot stop worrying.  You start to have headaches or often feel tense.  You have chronic nausea or diarrhea.  Get help right away if:  Your heart feels like it is racing.  You have shortness of breath.  You have thoughts of hurting yourself or others.  Get help right away if you feel like you may hurt yourself or others, or have thoughts about taking your own life. Go to your nearest emergency room or:  Call 911.  Call the National Suicide Prevention Lifeline at 417-380-0019 or 988. This is open 24 hours a day.  Text the Crisis Text Line at 4151481703.  This information is not intended to replace advice given to you by your health care provider. Make sure you discuss any questions you have with your health care provider.  Document Revised: 12/02/2021 Document Reviewed: 06/16/2020  Elsevier Patient Education  2024 [employer].

## 2023-02-12 ENCOUNTER — Encounter: Payer: Self-pay | Admitting: Nurse Practitioner

## 2023-02-12 ENCOUNTER — Ambulatory Visit (INDEPENDENT_AMBULATORY_CARE_PROVIDER_SITE_OTHER): Payer: PPO | Admitting: Nurse Practitioner

## 2023-02-12 VITALS — BP 128/70 | HR 75 | Temp 98.5°F | Ht 61.81 in | Wt 101.2 lb

## 2023-02-12 DIAGNOSIS — F17210 Nicotine dependence, cigarettes, uncomplicated: Secondary | ICD-10-CM

## 2023-02-12 DIAGNOSIS — F411 Generalized anxiety disorder: Secondary | ICD-10-CM | POA: Diagnosis not present

## 2023-02-12 DIAGNOSIS — Z79899 Other long term (current) drug therapy: Secondary | ICD-10-CM

## 2023-02-12 NOTE — Assessment & Plan Note (Signed)
I have recommended complete cessation of tobacco use. I have discussed various options available for assistance with tobacco cessation including over the counter methods (Nicotine gum, patch and lozenges). We also discussed prescription options (Chantix, Nicotine Inhaler / Nasal Spray). The patient is not interested in pursuing any prescription tobacco cessation options at this time.  Does not want to obtain lung cancer screening.

## 2023-02-12 NOTE — Assessment & Plan Note (Signed)
Refer to anxiety plan of care. 

## 2023-02-12 NOTE — Progress Notes (Signed)
.cfpb   BP 128/70 (BP Location: Left Arm, Patient Position: Sitting, Cuff Size: Normal)   Pulse 75   Temp 98.5 F (36.9 C) (Oral)   Ht 5' 1.81" (1.57 m)   Wt 101 lb 3.2 oz (45.9 kg)   SpO2 98%   BMI 18.62 kg/m    Subjective:    Patient ID: Brandy Parker, female    DOB: Oct 18, 1950, 72 y.o.   MRN: [id number]  HPI: Brandy Parker is a 72 y.o. female  Chief Complaint  Patient presents with   3 month follow up    mood    States everything is fine , no new concerns today    ANXIETY/STRESS Taking Xanax 0.25 MG PRN (5 nights a week) and Lexapro 10 MG daily.   Been on Xanax for a long while per her report, since 2008  Pt is aware of risks of benzo medication use to include increased sedation, respiratory suppression, falls, dependence and cardiovascular events. Pt would like to continue treatment as benefit determined to outweigh risk.  On PDMP review her last fill Xanax was 01/29/23 for 90 day supply. Previously got 90 day supply from past provider.  Continues to smoke cigarettes. Duration:stable Anxious mood: no  Excessive worrying: no Irritability: no  Sweating: no Nausea: no Palpitations:no Hyperventilation: no Panic attacks: no Agoraphobia: no  Obscessions/compulsions: no Depressed mood: no    02/12/2023    1:19 PM 11/13/2022    1:07 PM 06/01/2022    1:31 PM 05/15/2022    1:15 PM 02/13/2022    1:27 PM  Depression screen PHQ 2/9  Decreased Interest 0 0 0 0 0  Down, Depressed, Hopeless 0 0 0 0 0  PHQ - 2 Score 0 0 0 0 0  Altered sleeping 0 0 0 0 0  Tired, decreased energy 0 0 0 0 0  Change in appetite 0 0 0 0 0  Feeling bad or failure about yourself  0 0 0 0 0  Trouble concentrating 0 0 0 0 0  Moving slowly or fidgety/restless 0 0 0 0 0  Suicidal thoughts 0 0 0 0 0  PHQ-9 Score 0 0 0 0 0  Difficult doing work/chores  Not difficult at all Not difficult at all Not difficult at all Not difficult at all      02/12/2023    1:19 PM 11/13/2022    1:08 PM 05/15/2022    1:15 PM  02/13/2022    1:27 PM  GAD 7 : Generalized Anxiety Score  Nervous, Anxious, on Edge 0 0 0 0  Control/stop worrying 0 0 0 0  Worry too much - different things 0 0 0 0  Trouble relaxing 0 0 0 0  Restless 0 0 0 0  Easily annoyed or irritable 0 0 0 0  Afraid - awful might happen 0 0 0 0  Total GAD 7 Score 0 0 0 0  Anxiety Difficulty  Not difficult at all Not difficult at all Not difficult at all   Relevant past medical, surgical, family and social history reviewed and updated as indicated. Interim medical history since our last visit reviewed. Allergies and medications reviewed and updated.  Review of Systems  Constitutional:  Negative for activity change, appetite change, diaphoresis, fatigue and fever.  HENT:  Negative for ear discharge and ear pain.   Respiratory:  Negative for cough, chest tightness and shortness of breath.   Cardiovascular:  Negative for chest pain, palpitations and leg swelling.  Gastrointestinal: Negative.  Neurological: Negative.   Psychiatric/Behavioral:  Negative for decreased concentration, self-injury, sleep disturbance and suicidal ideas. The patient is not nervous/anxious.     Per HPI unless specifically indicated above     Objective:    BP 128/70 (BP Location: Left Arm, Patient Position: Sitting, Cuff Size: Normal)   Pulse 75   Temp 98.5 F (36.9 C) (Oral)   Ht 5' 1.81" (1.57 m)   Wt 101 lb 3.2 oz (45.9 kg)   SpO2 98%   BMI 18.62 kg/m   Wt Readings from Last 3 Encounters:  02/12/23 101 lb 3.2 oz (45.9 kg)  11/13/22 102 lb 3.2 oz (46.4 kg)  08/21/22 105 lb 12.8 oz (48 kg)    Physical Exam Vitals and nursing note reviewed.  Constitutional:      General: She is awake. She is not in acute distress.    Appearance: She is well-developed and well-groomed. She is not ill-appearing.  HENT:     Head: Normocephalic.     Right Ear: Hearing and external ear normal.     Left Ear: Hearing and external ear normal.  Eyes:     General: Lids are normal.         Right eye: No discharge.        Left eye: No discharge.     Conjunctiva/sclera: Conjunctivae normal.     Pupils: Pupils are equal, round, and reactive to light.  Neck:     Vascular: No carotid bruit.  Cardiovascular:     Rate and Rhythm: Normal rate and regular rhythm.     Heart sounds: Normal heart sounds. No murmur heard.    No gallop.  Pulmonary:     Effort: Pulmonary effort is normal. No accessory muscle usage or respiratory distress.     Breath sounds: Normal breath sounds.  Abdominal:     General: Bowel sounds are normal.     Palpations: Abdomen is soft.  Musculoskeletal:     Cervical back: Normal range of motion and neck supple.     Right lower leg: No edema.     Left lower leg: No edema.  Skin:    General: Skin is warm and dry.  Neurological:     Mental Status: She is alert and oriented to person, place, and time.  Psychiatric:        Attention and Perception: Attention normal.        Mood and Affect: Mood normal.        Speech: Speech normal.        Behavior: Behavior normal. Behavior is cooperative.        Thought Content: Thought content normal.    Results for orders placed or performed in visit on 11/13/22  Comprehensive metabolic panel  Result Value Ref Range   Glucose 117 (H) 70 - 99 mg/dL   BUN 15 8 - 27 mg/dL   Creatinine, Ser 8.41 0.57 - 1.00 mg/dL   eGFR 65 >32 GM/WNU/2.72   BUN/Creatinine Ratio 16 12 - 28   Sodium 141 134 - 144 mmol/L   Potassium 4.2 3.5 - 5.2 mmol/L   Chloride 103 96 - 106 mmol/L   CO2 26 20 - 29 mmol/L   Calcium 9.7 8.7 - 10.3 mg/dL   Total Protein 6.2 6.0 - 8.5 g/dL   Albumin 4.3 3.8 - 4.8 g/dL   Globulin, Total 1.9 1.5 - 4.5 g/dL   Bilirubin Total <5.3 0.0 - 1.2 mg/dL   Alkaline Phosphatase 52 44 - 121 IU/L  AST 22 0 - 40 IU/L   ALT 13 0 - 32 IU/L  Lipid Panel w/o Chol/HDL Ratio  Result Value Ref Range   Cholesterol, Total 149 100 - 199 mg/dL   Triglycerides 956 0 - 149 mg/dL   HDL 70 >21 mg/dL   VLDL  Cholesterol Cal 21 5 - 40 mg/dL   LDL Chol Calc (NIH) 58 0 - 99 mg/dL      Assessment & Plan:   Problem List Items Addressed This Visit       Other   Generalized anxiety disorder - Primary    Chronic, stable.  Only taking Xanax 0.25 MG at night 5 days a week.  Have sent in refill 0.25 MG QHS #90 tablets with 0 refills recently.  Had discussion about BEERS criteria and risks with these.   Will continue to monitor, she is up to date on contract and UDS due 05/15/23.  Continue Lexapro.  She agrees to every 3 month visits, will return in 3 months.      Long-term current use of benzodiazepine    Refer to anxiety plan of care.      Nicotine dependence, cigarettes, uncomplicated    I have recommended complete cessation of tobacco use. I have discussed various options available for assistance with tobacco cessation including over the counter methods (Nicotine gum, patch and lozenges). We also discussed prescription options (Chantix, Nicotine Inhaler / Nasal Spray). The patient is not interested in pursuing any prescription tobacco cessation options at this time.  Does not want to obtain lung cancer screening.          Follow up plan: Return in about 3 months (around 05/13/2023) for Annual Physical -- due after 05/15/23.

## 2023-02-12 NOTE — Assessment & Plan Note (Signed)
Chronic, stable.  Only taking Xanax 0.25 MG at night 5 days a week.  Have sent in refill 0.25 MG QHS #90 tablets with 0 refills recently.  Had discussion about BEERS criteria and risks with these.   Will continue to monitor, she is up to date on contract and UDS due 05/15/23.  Continue Lexapro.  She agrees to every 3 month visits, will return in 3 months.

## 2023-02-16 ENCOUNTER — Ambulatory Visit
Admission: RE | Admit: 2023-02-16 | Discharge: 2023-02-16 | Disposition: A | Discharge status: Home / Self Care | Payer: PPO | Source: Ambulatory Visit | Attending: Nurse Practitioner | Admitting: Nurse Practitioner

## 2023-02-16 DIAGNOSIS — Z1231 Encounter for screening mammogram for malignant neoplasm of breast: Secondary | ICD-10-CM | POA: Insufficient documentation

## 2023-02-18 NOTE — Progress Notes (Signed)
Contacted via MyChart   Normal mammogram, may repeat in one year:)

## 2023-04-19 ENCOUNTER — Emergency Department: Payer: PPO

## 2023-04-19 ENCOUNTER — Inpatient Hospital Stay
Admission: EM | Admit: 2023-04-19 | Discharge: 2023-04-26 | DRG: 853 | Disposition: A | Discharge status: Home / Self Care | Payer: PPO | Attending: Surgery | Admitting: Surgery

## 2023-04-19 ENCOUNTER — Other Ambulatory Visit: Payer: Self-pay

## 2023-04-19 ENCOUNTER — Ambulatory Visit: Payer: Self-pay

## 2023-04-19 DIAGNOSIS — Z79899 Other long term (current) drug therapy: Secondary | ICD-10-CM

## 2023-04-19 DIAGNOSIS — A419 Sepsis, unspecified organism: Principal | ICD-10-CM | POA: Diagnosis present

## 2023-04-19 DIAGNOSIS — E785 Hyperlipidemia, unspecified: Secondary | ICD-10-CM | POA: Diagnosis present

## 2023-04-19 DIAGNOSIS — K219 Gastro-esophageal reflux disease without esophagitis: Secondary | ICD-10-CM | POA: Diagnosis present

## 2023-04-19 DIAGNOSIS — Z8249 Family history of ischemic heart disease and other diseases of the circulatory system: Secondary | ICD-10-CM

## 2023-04-19 DIAGNOSIS — F17210 Nicotine dependence, cigarettes, uncomplicated: Secondary | ICD-10-CM | POA: Diagnosis present

## 2023-04-19 DIAGNOSIS — K3532 Acute appendicitis with perforation and localized peritonitis, without abscess: Secondary | ICD-10-CM | POA: Diagnosis present

## 2023-04-19 DIAGNOSIS — N179 Acute kidney failure, unspecified: Secondary | ICD-10-CM | POA: Diagnosis present

## 2023-04-19 DIAGNOSIS — R1030 Lower abdominal pain, unspecified: Principal | ICD-10-CM

## 2023-04-19 DIAGNOSIS — Z9049 Acquired absence of other specified parts of digestive tract: Secondary | ICD-10-CM | POA: Diagnosis present

## 2023-04-19 DIAGNOSIS — K3580 Unspecified acute appendicitis: Secondary | ICD-10-CM | POA: Diagnosis present

## 2023-04-19 DIAGNOSIS — R1031 Right lower quadrant pain: Secondary | ICD-10-CM | POA: Diagnosis not present

## 2023-04-19 DIAGNOSIS — Z882 Allergy status to sulfonamides status: Secondary | ICD-10-CM

## 2023-04-19 DIAGNOSIS — E860 Dehydration: Secondary | ICD-10-CM

## 2023-04-19 DIAGNOSIS — I700 Atherosclerosis of aorta: Secondary | ICD-10-CM | POA: Diagnosis present

## 2023-04-19 DIAGNOSIS — K5900 Constipation, unspecified: Secondary | ICD-10-CM | POA: Diagnosis present

## 2023-04-19 DIAGNOSIS — K3533 Acute appendicitis with perforation and localized peritonitis, with abscess: Principal | ICD-10-CM | POA: Diagnosis present

## 2023-04-19 LAB — URINALYSIS, ROUTINE W REFLEX MICROSCOPIC
Bacteria, UA: NONE SEEN
Bilirubin Urine: NEGATIVE
Glucose, UA: 50 mg/dL — AB
Hgb urine dipstick: NEGATIVE
Ketones, ur: 5 mg/dL — AB
Leukocytes,Ua: NEGATIVE
Nitrite: NEGATIVE
Protein, ur: 30 mg/dL — AB
Specific Gravity, Urine: 1.019 (ref 1.005–1.030)
pH: 5 (ref 5.0–8.0)

## 2023-04-19 LAB — CBC WITH DIFFERENTIAL/PLATELET
Abs Immature Granulocytes: 0.11 10*3/uL — ABNORMAL HIGH (ref 0.00–0.07)
Basophils Absolute: 0 10*3/uL (ref 0.0–0.1)
Basophils Relative: 0 %
Eosinophils Absolute: 0.1 10*3/uL (ref 0.0–0.5)
Eosinophils Relative: 0 %
HCT: 39.2 % (ref 36.0–46.0)
Hemoglobin: 13.4 g/dL (ref 12.0–15.0)
Immature Granulocytes: 1 %
Lymphocytes Relative: 5 %
Lymphs Abs: 0.9 10*3/uL (ref 0.7–4.0)
MCH: 31.5 pg (ref 26.0–34.0)
MCHC: 34.2 g/dL (ref 30.0–36.0)
MCV: 92.2 fL (ref 80.0–100.0)
Monocytes Absolute: 0.4 10*3/uL (ref 0.1–1.0)
Monocytes Relative: 2 %
Neutro Abs: 16.4 10*3/uL — ABNORMAL HIGH (ref 1.7–7.7)
Neutrophils Relative %: 92 %
Platelets: 344 10*3/uL (ref 150–400)
RBC: 4.25 MIL/uL (ref 3.87–5.11)
RDW: 13.4 % (ref 11.5–15.5)
WBC: 17.9 10*3/uL — ABNORMAL HIGH (ref 4.0–10.5)
nRBC: 0 % (ref 0.0–0.2)

## 2023-04-19 LAB — COMPREHENSIVE METABOLIC PANEL
ALT: 12 U/L (ref 0–44)
AST: 24 U/L (ref 15–41)
Albumin: 3.9 g/dL (ref 3.5–5.0)
Alkaline Phosphatase: 39 U/L (ref 38–126)
Anion gap: 16 — ABNORMAL HIGH (ref 5–15)
BUN: 30 mg/dL — ABNORMAL HIGH (ref 8–23)
CO2: 24 mmol/L (ref 22–32)
Calcium: 9.3 mg/dL (ref 8.9–10.3)
Chloride: 97 mmol/L — ABNORMAL LOW (ref 98–111)
Creatinine, Ser: 1.37 mg/dL — ABNORMAL HIGH (ref 0.44–1.00)
GFR, Estimated: 41 mL/min — ABNORMAL LOW (ref >60)
Glucose, Bld: 94 mg/dL (ref 70–99)
Potassium: 4.6 mmol/L (ref 3.5–5.1)
Sodium: 137 mmol/L (ref 135–145)
Total Bilirubin: 0.6 mg/dL (ref 0.0–1.2)
Total Protein: 7.2 g/dL (ref 6.5–8.1)

## 2023-04-19 MED ORDER — IOHEXOL 300 MG/ML  SOLN
100.0000 mL | Freq: Once | INTRAMUSCULAR | Status: AC | PRN
  Administered 2023-04-19: 100 mL via INTRAVENOUS

## 2023-04-19 MED ORDER — SODIUM CHLORIDE 0.9 % IV BOLUS
1000.0000 mL | Freq: Once | INTRAVENOUS | Status: AC
  Administered 2023-04-19: 1000 mL via INTRAVENOUS

## 2023-04-19 NOTE — ED Triage Notes (Signed)
 Pt c/o lower abdominal pain since yesterday. Pt says she has not had a BM since yesterday. Pt denies n/v. Pt reports pain is worse with any movement. Pt denies any abdominal surgical hx.

## 2023-04-19 NOTE — ED Provider Triage Note (Signed)
 Emergency Medicine Provider Triage Evaluation Note  Brandy Parker , a 73 y.o. female  was evaluated in triage.  Pt complains of lower abdominal pain since yesterday.  Review of Systems  Positive: Lower abdominal pain Negative: N/v  Physical Exam  BP 96/69   Pulse (!) 112   Temp 99.2 F (37.3 C) (Oral)   Resp 18   Ht 5\' 1"  (1.549 m)   Wt 45.8 kg   SpO2 97%   BMI 19.08 kg/m  Gen:   Awake, no distress   Resp:  Normal effort  MSK:   Moves extremities without difficulty  Other:    Medical Decision Making  Medically screening exam initiated at 5:20 PM.  Appropriate orders placed.  Brandy Parker was informed that the remainder of the evaluation will be completed by another provider, this initial triage assessment does not replace that evaluation, and the importance of remaining in the ED until their evaluation is complete.     Phyliss Breen, PA-C 04/19/23 1723

## 2023-04-19 NOTE — Telephone Encounter (Signed)
  Chief Complaint: Extreme abdominal pain. Cannot touch lower abdomen Symptoms: pain Frequency: yesterday and also had last week , but had resolved Pertinent Negatives: Patient denies  Disposition: [x] ED /[] Urgent Care (no appt availability in office) / [] Appointment(In office/virtual)/ []  [facility]/ [] Home Care/ [] Refused Recommended Disposition /[] [facility]/ []  Follow-up with PCP Additional Notes: Pt has lower middle abdominal pain. The only time it does not hurt is when she stand still. She cannot touch her abdomen. Pt had a similar pain last week that resolved. Pt will call EMS for transport to ED.    Reason for Disposition  [1] SEVERE pain AND [2] age > 60 years  Answer Assessment - Initial Assessment Questions 1. LOCATION: "Where does it hurt?"      Lower middle 2. RADIATION: "Does the pain shoot anywhere else?" (e.g., chest, back)     no 3. ONSET: "When did the pain begin?" (e.g., minutes, hours or days ago)      yesterday 4. SUDDEN: "Gradual or sudden onset?"     gradual 5. PATTERN "Does the pain come and go, or is it constant?"    - If it comes and goes: "How long does it last?" "Do you have pain now?"     (Note: Comes and goes means the pain is intermittent. It goes away completely between bouts.)    - If constant: "Is it getting better, staying the same, or getting worse?"      (Note: Constant means the pain never goes away completely; most serious pain is constant and gets worse.)      Constant - getting worse - 6. SEVERITY: "How bad is the pain?"  (e.g., Scale 1-10; mild, moderate, or severe)    - MILD (1-3): Doesn't interfere with normal activities, abdomen soft and not tender to touch.     - MODERATE (4-7): Interferes with normal activities or awakens from sleep, abdomen tender to touch.     - SEVERE (8-10): Excruciating pain, doubled over, unable to do any normal activities.       severe 7. RECURRENT SYMPTOM: "Have you ever had this type of  stomach pain before?" If Yes, ask: "When was the last time?" and "What happened that time?"      Last week  9. RELIEVING/AGGRAVATING FACTORS: "What makes it better or worse?" (e.g., antacids, bending or twisting motion, bowel movement)     Standing up still doesn't hurt 10. OTHER SYMPTOMS: "Do you have any other symptoms?" (e.g., back pain, diarrhea, fever, urination pain, vomiting)       no  Protocols used: Abdominal Pain - Female-A-AH

## 2023-04-19 NOTE — ED Provider Notes (Signed)
 [facility] Provider Note    Event Date/Time   First MD Initiated Contact with Patient 04/19/23 2204     (approximate)   History   Abdominal Pain   HPI  Brandy Parker is a 73 y.o. female with a history of elevated blood pressure and anxiety who presents with lower abdominal pain, acute onset yesterday, mainly in the lower abdomen in the midline, and not associated with any vomiting or diarrhea.  The patient denies any urinary symptoms.  She states that she also had the pain last week but it resolved after less than a day.  She denies any fever or chills.  I reviewed the past medical records.  The patient has no recent ED visits or admissions.  Her most recent outpatient encounter was with primary care on 12/6 for follow-up of her chronic conditions.   Physical Exam   Triage Vital Signs: ED Triage Vitals  Encounter Vitals Group     BP 04/19/23 1713 96/69     Systolic BP Percentile --      Diastolic BP Percentile --      Pulse Rate 04/19/23 1713 (!) 112     Resp 04/19/23 1713 18     Temp 04/19/23 1713 99.2 F (37.3 C)     Temp Source 04/19/23 1713 Oral     SpO2 04/19/23 1713 97 %     Weight 04/19/23 1714 101 lb (45.8 kg)     Height 04/19/23 1714 5\' 1"  (1.549 m)     Head Circumference --      Peak Flow --      Pain Score 04/19/23 1714 8     Pain Loc --      Pain Education --      Exclude from Growth Chart --     Most recent vital signs: Vitals:   04/19/23 1713  BP: 96/69  Pulse: (!) 112  Resp: 18  Temp: 99.2 F (37.3 C)  SpO2: 97%     General: Awake, no distress.  CV:  Good peripheral perfusion.  Resp:  Normal effort.  Abd:  No distention.  Moderate suprapubic and left lower quadrant tenderness.  No peritoneal signs. Other:  No jaundice or scleral icterus.  Very dry mucous membranes.   ED Results / Procedures / Treatments   Labs (all labs ordered are listed, but only abnormal results are displayed) Labs Reviewed  CBC WITH  DIFFERENTIAL/PLATELET - Abnormal; Notable for the following components:      Result Value   WBC 17.9 (*)    Neutro Abs 16.4 (*)    Abs Immature Granulocytes 0.11 (*)    All other components within normal limits  COMPREHENSIVE METABOLIC PANEL - Abnormal; Notable for the following components:   Chloride 97 (*)    BUN 30 (*)    Creatinine, Ser 1.37 (*)    GFR, Estimated 41 (*)    Anion gap 16 (*)    All other components within normal limits  URINALYSIS, ROUTINE W REFLEX MICROSCOPIC - Abnormal; Notable for the following components:   Color, Urine AMBER (*)    APPearance HAZY (*)    Glucose, UA 50 (*)    Ketones, ur 5 (*)    Protein, ur 30 (*)    All other components within normal limits  LIPASE, BLOOD     EKG     RADIOLOGY  CT abdomen/pelvis: Pending   PROCEDURES:  Critical Care performed: No  Procedures   MEDICATIONS ORDERED IN  ED: Medications  sodium chloride  0.9 % bolus 1,000 mL (1,000 mLs Intravenous New Bag/Given 04/19/23 2245)  iohexol  (OMNIPAQUE ) 300 MG/ML solution 100 mL (100 mLs Intravenous Contrast Given 04/19/23 2301)     IMPRESSION / MDM / ASSESSMENT AND PLAN / ED COURSE  I reviewed the triage vital signs and the nursing notes.  73 year old female with PMH as noted above presents with midline lower abdominal pain since yesterday with no significant associated symptoms.  On exam her blood pressure is borderline low.  Her temperature is borderline elevated and she is tachycardic.  She has moderate tenderness in the lower abdomen along the midline and left lower quadrant.  Mucous membranes are dry.  Differential diagnosis includes, but is not limited to, diverticulitis, colitis, UTI/cystitis, SBO, volvulus.  Initial lab workup is significant for elevated WBC count and anion gap.  Urinalysis is unremarkable.  The patient clinically appears quite dehydrated.  We will give fluids and obtain CT abdomen/pelvis for further evaluation.  Patient's presentation is  most consistent with acute presentation with potential threat to life or bodily function.  ----------------------------------------- 11:20 PM on 04/19/2023 -----------------------------------------  CT is pending.  I have signed the patient out to the oncoming ED physician.  FINAL CLINICAL IMPRESSION(S) / ED DIAGNOSES   Final diagnoses:  Lower abdominal pain     Rx / DC Orders   ED Discharge Orders     None        Note:  This document was prepared using Dragon voice recognition software and may include unintentional dictation errors.    Lind Repine, MD 04/19/23 2320

## 2023-04-19 NOTE — ED Triage Notes (Signed)
 First nurse note: Pt to ED via [facility] from home. Pt reports lower abd pain. Tender upon palpation. Ems reports mild distention. Lips are dry    HR 125 126/86 97%

## 2023-04-20 ENCOUNTER — Observation Stay: Payer: PPO | Admitting: Anesthesiology

## 2023-04-20 ENCOUNTER — Encounter: Payer: Self-pay | Admitting: Surgery

## 2023-04-20 ENCOUNTER — Encounter
Admission: EM | Disposition: A | Discharge status: Home / Self Care | Payer: Self-pay | Source: Home / Self Care | Attending: Surgery

## 2023-04-20 DIAGNOSIS — Z8249 Family history of ischemic heart disease and other diseases of the circulatory system: Secondary | ICD-10-CM | POA: Diagnosis not present

## 2023-04-20 DIAGNOSIS — E785 Hyperlipidemia, unspecified: Secondary | ICD-10-CM | POA: Diagnosis present

## 2023-04-20 DIAGNOSIS — F17210 Nicotine dependence, cigarettes, uncomplicated: Secondary | ICD-10-CM | POA: Diagnosis present

## 2023-04-20 DIAGNOSIS — K3580 Unspecified acute appendicitis: Secondary | ICD-10-CM | POA: Diagnosis present

## 2023-04-20 DIAGNOSIS — K219 Gastro-esophageal reflux disease without esophagitis: Secondary | ICD-10-CM | POA: Diagnosis present

## 2023-04-20 DIAGNOSIS — Z79899 Other long term (current) drug therapy: Secondary | ICD-10-CM | POA: Diagnosis not present

## 2023-04-20 DIAGNOSIS — K5900 Constipation, unspecified: Secondary | ICD-10-CM | POA: Diagnosis present

## 2023-04-20 DIAGNOSIS — R1031 Right lower quadrant pain: Secondary | ICD-10-CM | POA: Diagnosis present

## 2023-04-20 DIAGNOSIS — N179 Acute kidney failure, unspecified: Secondary | ICD-10-CM | POA: Diagnosis present

## 2023-04-20 DIAGNOSIS — I700 Atherosclerosis of aorta: Secondary | ICD-10-CM | POA: Diagnosis present

## 2023-04-20 DIAGNOSIS — A419 Sepsis, unspecified organism: Secondary | ICD-10-CM | POA: Diagnosis present

## 2023-04-20 DIAGNOSIS — Z9049 Acquired absence of other specified parts of digestive tract: Secondary | ICD-10-CM | POA: Diagnosis present

## 2023-04-20 DIAGNOSIS — K3533 Acute appendicitis with perforation and localized peritonitis, with abscess: Secondary | ICD-10-CM | POA: Diagnosis present

## 2023-04-20 DIAGNOSIS — K3532 Acute appendicitis with perforation and localized peritonitis, without abscess: Secondary | ICD-10-CM | POA: Diagnosis present

## 2023-04-20 DIAGNOSIS — Z882 Allergy status to sulfonamides status: Secondary | ICD-10-CM | POA: Diagnosis not present

## 2023-04-20 HISTORY — PX: XI ROBOTIC LAPAROSCOPIC ASSISTED APPENDECTOMY: SHX6877

## 2023-04-20 LAB — LIPASE, BLOOD: Lipase: 29 U/L (ref 11–51)

## 2023-04-20 SURGERY — APPENDECTOMY, ROBOT-ASSISTED, LAPAROSCOPIC
Anesthesia: General

## 2023-04-20 MED ORDER — FENTANYL CITRATE (PF) 100 MCG/2ML IJ SOLN
INTRAMUSCULAR | Status: AC
  Filled 2023-04-20: qty 2

## 2023-04-20 MED ORDER — ESCITALOPRAM OXALATE 10 MG PO TABS
10.0000 mg | ORAL_TABLET | Freq: Every day | ORAL | Status: DC
  Administered 2023-04-20 – 2023-04-26 (×7): 10 mg via ORAL
  Filled 2023-04-20 (×7): qty 1

## 2023-04-20 MED ORDER — HYDROCODONE-ACETAMINOPHEN 5-325 MG PO TABS
1.0000 | ORAL_TABLET | ORAL | Status: DC | PRN
  Administered 2023-04-21 – 2023-04-25 (×2): 1 via ORAL
  Filled 2023-04-20 (×2): qty 1

## 2023-04-20 MED ORDER — DEXAMETHASONE SODIUM PHOSPHATE 10 MG/ML IJ SOLN
INTRAMUSCULAR | Status: DC | PRN
Start: 2023-04-20 — End: 2023-04-20
  Administered 2023-04-20: 10 mg via INTRAVENOUS

## 2023-04-20 MED ORDER — MORPHINE SULFATE (PF) 2 MG/ML IV SOLN
2.0000 mg | INTRAVENOUS | Status: DC | PRN
  Filled 2023-04-20: qty 1

## 2023-04-20 MED ORDER — LIDOCAINE-EPINEPHRINE (PF) 1 %-1:200000 IJ SOLN
INTRAMUSCULAR | Status: DC | PRN
  Administered 2023-04-20: 7.5 mL

## 2023-04-20 MED ORDER — PROPOFOL 10 MG/ML IV BOLUS
INTRAVENOUS | Status: DC | PRN
  Administered 2023-04-20: 80 mg via INTRAVENOUS

## 2023-04-20 MED ORDER — 0.9 % SODIUM CHLORIDE (POUR BTL) OPTIME
TOPICAL | Status: DC | PRN
  Administered 2023-04-20: 800 mL

## 2023-04-20 MED ORDER — PIPERACILLIN-TAZOBACTAM 3.375 G IVPB
3.3750 g | Freq: Three times a day (TID) | INTRAVENOUS | Status: AC
  Administered 2023-04-20 – 2023-04-24 (×15): 3.375 g via INTRAVENOUS
  Filled 2023-04-20 (×17): qty 50

## 2023-04-20 MED ORDER — SODIUM CHLORIDE 0.9 % IV SOLN
INTRAVENOUS | Status: DC | PRN
  Administered 2023-04-20: 50 mL

## 2023-04-20 MED ORDER — LACTATED RINGERS IV SOLN: INTRAVENOUS | Status: DC

## 2023-04-20 MED ORDER — DEXMEDETOMIDINE HCL IN NACL 80 MCG/20ML IV SOLN
INTRAVENOUS | Status: DC | PRN
  Administered 2023-04-20 (×2): 8 ug via INTRAVENOUS

## 2023-04-20 MED ORDER — BUPIVACAINE HCL (PF) 0.5 % IJ SOLN
INTRAMUSCULAR | Status: DC | PRN
  Administered 2023-04-20: 7.5 mL

## 2023-04-20 MED ORDER — ONDANSETRON HCL 4 MG/2ML IJ SOLN
INTRAMUSCULAR | Status: DC | PRN
  Administered 2023-04-20: 4 mg via INTRAVENOUS

## 2023-04-20 MED ORDER — ACETAMINOPHEN 10 MG/ML IV SOLN
INTRAVENOUS | Status: DC | PRN
  Administered 2023-04-20: 687 mg via INTRAVENOUS

## 2023-04-20 MED ORDER — ACETAMINOPHEN 10 MG/ML IV SOLN: 15.0000 mg/kg | Freq: Once | INTRAVENOUS | Status: DC | PRN

## 2023-04-20 MED ORDER — PROPOFOL 10 MG/ML IV BOLUS
INTRAVENOUS | Status: AC
  Filled 2023-04-20: qty 20

## 2023-04-20 MED ORDER — SODIUM CHLORIDE 0.9 % IV SOLN: INTRAVENOUS | Status: DC

## 2023-04-20 MED ORDER — BUPIVACAINE HCL (PF) 0.5 % IJ SOLN
INTRAMUSCULAR | Status: AC
  Filled 2023-04-20: qty 30

## 2023-04-20 MED ORDER — FENTANYL CITRATE (PF) 100 MCG/2ML IJ SOLN: 25.0000 ug | INTRAMUSCULAR | Status: DC | PRN

## 2023-04-20 MED ORDER — PHENYLEPHRINE 80 MCG/ML (10ML) SYRINGE FOR IV PUSH (FOR BLOOD PRESSURE SUPPORT)
PREFILLED_SYRINGE | INTRAVENOUS | Status: DC | PRN
  Administered 2023-04-20: 160 ug via INTRAVENOUS
  Administered 2023-04-20 (×2): 80 ug via INTRAVENOUS

## 2023-04-20 MED ORDER — ENOXAPARIN SODIUM 40 MG/0.4ML IJ SOSY
40.0000 mg | PREFILLED_SYRINGE | INTRAMUSCULAR | Status: DC
  Administered 2023-04-21 – 2023-04-26 (×6): 40 mg via SUBCUTANEOUS
  Filled 2023-04-20 (×6): qty 0.4

## 2023-04-20 MED ORDER — ONDANSETRON HCL 4 MG/2ML IJ SOLN: 4.0000 mg | Freq: Once | INTRAMUSCULAR | Status: DC | PRN

## 2023-04-20 MED ORDER — OXYCODONE HCL 5 MG/5ML PO SOLN: 5.0000 mg | Freq: Once | ORAL | Status: DC | PRN

## 2023-04-20 MED ORDER — FENTANYL CITRATE (PF) 100 MCG/2ML IJ SOLN
INTRAMUSCULAR | Status: DC | PRN
  Administered 2023-04-20 (×4): 50 ug via INTRAVENOUS

## 2023-04-20 MED ORDER — ROCURONIUM BROMIDE 10 MG/ML (PF) SYRINGE
PREFILLED_SYRINGE | INTRAVENOUS | Status: AC
  Filled 2023-04-20: qty 10

## 2023-04-20 MED ORDER — ROCURONIUM BROMIDE 100 MG/10ML IV SOLN
INTRAVENOUS | Status: DC | PRN
Start: 2023-04-20 — End: 2023-04-20
  Administered 2023-04-20 (×2): 10 mg via INTRAVENOUS
  Administered 2023-04-20: 30 mg via INTRAVENOUS

## 2023-04-20 MED ORDER — SUGAMMADEX SODIUM 200 MG/2ML IV SOLN
INTRAVENOUS | Status: DC | PRN
  Administered 2023-04-20: 200 mg via INTRAVENOUS

## 2023-04-20 MED ORDER — ONDANSETRON HCL 4 MG/2ML IJ SOLN
4.0000 mg | Freq: Four times a day (QID) | INTRAMUSCULAR | Status: DC | PRN
Start: 2023-04-20 — End: 2023-04-26
  Administered 2023-04-21: 4 mg via INTRAVENOUS
  Filled 2023-04-20: qty 2

## 2023-04-20 MED ORDER — ONDANSETRON HCL 4 MG/2ML IJ SOLN
INTRAMUSCULAR | Status: AC
  Filled 2023-04-20: qty 2

## 2023-04-20 MED ORDER — TRAMADOL HCL 50 MG PO TABS
50.0000 mg | ORAL_TABLET | Freq: Four times a day (QID) | ORAL | Status: DC | PRN
  Administered 2023-04-20 – 2023-04-22 (×3): 50 mg via ORAL
  Filled 2023-04-20 (×3): qty 1

## 2023-04-20 MED ORDER — OXYCODONE HCL 5 MG PO TABS: 5.0000 mg | ORAL_TABLET | Freq: Once | ORAL | Status: DC | PRN

## 2023-04-20 MED ORDER — ORAL CARE MOUTH RINSE: 15.0000 mL | OROMUCOSAL | Status: DC | PRN

## 2023-04-20 MED ORDER — PIPERACILLIN-TAZOBACTAM 3.375 G IVPB 30 MIN
3.3750 g | Freq: Once | INTRAVENOUS | Status: AC
  Administered 2023-04-20: 3.375 g via INTRAVENOUS
  Filled 2023-04-20 (×2): qty 50

## 2023-04-20 MED ORDER — ONDANSETRON 4 MG PO TBDP: 4.0000 mg | ORAL_TABLET | Freq: Four times a day (QID) | ORAL | Status: DC | PRN

## 2023-04-20 MED ORDER — LIDOCAINE-EPINEPHRINE (PF) 1 %-1:200000 IJ SOLN
INTRAMUSCULAR | Status: AC
  Filled 2023-04-20: qty 30

## 2023-04-20 MED ORDER — BUPIVACAINE LIPOSOME 1.3 % IJ SUSP
INTRAMUSCULAR | Status: AC
Start: 2023-04-20 — End: ?
  Filled 2023-04-20: qty 20

## 2023-04-20 MED ORDER — LIDOCAINE HCL (PF) 2 % IJ SOLN
INTRAMUSCULAR | Status: AC
  Filled 2023-04-20: qty 5

## 2023-04-20 MED ORDER — DEXMEDETOMIDINE HCL IN NACL 80 MCG/20ML IV SOLN
INTRAVENOUS | Status: AC
  Filled 2023-04-20: qty 20

## 2023-04-20 MED ORDER — ALPRAZOLAM 0.25 MG PO TABS
0.2500 mg | ORAL_TABLET | Freq: Every evening | ORAL | Status: DC | PRN
  Administered 2023-04-20 – 2023-04-25 (×6): 0.25 mg via ORAL
  Filled 2023-04-20 (×7): qty 1

## 2023-04-20 MED ORDER — ACETAMINOPHEN 10 MG/ML IV SOLN
INTRAVENOUS | Status: AC
  Filled 2023-04-20: qty 100

## 2023-04-20 MED ORDER — LIDOCAINE HCL (CARDIAC) PF 100 MG/5ML IV SOSY
PREFILLED_SYRINGE | INTRAVENOUS | Status: DC | PRN
  Administered 2023-04-20: 50 mg via INTRAVENOUS

## 2023-04-20 MED ORDER — DOCUSATE SODIUM 100 MG PO CAPS
100.0000 mg | ORAL_CAPSULE | Freq: Two times a day (BID) | ORAL | Status: DC | PRN
  Administered 2023-04-23: 100 mg via ORAL
  Filled 2023-04-20: qty 1

## 2023-04-20 SURGICAL SUPPLY — 77 items
ANCHOR TIS RET SYS 235ML (MISCELLANEOUS) ×1 IMPLANT
BAG PRESSURE INF REUSE 1000 (BAG) IMPLANT
BLADE SURG SZ10 CARB STEEL (BLADE) IMPLANT
COVER TIP SHEARS 8 DVNC (MISCELLANEOUS) ×1 IMPLANT
COVER WAND RF STERILE (DRAPES) ×1 IMPLANT
DERMABOND ADVANCED .7 DNX12 (GAUZE/BANDAGES/DRESSINGS) ×1 IMPLANT
DRAIN CHANNEL JP 15F RND 3/16 (MISCELLANEOUS) IMPLANT
DRAPE ARM DVNC X/XI (DISPOSABLE) ×3 IMPLANT
DRAPE COLUMN DVNC XI (DISPOSABLE) ×1 IMPLANT
DRESSING PEEL AND PLC PRVNA 13 (GAUZE/BANDAGES/DRESSINGS) IMPLANT
DRSG PEEL AND PLACE PREVENA 13 (GAUZE/BANDAGES/DRESSINGS) ×1 IMPLANT
DRSG TEGADERM 4X4.75 (GAUZE/BANDAGES/DRESSINGS) IMPLANT
ELECT REM PT RETURN 9FT ADLT (ELECTROSURGICAL) ×1 IMPLANT
ELECTRODE REM PT RTRN 9FT ADLT (ELECTROSURGICAL) ×1 IMPLANT
EVACUATOR DRAINAGE 7X20 100CC (MISCELLANEOUS) IMPLANT
EVACUATOR SILICONE 100CC (DRAIN) IMPLANT
FORCEPS BPLR FENES DVNC XI (FORCEP) ×1 IMPLANT
GAUZE SPONGE 4X4 12PLY STRL (GAUZE/BANDAGES/DRESSINGS) IMPLANT
GLOVE BIOGEL PI IND STRL 7.0 (GLOVE) ×2 IMPLANT
GLOVE SURG SYN 6.5 ES PF (GLOVE) ×4 IMPLANT
GLOVE SURG SYN 6.5 PF PI (GLOVE) ×2 IMPLANT
GOWN STRL REUS W/ TWL LRG LVL3 (GOWN DISPOSABLE) ×3 IMPLANT
GRASPER SUT TROCAR 14GX15 (MISCELLANEOUS) IMPLANT
HANDLE YANKAUER SUCT BULB TIP (MISCELLANEOUS) IMPLANT
HOLDER FOLEY CATH W/STRAP (MISCELLANEOUS) IMPLANT
IRRIGATOR SUCT 8 DISP DVNC XI (IRRIGATION / IRRIGATOR) IMPLANT
IV NS 1000ML BAXH (IV SOLUTION) IMPLANT
KIT TURNOVER KIT A (KITS) ×1 IMPLANT
LABEL OR SOLS (LABEL) IMPLANT
LIGASURE IMPACT 36 18CM CVD LR (INSTRUMENTS) IMPLANT
MANIFOLD NEPTUNE II (INSTRUMENTS) ×1 IMPLANT
NDL DRIVE SUT CUT DVNC (INSTRUMENTS) IMPLANT
NDL HYPO 22X1.5 SAFETY MO (MISCELLANEOUS) ×1 IMPLANT
NDL INSUFFLATION 14GA 120MM (NEEDLE) ×1 IMPLANT
NEEDLE DRIVE SUT CUT DVNC (INSTRUMENTS) ×1 IMPLANT
NEEDLE HYPO 22X1.5 SAFETY MO (MISCELLANEOUS) ×1 IMPLANT
NEEDLE INSUFFLATION 14GA 120MM (NEEDLE) ×1 IMPLANT
OBTURATOR OPTICAL STND 8 DVNC (TROCAR) ×1 IMPLANT
OBTURATOR OPTICALSTD 8 DVNC (TROCAR) ×1 IMPLANT
PACK LAP CHOLECYSTECTOMY (MISCELLANEOUS) ×1 IMPLANT
RELOAD PROXIMATE 75MM BLUE (ENDOMECHANICALS) ×2 IMPLANT
RELOAD STAPLE 45 2.5 WHT DVNC (STAPLE) IMPLANT
RELOAD STAPLE 45 3.5 BLU DVNC (STAPLE) IMPLANT
RELOAD STAPLE 75 3.8 BLU REG (ENDOMECHANICALS) IMPLANT
RELOAD STAPLER 2.5X45 WHT DVNC (STAPLE) IMPLANT
RELOAD STAPLER 3.5X45 BLU DVNC (STAPLE) ×3 IMPLANT
RETRACTOR WOUND ALXS 18CM MED (MISCELLANEOUS) IMPLANT
RTRCTR WOUND ALEXIS O 18CM MED (MISCELLANEOUS) ×1 IMPLANT
SCISSORS MNPLR CVD DVNC XI (INSTRUMENTS) ×1 IMPLANT
SEAL UNIV 5-12 XI (MISCELLANEOUS) ×3 IMPLANT
SEALER VESSEL EXT DVNC XI (MISCELLANEOUS) IMPLANT
SET TUBE SMOKE EVAC HIGH FLOW (TUBING) ×1 IMPLANT
SLEEVE SCD COMPRESS KNEE MED (STOCKING) IMPLANT
SOL ELECTROSURG ANTI STICK (MISCELLANEOUS) ×1 IMPLANT
SOLUTION ELECTROSURG ANTI STCK (MISCELLANEOUS) ×1 IMPLANT
SPIKE FLUID TRANSFER (MISCELLANEOUS) IMPLANT
SPONGE T-LAP 18X18 ~~LOC~~+RFID (SPONGE) IMPLANT
STAPLER 45 SUREFORM DVNC (STAPLE) IMPLANT
STAPLER GUN LINEAR PROX 60 (STAPLE) IMPLANT
STAPLER PROXIMATE 75MM BLUE (STAPLE) IMPLANT
STAPLER RELOAD 2.5X45 WHT DVNC (STAPLE) IMPLANT
STAPLER RELOAD 3.5X45 BLU DVNC (STAPLE) ×3 IMPLANT
STAPLER SKIN PROX 35W (STAPLE) IMPLANT
SUT ETHILON 3-0 FS-10 30 BLK (SUTURE) ×1 IMPLANT
SUT MNCRL AB 4-0 PS2 18 (SUTURE) ×1 IMPLANT
SUT PDS AB 1 CT1 36 (SUTURE) IMPLANT
SUT SILK 3 0 SH 30 (SUTURE) IMPLANT
SUT VIC AB 3-0 SH 27X BRD (SUTURE) IMPLANT
SUT VICRYL 0 UR6 27IN ABS (SUTURE) ×1 IMPLANT
SUTURE EHLN 3-0 FS-10 30 BLK (SUTURE) IMPLANT
SYR 30ML LL (SYRINGE) ×1 IMPLANT
SYR BULB IRRIG 60ML STRL (SYRINGE) IMPLANT
SYSTEM WECK SHIELD CLOSURE (TROCAR) IMPLANT
TOWEL OR 17X26 4PK STRL BLUE (TOWEL DISPOSABLE) IMPLANT
TRAP FLUID SMOKE EVACUATOR (MISCELLANEOUS) ×1 IMPLANT
TRAY FOLEY MTR SLVR 16FR STAT (SET/KITS/TRAYS/PACK) ×1 IMPLANT
WATER STERILE IRR 500ML POUR (IV SOLUTION) ×1 IMPLANT

## 2023-04-20 NOTE — Op Note (Signed)
Preoperative diagnosis: acute appendicitis  Postoperative diagnosis: Perforated appendicitis  Procedure: Robotic assisted laparoscopic appendectomy, open ileocecectomy  Anesthesia: GETA  Surgeon: Sung Amabile  Wound Classification: clean contaminated  Specimen: Appendix  Complications: None  Estimated Blood Loss: 3 mL   Indications: Patient is a 73 y.o. female  presented with above.  Please see H&P for further details.    FIndings: 1.  Extensive fibrinous buildup and purulent drainage with specks of feculent particles consistent with perforation 2.  Adjacent abscess and phlegmon 3. Normal anatomy otherwise 4. Appendiceal artery ligated and divided with stapler 5.  Ileocecectomy performed due to appendix to me staple line concerns for ischemia 6. Adequate hemostasis  Description of procedure: The patient was placed on the operating table in the supine position, both arms tucked. General anesthesia was induced. A time-out was completed verifying correct patient, procedure, site, positioning, and implant(s) and/or special equipment prior to beginning this procedure. The abdomen was prepped and draped in the usual sterile fashion.   Palmer's point located and Veress needle was inserted.  After confirming 2 clicks and a positive saline drop test, gas insufflation was initiated until the abdominal pressure was measured at 15 mmHg.  Afterwards, the Veress needle was removed and a 8 mm port was placed through same site using Optiview technique after incision with an 11 blade.  After local was infused, 2 additional incision was made 8 cm apart on each side of original port.  An 8 mm port to the left and 12mm port to the right from initial incision, both under direct visualization.  No injuries from trocar placements were noted. The table was placed in the Trendelenburg position with the right side elevated.  Xi robotic platform was then brought to the operative field and docked.  Extensive  fibrinous buildup and purulent drainage with specks of feculent particles consistent with perforation. An inflamed appendix was identified and elevated. A small perforation noted near base around severely inflammed tissue. Infection was present within the abdominal cavity due to appendicitis and purulent drainage. Window created at base of appendix in the mesentery.   A blue load linear cutting stapler was then used to divide and staple the base of the appendix. It was reloaded with a blue load cartridge and the mesoappendix similarly divided.  No bleeding from the staple lines noted.  Abdominal cavity then extensively irrigated ensuring visible pockets of purulent fluid was suctioned completely.  Repeat exam of the staple line unfortunately noted darkening discoloration at the cecal base concerning for ischemia the possibility of the staple line not holding.  Decision made at this point to convert to a open ileal cecostomy to prevent issues with the staple line.  Lateral line of Toldt dissected robotically to allow adequate mobilization of the right colon and terminal ileum in preparation for the ileocecectomy.  Robot undocked and.  Umbilical incision created and dissection carried down to fascia.  Fascia was entered, wound protector placed, and the appendix was removed out of the abdominal cavity pending pathology.  Right colon and terminal ileum easily pulled through the midline incision.  Point on distal ileum chosen for resection using GIA 75 mm blue load stapler.  LigaSure then used to transect the mesentery along the terminal ileum and cecum to a point at the right colon, where another 75 mm blue load stapler was used to transect.  Terminal ileum and cecum then removed out of operative field pending pathology.  2 staple events able to line up without any twisting  and a side-to-side anastomosis created.  Corners of the 2 staple lines removed and a 75 mm blue load stapler inserted and a side-to-side  anastomosis created.  3-0 silk x 2 used to reinforce the corners of the anastomosis staple line prior to closing the staple enterotomy with a TA blue load stapler.  All visible staples then reinforced using interrupted 3-0 silk in a Lembert fashion for additional reinforcement.  Mesenteric defect then approximated using running 3-0 Vicryl.  Minimal spillage noted during the creation of the anastomosis and the 2 ends grossly looked viable.    15 French round drain placed through the left lower quadrant former port site and placed on top of the anastomosis secured to skin using 3-0 nylon.  Wound protector removed and Exparel infused along the midline incision prior to closing with 1 PDS x 2.  Midline skin then approximated using staples.  2 remaining port sites approximated using 4-0 Monocryl.  Prevena wound VAC placed to the midline incision and Dermabond used to dress the port sites.  Gauze and Tegaderm used to secure the drain site.  The patient tolerated the procedure well, awakened from anesthesia and was taken to the postanesthesia care unit in satisfactory condition.  Sponge count and instrument count correct at the end of the procedure.  Foley remains in place.

## 2023-04-20 NOTE — ED Provider Notes (Signed)
-----------------------------------------   12:00 AM on 04/20/2023 -----------------------------------------   CT abdomen/pelvis interpreted per Dr. Tessie Fass:  1. Non-perforated acute appendicitis with associated appendicoliths.  Associated trace volume right lower quadrant ascites with associated  pleural enhancement suggestive of superimposed infection.  2. Question developing organized fluid collection within the right  pelvis measuring 3.7 x 1.5 cm. Differential diagnosis includes  fluid-filled adjacent small bowel.  3. Colonic diverticulosis with no acute diverticulitis.  4.  Aortic Atherosclerosis (ICD10-I70.0).   Updated patient on CT imaging result.  Have ordered IV Zosyn.  Will discuss case with general surgery.   Irean Hong, MD 04/20/23 [phone]

## 2023-04-20 NOTE — H&P (Signed)
Subjective:   CC: acute appendicitis  HPI:  Brandy Parker is a 73 y.o. female who is consulted by Dolores Frame for evaluation of  above cc.  Symptoms were first noted 1 day ago. Pain is sharp, lower midline abdomen.  Associated with nothing, exacerbated by nothing.     Past Medical History:  has a past medical history of Anxiety and Hyperlipemia.  Past Surgical History:  has a past surgical history that includes ORIF ankle fracture (Left, 07/20/2016).  Family History: family history includes Cancer in her father and sister; Heart failure in her father and mother; Hyperlipidemia in her father; Hypertension in her father and mother.  Social History:  reports that she has been smoking cigarettes. She has never used smokeless tobacco. She reports that she does not drink alcohol and does not use drugs.  Current Medications:  Prior to Admission medications   Medication Sig Start Date End Date Taking? Authorizing Provider  ALPRAZolam (XANAX) 0.25 MG tablet Take 1 tablet (0.25 mg total) by mouth at bedtime as needed for anxiety. 11/20/22   Cannady, Corrie Dandy T, NP  cholecalciferol (VITAMIN D3) 25 MCG (1000 UNIT) tablet Take 1,000 Units by mouth daily.    [provider]  escitalopram (LEXAPRO) 10 MG tablet Take 1 tablet (10 mg total) by mouth daily. 05/15/22   Aura Dials T, NP  Multiple Vitamin (MULTIVITAMIN) tablet Take 1 tablet by mouth daily.    [provider]  simvastatin (ZOCOR) 20 MG tablet Take 1 tablet (20 mg total) by mouth daily at 6 PM. 05/15/22   Marjie Skiff, NP    Allergies:  Allergies as of 04/19/2023 - Review Complete 04/19/2023  Allergen Reaction Noted   Sulfa antibiotics Other (See Comments) 07/17/2016    ROS:  General: Denies weight loss, weight gain, fatigue, fevers, chills, and night sweats. Eyes: Denies blurry vision, double vision, eye pain, itchy eyes, and tearing. Ears: Denies hearing loss, earache, and ringing in ears. Nose: Denies sinus pain,  congestion, infections, runny nose, and nosebleeds. Mouth/throat: Denies hoarseness, sore throat, bleeding gums, and difficulty swallowing. Heart: Denies chest pain, palpitations, racing heart, irregular heartbeat, leg pain or swelling, and decreased activity tolerance. Respiratory: Denies breathing difficulty, shortness of breath, wheezing, cough, and sputum. GI: Denies change in appetite, heartburn, nausea, vomiting, constipation, diarrhea, and blood in stool. GU: Denies difficulty urinating, pain with urinating, urgency, frequency, blood in urine. Musculoskeletal: Denies joint stiffness, pain, swelling, muscle weakness. Skin: Denies rash, itching, mass, tumors, sores, and boils Neurologic: Denies headache, fainting, dizziness, seizures, numbness, and tingling. Psychiatric: Denies depression, anxiety, difficulty sleeping, and memory loss. Endocrine: Denies heat or cold intolerance, and increased thirst or urination. Blood/lymph: Denies easy bruising, easy bruising, and swollen glands     Objective:     BP 137/70   Pulse 96   Temp 99.1 F (37.3 C) (Oral)   Resp 16   Ht 5\' 1"  (1.549 m)   Wt 45.8 kg   SpO2 98%   BMI 19.08 kg/m    Constitutional :  alert, cooperative, appears stated age, and no distress  Lymphatics/Throat:  no asymmetry, masses, or scars  Respiratory:  clear to auscultation bilaterally  Cardiovascular:  regular rate and rhythm  Gastrointestinal: Soft, no guarding, suprapubic TTP .   Musculoskeletal: Steady gait and movement  Skin: Cool and moist  Psychiatric: Normal affect, non-agitated, not confused       LABS:     Latest Ref Rng & Units 04/19/2023    5:16 PM 11/13/2022  1:28 PM 05/15/2022    1:39 PM  CMP  Glucose 70 - 99 mg/dL 94  409  71   BUN 8 - 23 mg/dL 30  15  12    Creatinine 0.44 - 1.00 mg/dL 8.11  9.14  7.82   Sodium 135 - 145 mmol/L 137  141  142   Potassium 3.5 - 5.1 mmol/L 4.6  4.2  4.8   Chloride 98 - 111 mmol/L 97  103  101   CO2 22 - 32  mmol/L 24  26  25    Calcium 8.9 - 10.3 mg/dL 9.3  9.7  9.8   Total Protein 6.5 - 8.1 g/dL 7.2  6.2  6.5   Total Bilirubin 0.0 - 1.2 mg/dL 0.6  <9.5  <6.2   Alkaline Phos 38 - 126 U/L 39  52  63   AST 15 - 41 U/L 24  22  23    ALT 0 - 44 U/L 12  13  11        Latest Ref Rng & Units 04/19/2023    5:16 PM 05/15/2022    1:39 PM 05/07/2021    2:00 PM  CBC  WBC 4.0 - 10.5 K/uL 17.9  6.0  6.0   Hemoglobin 12.0 - 15.0 g/dL 13.0  86.5  78.4   Hematocrit 36.0 - 46.0 % 39.2  36.9  38.2   Platelets 150 - 400 K/uL 344  316  330      RADS: CLINICAL DATA:  LLQ abdominal pain RLQ abdominal pain   EXAM: CT ABDOMEN AND PELVIS WITH CONTRAST   TECHNIQUE: Multidetector CT imaging of the abdomen and pelvis was performed using the standard protocol following bolus administration of intravenous contrast.   RADIATION DOSE REDUCTION: This exam was performed according to the departmental dose-optimization program which includes automated exposure control, adjustment of the mA and/or kV according to patient size and/or use of iterative reconstruction technique.   CONTRAST:  OMNIPAQUE IOHEXOL 300 MG/ML  SOLN   COMPARISON:  None Available.   FINDINGS: Lower chest: Coronary artery calcification. Aortic valve leaflet calcification.   Hepatobiliary: No focal liver abnormality. No gallstones, gallbladder wall thickening, or pericholecystic fluid. No biliary dilatation.   Pancreas: Diffusely atrophic. No focal lesion. Otherwise normal pancreatic contour. No surrounding inflammatory changes. No main pancreatic ductal dilatation.   Spleen: Normal in size without focal abnormality.   Adrenals/Urinary Tract:   No adrenal nodule bilaterally.   Bilateral kidneys enhance symmetrically.   No hydronephrosis. No hydroureter.   The urinary bladder is unremarkable.   On delayed imaging, there is no urothelial wall thickening and there are no filling defects in the opacified portions of the  bilateral collecting systems or ureters.   Stomach/Bowel: Stomach is within normal limits. No evidence of bowel wall thickening or dilatation. Colonic diverticulosis. A bed in seal caliber enlarged measuring up to 1.8 cm with associated edematous and thickened appendiceal walls. Lumen filled with fluid and multiple appendicolith. No discontinuity of the appendiceal wall. Associated right lower quadrant abdominal free fluid with pleural enhancement.   Vascular/Lymphatic: No abdominal aorta or iliac aneurysm. Severe atherosclerotic plaque of the aorta and its branches. No abdominal, pelvic, or inguinal lymphadenopathy.   Reproductive: Status post hysterectomy versus atrophic uterus. No adnexal mass.   Other: Trace simple free fluid within the pelvis with associated pleural enhancement. No intraperitoneal free gas. Question developing organized fluid collection within the right pelvis measuring 3.7 x 1.5 cm. (5:42, 6:39).   Musculoskeletal:   No  abdominal wall hernia or abnormality.   No suspicious lytic or blastic osseous lesions. No acute displaced fracture. Multilevel degenerative changes of the spine.   IMPRESSION: 1. Non-perforated acute appendicitis with associated appendicoliths. Associated trace volume right lower quadrant ascites with associated pleural enhancement suggestive of superimposed infection. 2. Question developing organized fluid collection within the right pelvis measuring 3.7 x 1.5 cm. Differential diagnosis includes fluid-filled adjacent small bowel. 3. Colonic diverticulosis with no acute diverticulitis. 4.  Aortic Atherosclerosis (ICD10-I70.0).     Electronically Signed   By: Tish Frederickson M.D.   On: 04/19/2023 23:45   Assessment:      Acute appendicitis AKI  Plan:      Discussed the risk of surgery including post-op infxn, seroma, hematoma, abscess formation, chronic pain, poor-delayed wound healing, possible bowel resection, possible  ostomy, possible conversion to open procedure, post-op SBO or ileus, and need for additional procedures to address said risks.  The risks of general anesthetic including MI, CVA, sudden death or even reaction to anesthetic medications also discussed. Alternatives include continued observation, or antibiotic treatment.  Benefits include possible symptom relief,   Typical post operative recovery of 3-5 days rest, also discussed.  The patient understands the risks, any and all questions were answered to the patient's satisfaction.  To OR for robotic lap appy.  IV abx and fluids in the meantime  labs/images/medications/previous chart entries reviewed personally and relevant changes/updates noted above.

## 2023-04-20 NOTE — Progress Notes (Signed)
Pt able to tolerate ice chips.

## 2023-04-20 NOTE — Transfer of Care (Signed)
Immediate Anesthesia Transfer of Care Note  Patient: Brandy Parker  Procedure(s) Performed: XI ROBOTIC LAPAROSCOPIC ASSISTED APPENDECTOMY, OPEN ILEOCECECTOMY  Patient Location: PACU  Anesthesia Type:General  Level of Consciousness: drowsy  Airway & Oxygen Therapy: Patient Spontanous Breathing and Patient connected to face mask oxygen  Post-op Assessment: Report given to RN and Post -op Vital signs reviewed and stable  Post vital signs: Reviewed and stable  Last Vitals:  Vitals Value Taken Time  BP 163/68 04/20/23 0450  Temp    Pulse 81 04/20/23 0451  Resp 14 04/20/23 0451  SpO2 100 % 04/20/23 0451  Vitals shown include unfiled device data.  Last Pain:  Vitals:   04/20/23 0041  TempSrc: Oral  PainSc: 0-No pain      Patients Stated Pain Goal: 0 (04/20/23 0041)  Complications: No notable events documented.

## 2023-04-20 NOTE — ED Notes (Signed)
Pt arrives to 18H without PIV in place unknown reason for no PIV in place as CT order with contrast writer RN places PIV to [facility] pt tolerated well patent secured not infiltrated 20g

## 2023-04-20 NOTE — Anesthesia Preprocedure Evaluation (Addendum)
Anesthesia Evaluation  Patient identified by MRN, date of birth, ID band Patient awake    Reviewed: Allergy & Precautions, NPO status , Patient's Chart, lab work & pertinent test results  History of Anesthesia Complications Negative for: history of anesthetic complications  Airway Mallampati: III   Neck ROM: Full    Dental no notable dental hx.    Pulmonary Current Smoker (6-7 cigarettes per day) and Patient abstained from smoking.   Pulmonary exam normal breath sounds clear to auscultation       Cardiovascular negative cardio ROS Normal cardiovascular exam Rhythm:Regular Rate:Normal     Neuro/Psych  PSYCHIATRIC DISORDERS Anxiety     negative neurological ROS     GI/Hepatic ,GERD  ,,  Endo/Other  negative endocrine ROS    Renal/GU negative Renal ROS     Musculoskeletal   Abdominal   Peds  Hematology negative hematology ROS (+)   Anesthesia Other Findings   Reproductive/Obstetrics                             Anesthesia Physical Anesthesia Plan  ASA: 2 and emergent  Anesthesia Plan: General   Post-op Pain Management:    Induction: Intravenous  PONV Risk Score and Plan: 2 and Ondansetron, Dexamethasone and Treatment may vary due to age or medical condition  Airway Management Planned: Oral ETT  Additional Equipment:   Intra-op Plan:   Post-operative Plan: Extubation in OR  Informed Consent: I have reviewed the patients History and Physical, chart, labs and discussed the procedure including the risks, benefits and alternatives for the proposed anesthesia with the patient or authorized representative who has indicated his/her understanding and acceptance.     Dental advisory given  Plan Discussed with: CRNA  Anesthesia Plan Comments: (Patient consented for risks of anesthesia including but not limited to:  - adverse reactions to medications - damage to eyes, teeth, lips or  other oral mucosa - nerve damage due to positioning  - sore throat or hoarseness - damage to heart, brain, nerves, lungs, other parts of body or loss of life  Informed patient about role of CRNA in peri- and intra-operative care.  Patient voiced understanding.)        Anesthesia Quick Evaluation

## 2023-04-20 NOTE — Anesthesia Postprocedure Evaluation (Signed)
Anesthesia Post Note  Patient: Brandy Parker  Procedure(s) Performed: XI ROBOTIC LAPAROSCOPIC ASSISTED APPENDECTOMY, OPEN ILEOCECECTOMY  Patient location during evaluation: PACU Anesthesia Type: General Level of consciousness: awake and alert, oriented and patient cooperative Pain management: pain level controlled Vital Signs Assessment: post-procedure vital signs reviewed and stable Respiratory status: spontaneous breathing, nonlabored ventilation and respiratory function stable Cardiovascular status: blood pressure returned to baseline and stable Postop Assessment: adequate PO intake Anesthetic complications: no   No notable events documented.   Last Vitals:  Vitals:   04/20/23 0526 04/20/23 0600  BP: (!) 164/79 (!) 167/74  Pulse: 93 90  Resp: (!) 26 20  Temp: 37.2 C 36.9 C  SpO2: 96% 95%    Last Pain:  Vitals:   04/20/23 0600  TempSrc: Oral  PainSc:                  Reed Breech

## 2023-04-20 NOTE — Anesthesia Procedure Notes (Signed)
Procedure Name: Intubation Date/Time: 04/20/2023 2:12 AM  Performed by: Hezzie Bump, CRNAPre-anesthesia Checklist: Patient identified, Patient being monitored, Timeout performed, Emergency Drugs available and Suction available Patient Re-evaluated:Patient Re-evaluated prior to induction Oxygen Delivery Method: Circle system utilized Preoxygenation: Pre-oxygenation with 100% oxygen Induction Type: IV induction Ventilation: Mask ventilation without difficulty Laryngoscope Size: 3 and McGrath Grade View: Grade I Tube type: Oral Tube size: 6.5 mm Number of attempts: 1 Airway Equipment and Method: Stylet Placement Confirmation: ETT inserted through vocal cords under direct vision, positive ETCO2 and breath sounds checked- equal and bilateral Secured at: 18 cm Tube secured with: Tape Dental Injury: Teeth and Oropharynx as per pre-operative assessment

## 2023-04-20 NOTE — Telephone Encounter (Signed)
FYI

## 2023-04-20 NOTE — ED Notes (Signed)
Transport arrives to transport pt to OR

## 2023-04-20 NOTE — ED Notes (Signed)
Pt provided non skid socks pt reports needing to use restroom

## 2023-04-20 NOTE — ED Notes (Signed)
Report to [facility] OR staff pt requires gown and consent pt [id number] Charge Research scientist (life sciences) to change pt out to prepare pt for OR for OR staff

## 2023-04-20 NOTE — Plan of Care (Signed)

## 2023-04-21 ENCOUNTER — Encounter: Payer: Self-pay | Admitting: Surgery

## 2023-04-21 LAB — CBC
HCT: 32.7 % — ABNORMAL LOW (ref 36.0–46.0)
Hemoglobin: 10.8 g/dL — ABNORMAL LOW (ref 12.0–15.0)
MCH: 31.7 pg (ref 26.0–34.0)
MCHC: 33 g/dL (ref 30.0–36.0)
MCV: 95.9 fL (ref 80.0–100.0)
Platelets: 287 10*3/uL (ref 150–400)
RBC: 3.41 MIL/uL — ABNORMAL LOW (ref 3.87–5.11)
RDW: 13.7 % (ref 11.5–15.5)
WBC: 14.4 10*3/uL — ABNORMAL HIGH (ref 4.0–10.5)
nRBC: 0 % (ref 0.0–0.2)

## 2023-04-21 LAB — BASIC METABOLIC PANEL
Anion gap: 10 (ref 5–15)
BUN: 26 mg/dL — ABNORMAL HIGH (ref 8–23)
CO2: 20 mmol/L — ABNORMAL LOW (ref 22–32)
Calcium: 8.1 mg/dL — ABNORMAL LOW (ref 8.9–10.3)
Chloride: 109 mmol/L (ref 98–111)
Creatinine, Ser: 0.8 mg/dL (ref 0.44–1.00)
GFR, Estimated: >60 mL/min (ref >60)
Glucose, Bld: 68 mg/dL — ABNORMAL LOW (ref 70–99)
Potassium: 4 mmol/L (ref 3.5–5.1)
Sodium: 139 mmol/L (ref 135–145)

## 2023-04-21 MED ORDER — KCL IN DEXTROSE-NACL 40-5-0.45 MEQ/L-%-% IV SOLN
INTRAVENOUS | Status: DC
  Filled 2023-04-21 (×2): qty 1000

## 2023-04-21 NOTE — Progress Notes (Signed)
Subjective:  CC: Brandy Parker is a 73 y.o. female  Hospital stay day 1, 1 Day Post-Op robo lap appy to open ileocecectomy for perforated appendicitis  HPI: No acute issues overnight.  No flatus or BM.  ROS:  General: Denies weight loss, weight gain, fatigue, fevers, chills, and night sweats. Heart: Denies chest pain, palpitations, racing heart, irregular heartbeat, leg pain or swelling, and decreased activity tolerance. Respiratory: Denies breathing difficulty, shortness of breath, wheezing, cough, and sputum. GI: Denies change in appetite, heartburn, nausea, vomiting, constipation, diarrhea, and blood in stool. GU: Denies difficulty urinating, pain with urinating, urgency, frequency, blood in urine.   Objective:   Temp:  [98 F (36.7 C)-98.4 F (36.9 C)] 98.3 F (36.8 C) (02/12 0201) Pulse Rate:  [77-94] 89 (02/12 0201) Resp:  [17-20] 17 (02/12 0201) BP: (135-165)/(65-79) 157/79 (02/12 0201) SpO2:  [95 %-100 %] 95 % (02/12 0201)     Height: 5\' 1"  (154.9 cm) Weight: 45.8 kg BMI (Calculated): 19.09   Intake/Output this shift:   Intake/Output Summary (Last 24 hours) at 04/21/2023 0759 Last data filed at 04/21/2023 4098 Gross per 24 hour  Intake 163.05 ml  Output 643 ml  Net -479.95 ml    Constitutional :  alert, cooperative, appears stated age, and no distress  Respiratory:  clear to auscultation bilaterally  Cardiovascular:  regular rate and rhythm  Gastrointestinal: Soft, no guarding, prevena intact, JP serous. TTP around incision as expected .   Skin: Cool and moist. Port site incisions c/d/i  Psychiatric: Normal affect, non-agitated, not confused       LABS:     Latest Ref Rng & Units 04/21/2023    6:24 AM 04/19/2023    5:16 PM 11/13/2022    1:28 PM  CMP  Glucose 70 - 99 mg/dL 68  94  119   BUN 8 - 23 mg/dL 26  30  15    Creatinine 0.44 - 1.00 mg/dL 1.47  8.29  5.62   Sodium 135 - 145 mmol/L 139  137  141   Potassium 3.5 - 5.1 mmol/L 4.0  4.6  4.2   Chloride 98 -  111 mmol/L 109  97  103   CO2 22 - 32 mmol/L 20  24  26    Calcium 8.9 - 10.3 mg/dL 8.1  9.3  9.7   Total Protein 6.5 - 8.1 g/dL  7.2  6.2   Total Bilirubin 0.0 - 1.2 mg/dL  0.6  <1.3   Alkaline Phos 38 - 126 U/L  39  52   AST 15 - 41 U/L  24  22   ALT 0 - 44 U/L  12  13       Latest Ref Rng & Units 04/21/2023    6:24 AM 04/19/2023    5:16 PM 05/15/2022    1:39 PM  CBC  WBC 4.0 - 10.5 K/uL 14.4  17.9  6.0   Hemoglobin 12.0 - 15.0 g/dL 08.6  57.8  46.9   Hematocrit 36.0 - 46.0 % 32.7  39.2  36.9   Platelets 150 - 400 K/uL 287  344  316     RADS: N/a Assessment:   S/p robo lap appy to open ileocecectomy for perforated appendicitis  Stable. Wbc decreasing.  CPM.  labs/images/medications/previous chart entries reviewed personally and relevant changes/updates noted above.

## 2023-04-21 NOTE — Plan of Care (Signed)

## 2023-04-22 LAB — CBC
HCT: 34.5 % — ABNORMAL LOW (ref 36.0–46.0)
Hemoglobin: 11.6 g/dL — ABNORMAL LOW (ref 12.0–15.0)
MCH: 31.5 pg (ref 26.0–34.0)
MCHC: 33.6 g/dL (ref 30.0–36.0)
MCV: 93.8 fL (ref 80.0–100.0)
Platelets: 353 10*3/uL (ref 150–400)
RBC: 3.68 MIL/uL — ABNORMAL LOW (ref 3.87–5.11)
RDW: 13.5 % (ref 11.5–15.5)
WBC: 12.6 10*3/uL — ABNORMAL HIGH (ref 4.0–10.5)
nRBC: 0 % (ref 0.0–0.2)

## 2023-04-22 LAB — BASIC METABOLIC PANEL
Anion gap: 9 (ref 5–15)
BUN: 18 mg/dL (ref 8–23)
CO2: 23 mmol/L (ref 22–32)
Calcium: 8.3 mg/dL — ABNORMAL LOW (ref 8.9–10.3)
Chloride: 105 mmol/L (ref 98–111)
Creatinine, Ser: 0.61 mg/dL (ref 0.44–1.00)
GFR, Estimated: >60 mL/min (ref >60)
Glucose, Bld: 109 mg/dL — ABNORMAL HIGH (ref 70–99)
Potassium: 3.9 mmol/L (ref 3.5–5.1)
Sodium: 137 mmol/L (ref 135–145)

## 2023-04-22 MED ORDER — HYDRALAZINE HCL 20 MG/ML IJ SOLN
10.0000 mg | INTRAMUSCULAR | Status: DC | PRN
  Administered 2023-04-22 – 2023-04-23 (×3): 10 mg via INTRAVENOUS
  Filled 2023-04-22 (×5): qty 1

## 2023-04-22 MED ORDER — KCL IN DEXTROSE-NACL 40-5-0.45 MEQ/L-%-% IV SOLN
INTRAVENOUS | Status: DC
  Filled 2023-04-22 (×2): qty 1000

## 2023-04-22 NOTE — Progress Notes (Signed)
72yOF A&O x 4. MH of anxiety and HLD. Admitted on 04/19/2023 for acute appendictis s/p Robotic assisted laparoscopic appendectomy, open ileocecectomy on the 11th. Has prevena wound vac and JP drain/draining serosanguinous small amt. On cont IVF and voiding well. Pt BP has been increasing. Gave anxiety medication for insomnia prn, norco per pt request. Pt refused morphine. Zofran given for c/o nausea and abd discomfort. BP 207/95 MAP 126 Temp 98.3 HR 92 o2 96 RA at rest. She reported has had elevated BP in past but not on any BP meds. Reports no chest pain, headache or SOB. LBM noted on the 9th per ER documentation. Pt reported not passing gas. Hypoactive BS. MD notified. New orders received. Hydralazine prn IV given. BP improved. Will continue to monitor BP.

## 2023-04-22 NOTE — Plan of Care (Signed)

## 2023-04-22 NOTE — Progress Notes (Addendum)
Subjective:  CC: Brandy Parker is a 73 y.o. female  Hospital stay day 2, 2 Days Post-Op robo lap appy to open ileocecectomy for perforated appendicitis  HPI: Increased pain, nausea last night. Needed hydralizine as well.  Feeling slightly better this morning.  ROS:  General: Denies weight loss, weight gain, fatigue, fevers, chills, and night sweats. Heart: Denies chest pain, palpitations, racing heart, irregular heartbeat, leg pain or swelling, and decreased activity tolerance. Respiratory: Denies breathing difficulty, shortness of breath, wheezing, cough, and sputum. GI: Denies change in appetite, heartburn, nausea, vomiting, constipation, diarrhea, and blood in stool. GU: Denies difficulty urinating, pain with urinating, urgency, frequency, blood in urine.   Objective:   Temp:  [97.8 F (36.6 C)-98.8 F (37.1 C)] 98 F (36.7 C) (02/13 0648) Pulse Rate:  [84-100] 89 (02/13 0648) Resp:  [16-20] 17 (02/13 0648) BP: (142-207)/(69-95) 161/76 (02/13 0648) SpO2:  [95 %-99 %] 96 % (02/13 0648)     Height: 5\' 1"  (154.9 cm) Weight: 45.8 kg BMI (Calculated): 19.09   Intake/Output this shift:   Intake/Output Summary (Last 24 hours) at 04/22/2023 0720 Last data filed at 04/22/2023 9528 Gross per 24 hour  Intake 402.24 ml  Output 460 ml  Net -57.76 ml    Constitutional :  alert, cooperative, appears stated age, and no distress  Respiratory:  clear to auscultation bilaterally  Cardiovascular:  regular rate and rhythm  Gastrointestinal: Soft, no guarding, prevena intact, JP serous. TTP around incision as expected .   Skin: Cool and moist. Port site incisions c/d/i  Psychiatric: Normal affect, non-agitated, not confused       LABS:     Latest Ref Rng & Units 04/21/2023    6:24 AM 04/19/2023    5:16 PM 11/13/2022    1:28 PM  CMP  Glucose 70 - 99 mg/dL 68  94  413   BUN 8 - 23 mg/dL 26  30  15    Creatinine 0.44 - 1.00 mg/dL 2.44  0.10  2.72   Sodium 135 - 145 mmol/L 139  137  141    Potassium 3.5 - 5.1 mmol/L 4.0  4.6  4.2   Chloride 98 - 111 mmol/L 109  97  103   CO2 22 - 32 mmol/L 20  24  26    Calcium 8.9 - 10.3 mg/dL 8.1  9.3  9.7   Total Protein 6.5 - 8.1 g/dL  7.2  6.2   Total Bilirubin 0.0 - 1.2 mg/dL  0.6  <5.3   Alkaline Phos 38 - 126 U/L  39  52   AST 15 - 41 U/L  24  22   ALT 0 - 44 U/L  12  13       Latest Ref Rng & Units 04/21/2023    6:24 AM 04/19/2023    5:16 PM 05/15/2022    1:39 PM  CBC  WBC 4.0 - 10.5 K/uL 14.4  17.9  6.0   Hemoglobin 12.0 - 15.0 g/dL 66.4  40.3  47.4   Hematocrit 36.0 - 46.0 % 32.7  39.2  36.9   Platelets 150 - 400 K/uL 287  344  316     RADS: N/a Assessment:   S/p robo lap appy to open ileocecectomy for perforated appendicitis  Labs continue to be reassuring.  Increasing pain last night potentially from Exparel wearing off.  No immediate concerns at this time.  Will continue to await return of bowel function.  Encourage ambulation. Gum and hard candy  Continue IV antibiotics for perforated appendicitis   labs/images/medications/previous chart entries reviewed personally and relevant changes/updates noted above.

## 2023-04-23 LAB — CBC
HCT: 34.6 % — ABNORMAL LOW (ref 36.0–46.0)
Hemoglobin: 12 g/dL (ref 12.0–15.0)
MCH: 31.5 pg (ref 26.0–34.0)
MCHC: 34.7 g/dL (ref 30.0–36.0)
MCV: 90.8 fL (ref 80.0–100.0)
Platelets: 361 10*3/uL (ref 150–400)
RBC: 3.81 MIL/uL — ABNORMAL LOW (ref 3.87–5.11)
RDW: 13.5 % (ref 11.5–15.5)
WBC: 8.7 10*3/uL (ref 4.0–10.5)
nRBC: 0 % (ref 0.0–0.2)

## 2023-04-23 LAB — BASIC METABOLIC PANEL
Anion gap: 10 (ref 5–15)
BUN: 15 mg/dL (ref 8–23)
CO2: 23 mmol/L (ref 22–32)
Calcium: 8.6 mg/dL — ABNORMAL LOW (ref 8.9–10.3)
Chloride: 103 mmol/L (ref 98–111)
Creatinine, Ser: 0.63 mg/dL (ref 0.44–1.00)
GFR, Estimated: >60 mL/min (ref >60)
Glucose, Bld: 115 mg/dL — ABNORMAL HIGH (ref 70–99)
Potassium: 3.9 mmol/L (ref 3.5–5.1)
Sodium: 136 mmol/L (ref 135–145)

## 2023-04-23 MED ORDER — CELECOXIB 200 MG PO CAPS
200.0000 mg | ORAL_CAPSULE | Freq: Every day | ORAL | Status: DC
  Administered 2023-04-23 – 2023-04-26 (×4): 200 mg via ORAL
  Filled 2023-04-23 (×4): qty 1

## 2023-04-23 MED ORDER — GABAPENTIN 100 MG PO CAPS
100.0000 mg | ORAL_CAPSULE | Freq: Two times a day (BID) | ORAL | Status: DC
  Administered 2023-04-23 – 2023-04-26 (×7): 100 mg via ORAL
  Filled 2023-04-23 (×7): qty 1

## 2023-04-23 NOTE — Progress Notes (Signed)
Subjective:  CC: Brandy Parker is a 73 y.o. female  Hospital stay day 3, 3 Days Post-Op robo lap appy to open ileocecectomy for perforated appendicitis  HPI: Reports flatus this morning.  No other complaints except continued pain at incision site.  ROS:  General: Denies weight loss, weight gain, fatigue, fevers, chills, and night sweats. Heart: Denies chest pain, palpitations, racing heart, irregular heartbeat, leg pain or swelling, and decreased activity tolerance. Respiratory: Denies breathing difficulty, shortness of breath, wheezing, cough, and sputum. GI: Denies change in appetite, heartburn, nausea, vomiting, constipation, diarrhea, and blood in stool. GU: Denies difficulty urinating, pain with urinating, urgency, frequency, blood in urine.   Objective:   Temp:  [98.6 F (37 C)-100 F (37.8 C)] 100 F (37.8 C) (02/14 0837) Pulse Rate:  [92-97] 92 (02/14 0837) Resp:  [17-20] 18 (02/14 0837) BP: (161-187)/(81-103) 187/83 (02/14 0837) SpO2:  [95 %-97 %] 97 % (02/14 0837)     Height: 5\' 1"  (154.9 cm) Weight: 45.8 kg BMI (Calculated): 19.09   Intake/Output this shift:   Intake/Output Summary (Last 24 hours) at 04/23/2023 1311 Last data filed at 04/23/2023 1000 Gross per 24 hour  Intake 2381.6 ml  Output 285 ml  Net 2096.6 ml    Constitutional :  alert, cooperative, appears stated age, and no distress  Respiratory:  clear to auscultation bilaterally  Cardiovascular:  regular rate and rhythm  Gastrointestinal: Soft, no guarding, prevena intact, JP serous. TTP around incision as expected .   Skin: Cool and moist. Port site incisions c/d/i  Psychiatric: Normal affect, non-agitated, not confused       LABS:     Latest Ref Rng & Units 04/23/2023    6:06 AM 04/22/2023    7:17 AM 04/21/2023    6:24 AM  CMP  Glucose 70 - 99 mg/dL 161  096  68   BUN 8 - 23 mg/dL 15  18  26    Creatinine 0.44 - 1.00 mg/dL 0.45  4.09  8.11   Sodium 135 - 145 mmol/L 136  137  139   Potassium 3.5  - 5.1 mmol/L 3.9  3.9  4.0   Chloride 98 - 111 mmol/L 103  105  109   CO2 22 - 32 mmol/L 23  23  20    Calcium 8.9 - 10.3 mg/dL 8.6  8.3  8.1       Latest Ref Rng & Units 04/23/2023    6:06 AM 04/22/2023    7:17 AM 04/21/2023    6:24 AM  CBC  WBC 4.0 - 10.5 K/uL 8.7  12.6  14.4   Hemoglobin 12.0 - 15.0 g/dL 91.4  78.2  95.6   Hematocrit 36.0 - 46.0 % 34.6  34.5  32.7   Platelets 150 - 400 K/uL 361  353  287     RADS: N/a Assessment:   S/p robo lap appy to open ileocecectomy for perforated appendicitis  Doing well.  Will advance to clears.  Start Celebrex and Neurontin for better incisional pain control.  Continue IV antibiotics for perforated appendicitis   labs/images/medications/previous chart entries reviewed personally and relevant changes/updates noted above.

## 2023-04-23 NOTE — Plan of Care (Signed)

## 2023-04-23 NOTE — TOC CM/SW Note (Signed)
Transition of Care [facility]) - Inpatient Brief Assessment   Patient Details  Name: Brandy Parker MRN: [id number] Date of Birth: 12-07-1950  Transition of Care [facility]) CM/SW Contact:    Chapman Fitch, RN Phone Number: 04/23/2023, 2:59 PM   Clinical Narrative:   Transition of Care (TOC) Screening Note   Patient Details  Name: Brandy Parker Date of Birth: 11/18/1950   Transition of Care [facility]) CM/SW Contact:    Chapman Fitch, RN Phone Number: 04/23/2023, 2:59 PM    Transition of Care Department [facility]) has reviewed patient and no TOC needs have been identified at this time.  If new patient transition needs arise, please place a TOC consult.    Transition of Care Asessment: Insurance and Status: Insurance coverage has been reviewed Patient has primary care physician: Yes     Prior/Current Home Services: No current home services Social Drivers of Health Review: SDOH reviewed no interventions necessary Readmission risk has been reviewed: Yes Transition of care needs: no transition of care needs at this time

## 2023-04-24 LAB — CBC
HCT: 34.2 % — ABNORMAL LOW (ref 36.0–46.0)
Hemoglobin: 11.6 g/dL — ABNORMAL LOW (ref 12.0–15.0)
MCH: 31.3 pg (ref 26.0–34.0)
MCHC: 33.9 g/dL (ref 30.0–36.0)
MCV: 92.2 fL (ref 80.0–100.0)
Platelets: 386 10*3/uL (ref 150–400)
RBC: 3.71 MIL/uL — ABNORMAL LOW (ref 3.87–5.11)
RDW: 13.5 % (ref 11.5–15.5)
WBC: 9 10*3/uL (ref 4.0–10.5)
nRBC: 0 % (ref 0.0–0.2)

## 2023-04-24 LAB — BASIC METABOLIC PANEL
Anion gap: 10 (ref 5–15)
BUN: 17 mg/dL (ref 8–23)
CO2: 21 mmol/L — ABNORMAL LOW (ref 22–32)
Calcium: 8.8 mg/dL — ABNORMAL LOW (ref 8.9–10.3)
Chloride: 105 mmol/L (ref 98–111)
Creatinine, Ser: 0.73 mg/dL (ref 0.44–1.00)
GFR, Estimated: >60 mL/min (ref >60)
Glucose, Bld: 81 mg/dL (ref 70–99)
Potassium: 3.8 mmol/L (ref 3.5–5.1)
Sodium: 136 mmol/L (ref 135–145)

## 2023-04-24 NOTE — Progress Notes (Signed)
Patient ID: Brandy Parker, female   DOB: January 16, 1951, 73 y.o.   MRN: [id number]     SURGICAL PROGRESS NOTE   Hospital Day(s): 4.   Interval History: Patient seen and examined, no acute events or new complaints overnight. Patient reports feeling a little bit better.  She endorses some soreness on the incisional area but no significant abdominal pain.  No pain radiation.  No nausea or vomiting.  She endorses passing gas  Vital signs in last 24 hours: [min-max] current  Temp:  [97.9 F (36.6 C)-100.4 F (38 C)] 97.9 F (36.6 C) (02/15 0846) Pulse Rate:  [91-103] 96 (02/15 0846) Resp:  [14-20] 17 (02/15 0846) BP: (114-189)/(60-91) 155/81 (02/15 0846) SpO2:  [92 %-97 %] 96 % (02/15 0846)     Height: 5\' 1"  (154.9 cm) Weight: 45.8 kg BMI (Calculated): 19.09   Physical Exam:  Constitutional: alert, cooperative and no distress  Respiratory: breathing non-labored at rest  Cardiovascular: regular rate and sinus rhythm  Gastrointestinal: soft, non-tender, and non-distended.  Incision covered with Prevena  Labs:     Latest Ref Rng & Units 04/24/2023    5:54 AM 04/23/2023    6:06 AM 04/22/2023    7:17 AM  CBC  WBC 4.0 - 10.5 K/uL 9.0  8.7  12.6   Hemoglobin 12.0 - 15.0 g/dL 13.2  44.0  10.2   Hematocrit 36.0 - 46.0 % 34.2  34.6  34.5   Platelets 150 - 400 K/uL 386  361  353       Latest Ref Rng & Units 04/24/2023    5:54 AM 04/23/2023    6:06 AM 04/22/2023    7:17 AM  CMP  Glucose 70 - 99 mg/dL 81  725  366   BUN 8 - 23 mg/dL 17  15  18    Creatinine 0.44 - 1.00 mg/dL 4.40  3.47  4.25   Sodium 135 - 145 mmol/L 136  136  137   Potassium 3.5 - 5.1 mmol/L 3.8  3.9  3.9   Chloride 98 - 111 mmol/L 105  103  105   CO2 22 - 32 mmol/L 21  23  23    Calcium 8.9 - 10.3 mg/dL 8.8  8.6  8.3     Imaging studies: No new pertinent imaging studies   Assessment/Plan:  73 y.o. female with perforated appendicitis 4 Days Post-Op s/p ileocecectomy.  -Recovering slowly but adequately -Stable vital  sign -Abdominal physical exam without acute abdomen.  Some pain in the incisional area -Slowly returning of bowel function.  Will advance diet to full liquids -Encouraged the patient to ambulate -Continue pain management  Gae Gallop, MD

## 2023-04-24 NOTE — Plan of Care (Signed)

## 2023-04-25 LAB — BASIC METABOLIC PANEL
Anion gap: 10 (ref 5–15)
BUN: 14 mg/dL (ref 8–23)
CO2: 25 mmol/L (ref 22–32)
Calcium: 8.9 mg/dL (ref 8.9–10.3)
Chloride: 104 mmol/L (ref 98–111)
Creatinine, Ser: 0.64 mg/dL (ref 0.44–1.00)
GFR, Estimated: >60 mL/min (ref >60)
Glucose, Bld: 90 mg/dL (ref 70–99)
Potassium: 3.8 mmol/L (ref 3.5–5.1)
Sodium: 139 mmol/L (ref 135–145)

## 2023-04-25 LAB — CBC
HCT: 34.9 % — ABNORMAL LOW (ref 36.0–46.0)
Hemoglobin: 11.9 g/dL — ABNORMAL LOW (ref 12.0–15.0)
MCH: 31.2 pg (ref 26.0–34.0)
MCHC: 34.1 g/dL (ref 30.0–36.0)
MCV: 91.6 fL (ref 80.0–100.0)
Platelets: 423 10*3/uL — ABNORMAL HIGH (ref 150–400)
RBC: 3.81 MIL/uL — ABNORMAL LOW (ref 3.87–5.11)
RDW: 13.4 % (ref 11.5–15.5)
WBC: 10.6 10*3/uL — ABNORMAL HIGH (ref 4.0–10.5)
nRBC: 0 % (ref 0.0–0.2)

## 2023-04-25 MED ORDER — DOCUSATE SODIUM 100 MG PO CAPS
100.0000 mg | ORAL_CAPSULE | Freq: Every day | ORAL | Status: DC
  Administered 2023-04-25 – 2023-04-26 (×2): 100 mg via ORAL
  Filled 2023-04-25 (×2): qty 1

## 2023-04-25 NOTE — Progress Notes (Signed)
Patient ID: Brandy Parker, female   DOB: 27-May-1950, 73 y.o.   MRN: [id number]     SURGICAL PROGRESS NOTE   Hospital Day(s): 5.   Interval History: Patient seen and examined, no acute events or new complaints overnight. Patient reports feeling better this morning.  She states she had a good day yesterday.  She continued passing gas.  She denies any nausea or pain with full liquids.  Vital signs in last 24 hours: [min-max] current  Temp:  [98 F (36.7 C)-99.1 F (37.3 C)] 99.1 F (37.3 C) (02/16 0823) Pulse Rate:  [86-97] 91 (02/16 0823) Resp:  [16-20] 18 (02/16 0823) BP: (127-173)/(78-91) 166/83 (02/16 0823) SpO2:  [95 %-100 %] 95 % (02/16 0823)     Height: 5\' 1"  (154.9 cm) Weight: 45.8 kg BMI (Calculated): 19.09   Physical Exam:  Constitutional: alert, cooperative and no distress  Respiratory: breathing non-labored at rest  Cardiovascular: regular rate and sinus rhythm  Gastrointestinal: soft, non-tender, and non-distended  Labs:     Latest Ref Rng & Units 04/25/2023    6:16 AM 04/24/2023    5:54 AM 04/23/2023    6:06 AM  CBC  WBC 4.0 - 10.5 K/uL 10.6  9.0  8.7   Hemoglobin 12.0 - 15.0 g/dL 40.9  81.1  91.4   Hematocrit 36.0 - 46.0 % 34.9  34.2  34.6   Platelets 150 - 400 K/uL 423  386  361       Latest Ref Rng & Units 04/25/2023    6:16 AM 04/24/2023    5:54 AM 04/23/2023    6:06 AM  CMP  Glucose 70 - 99 mg/dL 90  81  782   BUN 8 - 23 mg/dL 14  17  15    Creatinine 0.44 - 1.00 mg/dL 9.56  2.13  0.86   Sodium 135 - 145 mmol/L 139  136  136   Potassium 3.5 - 5.1 mmol/L 3.8  3.8  3.9   Chloride 98 - 111 mmol/L 104  105  103   CO2 22 - 32 mmol/L 25  21  23    Calcium 8.9 - 10.3 mg/dL 8.9  8.8  8.6     Imaging studies: No new pertinent imaging studies   Assessment/Plan:  73 y.o. female with perforated appendicitis 5 Days Post-Op s/p ileocecectomy.   -Recovering slowly but adequately -Stable vital sign -Continue adequate progress.  Tolerated full liquid diet.  No  significant pain.  Continue passing gas.  Will advance diet to soft diet. -Will add Colace as patient endorses that this helped her with her usual constipation -Encouraged the patient to ambulate -Continue pain management  Gae Gallop, MD

## 2023-04-25 NOTE — Plan of Care (Signed)

## 2023-04-25 NOTE — Evaluation (Signed)
Physical Therapy Evaluation Patient Details Name: Brandy Parker MRN: [id number] DOB: [birth year] Today's Date: 04/25/2023  History of Present Illness  73 y.o. female with perforated appendicitis 5 Days Post-Op s/p ileocecectomy.  Clinical Impression  Pt received in bed agreeable to PT intervention. Pt has been using bathroom in room without help as per pt. Pt reported of feel better today. Pt is Ind at home, works full time and drives. Pt accumulates everything and has too much stuff at home creating unsafe environment which she wants to clean up for her own safety. PT assessment revealed generalized weakness and mild impaired balance with mobility without AD. Pt ambulated with SBA with FWW and  CGA of 1 without AD with dec velocity increasing fall risk. PT willl continue in acute care and pt will benefit form mobility specialist interventions. PT will continue beyond Acute to help pt return to her PLOF.       If plan is discharge home, recommend the following: A little help with walking and/or transfers;A little help with bathing/dressing/bathroom   Can travel by private vehicle        Equipment Recommendations Rolling walker (2 wheels);BSC/3in1  Recommendations for Other Services       Functional Status Assessment Patient has had a recent decline in their functional status and demonstrates the ability to make significant improvements in function in a reasonable and predictable amount of time.     Precautions / Restrictions Precautions Precautions: Fall Restrictions Weight Bearing Restrictions Per Provider Order: No      Mobility  Bed Mobility Overal bed mobility: Independent                  Transfers Overall transfer level: Independent                      Ambulation/Gait Ambulation/Gait assistance: Contact guard assist Gait Distance (Feet): 150 Feet Assistive device: Rolling walker (2 wheels) (and 50 ft without AD) Gait Pattern/deviations: Decreased  step length - right, Decreased step length - left (Uneven stpeping and lateral sways without AD.) Gait velocity: dec     General Gait Details: slow and unsteady on feet.  Stairs            Wheelchair Mobility     Tilt Bed    Modified Rankin (Stroke Patients Only)       Balance Overall balance assessment: Needs assistance Sitting-balance support: No upper extremity supported Sitting balance-Leahy Scale: Normal     Standing balance support: Bilateral upper extremity supported Standing balance-Leahy Scale: Good Standing balance comment: No LOB noted without AD. Sways without AD>                             Pertinent Vitals/Pain Pain Assessment Pain Assessment: No/denies pain    Home Living Family/patient expects to be discharged to:: Private residence Living Arrangements: Alone   Type of Home: House Home Access: Stairs to enter Entrance Stairs-Rails: Right Entrance Stairs-Number of Steps: 2 front and back   Home Layout: One level Home Equipment: Standard Walker Additional Comments: Pt did not hsower. She takes baths only. She does not have shower.    Prior Function Prior Level of Function : Independent/Modified Independent             Mobility Comments: Independent at household level and community level without AD. 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  Extremity/Trunk Assessment   Upper Extremity Assessment Upper Extremity Assessment: Overall WFL for tasks assessed    Lower Extremity Assessment Lower Extremity Assessment: Generalized weakness       Communication   Communication Communication: No apparent difficulties    Cognition Arousal: Alert Behavior During Therapy: WFL for tasks assessed/performed   PT - Cognitive impairments: No apparent impairments                         Following commands: Intact       Cueing Cueing Techniques: Verbal cues     General Comments       Exercises General Exercises - Lower Extremity Ankle Circles/Pumps: AROM, 10 reps, Seated, Both Long Arc Quad: AROM, Both, 10 reps, Seated   Assessment/Plan    PT Assessment Patient needs continued PT services  PT Problem List Decreased strength;Decreased activity tolerance;Decreased balance;Decreased mobility       PT Treatment Interventions Gait training;Stair training;Therapeutic activities;Therapeutic exercise;Functional mobility training;Balance training;Neuromuscular re-education;Patient/family education    PT Goals (Current goals can be found in the Care Plan section)  Acute Rehab PT Goals Patient Stated Goal: " Return home ." PT Goal Formulation: With patient Time For Goal Achievement: 05/09/23 Potential to Achieve Goals: Good    Frequency Min 2X/week     Co-evaluation               AM-PAC PT "6 Clicks" Mobility  Outcome Measure Help needed turning from your back to your side while in a flat bed without using bedrails?: None Help needed moving from lying on your back to sitting on the side of a flat bed without using bedrails?: None Help needed moving to and from a bed to a chair (including a wheelchair)?: None Help needed standing up from a chair using your arms (e.g., wheelchair or bedside chair)?: None Help needed to walk in hospital room?: A Little Help needed climbing 3-5 steps with a railing? : A Little 6 Click Score: 22    End of Session Equipment Utilized During Treatment: Gait belt Activity Tolerance: Patient tolerated treatment well Patient left: with call bell/phone within reach;with chair alarm set;in bed Nurse Communication: Mobility status PT Visit Diagnosis: Unsteadiness on feet (R26.81);Muscle weakness (generalized) (M62.81)    Time: 1400-1416 PT Time Calculation (min) (ACUTE ONLY): 16 min   Charges:   PT Evaluation $PT Eval Low Complexity: 1 Low   PT General Charges $$ ACUTE PT VISIT: 1 Visit         Jeremy Ditullio PT DPT 3:58  PM,04/25/23

## 2023-04-26 LAB — BASIC METABOLIC PANEL
Anion gap: 7 (ref 5–15)
BUN: 16 mg/dL (ref 8–23)
CO2: 27 mmol/L (ref 22–32)
Calcium: 8.9 mg/dL (ref 8.9–10.3)
Chloride: 104 mmol/L (ref 98–111)
Creatinine, Ser: 0.67 mg/dL (ref 0.44–1.00)
GFR, Estimated: >60 mL/min (ref >60)
Glucose, Bld: 99 mg/dL (ref 70–99)
Potassium: 3.8 mmol/L (ref 3.5–5.1)
Sodium: 138 mmol/L (ref 135–145)

## 2023-04-26 LAB — CBC
HCT: 34.3 % — ABNORMAL LOW (ref 36.0–46.0)
Hemoglobin: 11.8 g/dL — ABNORMAL LOW (ref 12.0–15.0)
MCH: 31.6 pg (ref 26.0–34.0)
MCHC: 34.4 g/dL (ref 30.0–36.0)
MCV: 91.7 fL (ref 80.0–100.0)
Platelets: 438 10*3/uL — ABNORMAL HIGH (ref 150–400)
RBC: 3.74 MIL/uL — ABNORMAL LOW (ref 3.87–5.11)
RDW: 13.3 % (ref 11.5–15.5)
WBC: 10.3 10*3/uL (ref 4.0–10.5)
nRBC: 0 % (ref 0.0–0.2)

## 2023-04-26 MED ORDER — DOCUSATE SODIUM 100 MG PO CAPS: 100.0000 mg | ORAL_CAPSULE | Freq: Two times a day (BID) | ORAL | 0 refills | Status: AC | PRN

## 2023-04-26 MED ORDER — IBUPROFEN 400 MG PO TABS: 400.0000 mg | ORAL_TABLET | Freq: Three times a day (TID) | ORAL | 0 refills | Status: DC | PRN

## 2023-04-26 MED ORDER — ACETAMINOPHEN 325 MG PO TABS: 650.0000 mg | ORAL_TABLET | Freq: Three times a day (TID) | ORAL | 0 refills | Status: AC | PRN

## 2023-04-26 MED ORDER — OXYCODONE-ACETAMINOPHEN 5-325 MG PO TABS: 1.0000 | ORAL_TABLET | Freq: Three times a day (TID) | ORAL | 0 refills | Status: DC | PRN

## 2023-04-26 NOTE — Discharge Instructions (Signed)
appendectomy, Care After This sheet gives you information about how to care for yourself after your procedure. Your health care provider may also give you more specific instructions. If you have problems or questions, contact your health care provider. What can I expect after the procedure? After your procedure, it is common to have the following: Pain in your abdomen, especially in the incision areas. You will be given medicine to control the pain. Tiredness. This is a normal part of the recovery process. Your energy level will return to normal over the next several weeks. Changes in your bowel movements, such as constipation or needing to go more often. Talk with your health care provider about how to manage this. Follow these instructions at home: Medicines  tylenol and advil as needed for discomfort.  Please alternate between the two every four hours as needed for pain.    Use narcotics, if prescribed, only when tylenol and motrin is not enough to control pain.  325-650mg  every 8hrs to max of 4000mg /24hrs (including the 325mg  in every norco dose) for the tylenol.    Advil up to 800mg  per dose every 8hrs as needed for pain.   Do not drive or use heavy machinery while taking prescription pain medicine. Do not drink alcohol while taking prescription pain medicine. If you were prescribed an antibiotic medicine, use it as told by your health care provider. Do not stop using the antibiotic even if you start to feel better. Incision care    Follow instructions from your health care provider about how to take care of your incision areas. Make sure you: REMOVE MIDLINE DRESSING AND SPONGE WHEN MACHINE STARTS BEEPING, INDICATING BATTERY IS ABOUT TO DIE.   STAPLES UNDERNEATH THE DRESSING CAN STAY UNCOVERED OK TO SPONGE BATH AROUND INCISION AND DRAIN.  DO NOT SUBMERGE THE WOUNDS UNDER WATER. EMPTY DRAIN AND CHANGE DRAIN SPONGE DAILY AS DIRECTED Leave stitches (sutures), skin glue, or adhesive strips  in place. These skin closures may need to stay in place for 2 weeks or longer. If adhesive strip edges start to loosen and curl up, you may trim the loose edges. Do not remove adhesive strips completely unless your health care provider tells you to do that. Do not wear tight clothing over the incisions. Tight clothing may rub and irritate the incision areas, which may cause the incisions to open. Do not take baths, swim, or use a hot tub until your health care provider approves.  Check your incision area every day for signs of infection. Check for: More redness, swelling, or pain. More fluid or blood. Warmth. Pus or a bad smell. Activity Avoid lifting anything that is heavier than 10 lb (4.5 kg) for 2 weeks or until your health care provider says it is okay. You may resume normal activities as told by your health care provider. Ask your health care provider what activities are safe for you. Take rest breaks during the day as needed. Eating and drinking Follow instructions from your health care provider about what you can eat after surgery. To prevent or treat constipation while you are taking prescription pain medicine, your health care provider may recommend that you: Drink enough fluid to keep your urine clear or pale yellow. Take over-the-counter or prescription medicines. Eat foods that are high in fiber, such as fresh fruits and vegetables, whole grains, and beans. Limit foods that are high in fat and processed sugars, such as fried and sweet foods. General instructions Ask your health care provider when you  will need an appointment to get your sutures or staples removed. Keep all follow-up visits as told by your health care provider. This is important. Contact a health care provider if: You have more redness, swelling, or pain around your incisions. You have more fluid or blood coming from the incisions. Your incisions feel warm to the touch. You have pus or a bad smell coming from  your incisions or your dressing. You have a fever. You have an incision that breaks open (edges not staying together) after sutures or staples have been removed. Get help right away if: You develop a rash. You have chest pain or difficulty breathing. You have pain or swelling in your legs. You feel light-headed or you faint. Your abdomen swells (becomes distended). You have nausea or vomiting. You have blood in your stool (feces). This information is not intended to replace advice given to you by your health care provider. Make sure you discuss any questions you have with your health care provider. Document Released: 09/12/2004 Document Revised: 11/12/2017 Document Reviewed: 11/25/2015 Elsevier Interactive Patient Education  2019 [employer].

## 2023-04-26 NOTE — Plan of Care (Signed)

## 2023-04-26 NOTE — TOC Transition Note (Signed)
Transition of Care [facility]) - Discharge Note   Patient Details  Name: Brandy Parker MRN: [id number] Date of Birth: December 02, 1950  Transition of Care [facility]) CM/SW Contact:  Garret Reddish, RN Phone Number: 04/26/2023, 4:13 PM   Clinical Narrative:    Chart reviewed.  Noted that patient has orders for discharge today.  I have informed patient that PT has recommended home health PT.  Patient has declined at this time.  I have informed patient that PT has recommended Rolling walker and BSC.  Patient has declined BSC but would like rolling walker.    I have asked Jon with [employer] with provide patient a rolling walker at bedside prior to discharge today.   I have informed staff nurse of the above information.     Final next level of care: Home/Self Care Barriers to Discharge: No Barriers Identified   Patient Goals and CMS Choice   CMS Medicare.gov Compare Post Acute Care list provided to:: Patient Choice offered to / list presented to : Patient      Discharge Placement                    Patient and family notified of of transfer: 04/26/23  Discharge Plan and Services Additional resources added to the After Visit Summary for                  DME Arranged: Walker rolling DME Agency: [employer] Date DME Agency Contacted: 04/26/23   Representative spoke with at DME Agency: Cletis [facility] Arranged:  (Patient has declined home health.)          Social Drivers of Health (SDOH) Interventions SDOH Screenings   Food Insecurity: No Food Insecurity (04/20/2023)  Housing: Low Risk  (04/20/2023)  Transportation Needs: No Transportation Needs (04/20/2023)  Utilities: Not At Risk (04/20/2023)  Alcohol Screen: Low Risk  (06/01/2022)  Depression (PHQ2-9): Low Risk  (02/12/2023)  Financial Resource Strain: Low Risk  (02/11/2023)  Physical Activity: Insufficiently Active (02/11/2023)  Social Connections: Moderately Isolated (04/20/2023)  Stress: No Stress Concern Present (02/11/2023)  Tobacco Use:  Medium Risk (04/20/2023)     Readmission Risk Interventions     No data to display

## 2023-04-26 NOTE — Evaluation (Signed)
Occupational Therapy Evaluation Patient Details Name: Brandy Parker MRN: [id number] DOB: 07/10/1950 Today's Date: 04/26/2023   History of Present Illness   73 y.o. female with perforated appendicitis s/p ileocecectomy on 04/20/23     Clinical Impressions Prior to hospital admission, pt was independent, driving and working full-time. Pt lives alone, A&Ox4, able to recall details of hospital stay. Pt has been performing ADLs in room with distant supervision from staff, received exiting bathroom after toileting, no AD. Pt returns to recliner, reaching out for furniture for slight stabilization while walking. Completes multiple transfers with distant supervision, reaches down to manage drain without LOB, and functional mobility ~100 ft in hallway with RW + supervision. UE WFL for tasks assessed. Pt politely declining further skilled acute OT needs at this time as she feels very close/at her baseline. Discussed appropriate AE/DME with pt for continued ADL performance, pt verbalizing understanding. OT will complete orders and sign off - pt declines HH OT at hospital discharge.      If plan is discharge home, recommend the following:   Assist for transportation;A little help with walking and/or transfers;A little help with bathing/dressing/bathroom     Functional Status Assessment   Patient has had a recent decline in their functional status and demonstrates the ability to make significant improvements in function in a reasonable and predictable amount of time.     Equipment Recommendations   Other (comment) (RW)     Recommendations for Other Services         Precautions/Restrictions   Precautions Precautions: Fall Recall of Precautions/Restrictions: Intact Restrictions Weight Bearing Restrictions Per Provider Order: No     Mobility Bed Mobility               General bed mobility comments: NT - pt recieved and left in recliner    Transfers Overall transfer  level: Independent Equipment used: None               General transfer comment: no LOB noted, supervision for safety on OT eval      Balance Overall balance assessment: Needs assistance Sitting-balance support: No upper extremity supported Sitting balance-Leahy Scale: Normal     Standing balance support: Bilateral upper extremity supported Standing balance-Leahy Scale: Good Standing balance comment: steady static standing, mild sway without AD                           ADL either performed or assessed with clinical judgement   ADL Overall ADL's : Modified independent                         Toilet Transfer: Supervision/safety;Ambulation Toilet Transfer Details (indicate cue type and reason): Pt has been performing toileting in room with distant supervision; recieved in room, exiting bathroom with no AD, lightly using furniture to guide self walking back to recliner. observed multiple STS transfers with no LOB, supervision Toileting- Clothing Manipulation and Hygiene: Supervision/safety;Sit to/from stand Toileting - Clothing Manipulation Details (indicate cue type and reason): pt completes tasks in bathroom with distant supervision from staff     Functional mobility during ADLs: Supervision/safety;Rolling walker (2 wheels) General ADL Comments: Pt reports she feels close to baseline, and has been performing ADLs with supervision in room.     Vision Baseline Vision/History: 1 Wears glasses Ability to See in Adequate Light: 0 Adequate              Pertinent Vitals/Pain  Pain Assessment Pain Assessment: 0-10 (0 to "maybe .5" per pt) Pain Score: 1  Pain Location: .5 "maybe", surgical site abdomen Pain Descriptors / Indicators: Discomfort Pain Intervention(s): Limited activity within patient's tolerance, Monitored during session     Extremity/Trunk Assessment Upper Extremity Assessment Upper Extremity Assessment: Overall WFL for tasks assessed    Lower Extremity Assessment Lower Extremity Assessment: Defer to PT evaluation   Cervical / Trunk Assessment Cervical / Trunk Assessment: Normal   Communication Communication Communication: No apparent difficulties   Cognition Arousal: Alert Behavior During Therapy: WFL for tasks assessed/performed Cognition: No apparent impairments                               Following commands: Intact       Cueing  General Comments   Cueing Techniques: Verbal cues              Home Living Family/patient expects to be discharged to:: Private residence Living Arrangements: Alone   Type of Home: House Home Access: Stairs to enter [employer] of Steps: 2 front and back Entrance Stairs-Rails: Right Home Layout: One level     Bathroom Shower/Tub: Tub only   Firefighter: Standard Bathroom Accessibility: No   Home Equipment: Standard Walker          Prior Functioning/Environment Prior Level of Function : Independent/Modified Independent             Mobility Comments: Independent at household level and community level without AD. 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 ADLs Comments: independent     AM-PAC OT "6 Clicks" Daily Activity     Outcome Measure Help from another person eating meals?: None Help from another person taking care of personal grooming?: None Help from another person toileting, which includes using toliet, bedpan, or urinal?: A Little Help from another person bathing (including washing, rinsing, drying)?: A Little Help from another person to put on and taking off regular upper body clothing?: None Help from another person to put on and taking off regular lower body clothing?: A Little 6 Click Score: 21   End of Session Equipment Utilized During Treatment: Rolling walker (2 wheels) Nurse Communication: Mobility status  Activity Tolerance: Patient tolerated treatment well Patient left: in  chair;with call bell/phone within reach;with SCD's reapplied  OT Visit Diagnosis: Other abnormalities of gait and mobility (R26.89)                Time: 3086-5784 OT Time Calculation (min): 16 min Charges:  OT General Charges $OT Visit: 1 Visit OT Evaluation $OT Eval Low Complexity: 1 Low  Eliazar Olivar L. Odie Edmonds, OTR/L  04/26/23, 10:48 AM

## 2023-04-26 NOTE — Progress Notes (Signed)
Mobility Specialist - Progress Note   04/26/23 1002  Mobility  Activity Ambulated with assistance in room;Transferred from bed to chair  Level of Assistance Contact guard assist, steadying assist  Assistive Device None  Distance Ambulated (ft) 6 ft  Activity Response Tolerated well  Mobility visit 1 Mobility  Mobility Specialist Start Time (ACUTE ONLY) [id number]  Mobility Specialist Stop Time (ACUTE ONLY) 0950  Mobility Specialist Time Calculation (min) (ACUTE ONLY) 8 min   Pt supine upon entry, utilizing RA. Pt denied OOB amb this date, however agreeable to transfer to the recliner for breakfast. Pt completed bed mob indep, STS to RW MinG and and amb to the recliner CGA with no AD (per Pt request), holding on to the railing of the recliner during transfer. Pt left seated in the recliner with needs within reach, RN notified.  Zetta Bills Mobility Specialist 04/26/23 10:02 AM

## 2023-04-28 NOTE — Discharge Summary (Signed)
Physician Discharge Summary  Patient ID: Brandy Parker MRN: [id number] DOB/AGE: 07/28/1950 73 y.o.  Admit date: 04/19/2023 Discharge date: 04/26/2023  Admission Diagnoses: Perforated appendicitis  Discharge Diagnoses:  Same as above  Discharged Condition: good  Hospital Course: admitted for above. Underwent surgery.  Please see op note for details.  Post op, recovered as expected.  At time of d/c, tolerating diet and pain controlled.  JP and midline Prevena VAC in place  Consults: None  Discharge Exam: Blood pressure (!) 158/80, pulse 90, temperature 98.6 F (37 C), resp. rate 17, height 5\' 1"  (1.549 m), weight 45.8 kg, SpO2 94%. General appearance: alert, cooperative, and no distress GI: Soft, minimal tenderness to palpation along incision site as expected.  JP with serosanguineous drainage.  Disposition:  Discharge disposition: 01-Home or Self Care       Discharge Instructions     Discharge patient   Complete by: As directed    Discharge disposition: 01-Home or Self Care   Discharge patient date: 04/26/2023      Allergies as of 04/26/2023       Reactions   Sulfa Antibiotics Other (See Comments)   Per patient, unknown reaction        Medication List     TAKE these medications    acetaminophen 325 MG tablet Commonly known as: Tylenol Take 2 tablets (650 mg total) by mouth every 8 (eight) hours as needed for mild pain (pain score 1-3).   ALPRAZolam 0.25 MG tablet Commonly known as: XANAX Take 1 tablet (0.25 mg total) by mouth at bedtime as needed for anxiety.   cholecalciferol 25 MCG (1000 UNIT) tablet Commonly known as: VITAMIN D3 Take 1,000 Units by mouth daily.   docusate sodium 100 MG capsule Commonly known as: Colace Take 1 capsule (100 mg total) by mouth 2 (two) times daily as needed for up to 10 days for mild constipation.   escitalopram 10 MG tablet Commonly known as: LEXAPRO Take 1 tablet (10 mg total) by mouth daily.   ibuprofen 400 MG  tablet Commonly known as: ADVIL Take 1 tablet (400 mg total) by mouth every 8 (eight) hours as needed for mild pain (pain score 1-3) or moderate pain (pain score 4-6).   multivitamin tablet Take 1 tablet by mouth daily.   oxyCODONE-acetaminophen 5-325 MG tablet Commonly known as: Percocet Take 1 tablet by mouth every 8 (eight) hours as needed for severe pain (pain score 7-10).   simvastatin 20 MG tablet Commonly known as: ZOCOR Take 1 tablet (20 mg total) by mouth daily at 6 PM.        Follow-up Information     Sanela Evola, DO. Go in 1 week(s).   Specialties: General Surgery, Surgery Why: post op staple and drain removal 05/03/23; go at 2:15pm Contact information: [address] [facility] [place] [address] [phone]                  Total time spent arranging discharge was >56min. Signed: Sung Amabile 04/28/2023, 9:21 AM

## 2023-04-29 LAB — SURGICAL PATHOLOGY

## 2023-05-23 NOTE — Patient Instructions (Signed)
 Be Involved in Caring For Your Health:  Taking Medications When medications are taken as directed, they can greatly improve your health. But if they are not taken as prescribed, they may not work. In some cases, not taking them correctly can be harmful. To help ensure your treatment remains effective and safe, understand your medications and how to take them. Bring your medications to each visit for review by your provider.  Your lab results, notes, and after visit summary will be available on My Chart. We strongly encourage you to use this feature. If lab results are abnormal the clinic will contact you with the appropriate steps. If the clinic does not contact you assume the results are satisfactory. You can always view your results on My Chart. If you have questions regarding your health or results, please contact the clinic during office hours. You can also ask questions on My Chart.  We at [facility] are grateful that you chose [place] to provide your care. We strive to provide evidence-based and compassionate care and are always looking for feedback. If you get a survey from the clinic please complete this so we can hear your opinions.  Managing Anxiety, Adult After being diagnosed with anxiety, you may be relieved to know why you have felt or behaved a certain way. You may also feel overwhelmed about the treatment ahead and what it will mean for your life. With care and support, you can manage your anxiety. How to manage lifestyle changes Understanding the difference between stress and anxiety Although stress can play a role in anxiety, it is not the same as anxiety. Stress is your body's reaction to life changes and events, both good and bad. Stress is often caused by something external, such as a deadline, test, or competition. It normally goes away after the event has ended and will last just a few hours. But, stress can be ongoing and can lead to more than just stress. Anxiety is  caused by something internal, such as imagining a terrible outcome or worrying that something will go wrong that will greatly upset you. Anxiety often does not go away even after the event is over, and it can become a long-term (chronic) worry. Lowering stress and anxiety Talk with your health care provider or a counselor to learn more about lowering anxiety and stress. They may suggest tension-reduction techniques, such as: Music. Spend time creating or listening to music that you enjoy and that inspires you. Mindfulness-based meditation. Practice being aware of your normal breaths while not trying to control your breathing. It can be done while sitting or walking. Centering prayer. Focus on a word, phrase, or sacred image that means something to you and brings you peace. Deep breathing. Expand your stomach and inhale slowly through your nose. Hold your breath for 3-5 seconds. Then breathe out slowly, letting your stomach muscles relax. Self-talk. Learn to notice and spot thought patterns that lead to anxiety reactions. Change those patterns to thoughts that feel peaceful. Muscle relaxation. Take time to tense muscles and then relax them. Choose a tension-reduction technique that fits your lifestyle and personality. These techniques take time and practice. Set aside 5-15 minutes a day to do them. Specialized therapists can offer counseling and training in these techniques. The training to help with anxiety may be covered by some insurance plans. Other things you can do to manage stress and anxiety include: Keeping a stress diary. This can help you learn what triggers your reaction and then learn ways  to manage your response. Thinking about how you react to certain situations. You may not be able to control everything, but you can control your response. Making time for activities that help you relax and not feeling guilty about spending your time in this way. Doing visual imagery. This involves  imagining or creating mental pictures to help you relax. Practicing yoga. Through yoga poses, you can lower tension and relax.  Medicines Medicines for anxiety include: Antidepressant medicines. These are usually prescribed for long-term daily control. Anti-anxiety medicines. These may be added in severe cases, especially when panic attacks occur. When used together, medicines, psychotherapy, and tension-reduction techniques may be the most effective treatment. Relationships Relationships can play a big part in helping you recover. Spend more time connecting with trusted friends and family members. Think about going to couples counseling if you have a partner, taking family education classes, or going to family therapy. Therapy can help you and others better understand your anxiety. How to recognize changes in your anxiety Everyone responds differently to treatment for anxiety. Recovery from anxiety happens when symptoms lessen and stop interfering with your daily life at home or work. This may mean that you will start to: Have better concentration and focus. Worry will interfere less in your daily thinking. Sleep better. Be less irritable. Have more energy. Have improved memory. Try to recognize when your condition is getting worse. Contact your provider if your symptoms interfere with home or work and you feel like your condition is not improving. Follow these instructions at home: Activity Exercise. Adults should: Exercise for at least 150 minutes each week. The exercise should increase your heart rate and make you sweat (moderate-intensity exercise). Do strengthening exercises at least twice a week. Get the right amount and quality of sleep. Most adults need 7-9 hours of sleep each night. Lifestyle  Eat a healthy diet that includes plenty of vegetables, fruits, whole grains, low-fat dairy products, and lean protein. Do not eat a lot of foods that are high in fats, added sugars, or salt  (sodium). Make choices that simplify your life. Do not use any products that contain nicotine or tobacco. These products include cigarettes, chewing tobacco, and vaping devices, such as e-cigarettes. If you need help quitting, ask your provider. Avoid caffeine, alcohol, and certain over-the-counter cold medicines. These may make you feel worse. Ask your pharmacist which medicines to avoid. General instructions Take over-the-counter and prescription medicines only as told by your provider. Keep all follow-up visits. This is to make sure you are managing your anxiety well or if you need more support. Where to find support You can get help and support from: Self-help groups. Online and [employer]. A trusted spiritual leader. Couples counseling. Family education classes. Family therapy. Where to find more information You may find that joining a support group helps you deal with your anxiety. The following sources can help you find counselors or support groups near you: Mental Health [place]: mentalhealthamerica.net Anxiety and [employer] Association of [place] (ADAA): adaa.org The [employer] Mental Illness (NAMI): nami.org Contact a health care provider if: You have a hard time staying focused or finishing tasks. You spend many hours a day feeling worried about everyday life. You are very tired because you cannot stop worrying. You start to have headaches or often feel tense. You have chronic nausea or diarrhea. Get help right away if: Your heart feels like it is racing. You have shortness of breath. You have thoughts of hurting yourself or others. Get help  right away if you feel like you may hurt yourself or others, or have thoughts about taking your own life. Go to your nearest emergency room or: Call 911. Call the National Suicide Prevention Lifeline at 765-482-1593 or 988. This is open 24 hours a day. Text the Crisis Text Line at 504 124 9896. This information is not  intended to replace advice given to you by your health care provider. Make sure you discuss any questions you have with your health care provider. Document Revised: 12/02/2021 Document Reviewed: 06/16/2020 Elsevier Patient Education  2024 [employer].

## 2023-05-26 ENCOUNTER — Encounter: Payer: Self-pay | Admitting: Surgery

## 2023-05-28 ENCOUNTER — Encounter: Payer: Self-pay | Admitting: Nurse Practitioner

## 2023-05-28 ENCOUNTER — Ambulatory Visit (INDEPENDENT_AMBULATORY_CARE_PROVIDER_SITE_OTHER): Payer: PPO | Admitting: Nurse Practitioner

## 2023-05-28 VITALS — BP 132/80 | HR 76 | Temp 98.5°F | Ht 61.7 in | Wt 89.0 lb

## 2023-05-28 DIAGNOSIS — Z0000 Encounter for general adult medical examination without abnormal findings: Secondary | ICD-10-CM | POA: Diagnosis not present

## 2023-05-28 DIAGNOSIS — F17210 Nicotine dependence, cigarettes, uncomplicated: Secondary | ICD-10-CM

## 2023-05-28 DIAGNOSIS — F411 Generalized anxiety disorder: Secondary | ICD-10-CM

## 2023-05-28 DIAGNOSIS — M8588 Other specified disorders of bone density and structure, other site: Secondary | ICD-10-CM

## 2023-05-28 DIAGNOSIS — Z79899 Other long term (current) drug therapy: Secondary | ICD-10-CM

## 2023-05-28 DIAGNOSIS — E782 Mixed hyperlipidemia: Secondary | ICD-10-CM

## 2023-05-28 DIAGNOSIS — R030 Elevated blood-pressure reading, without diagnosis of hypertension: Secondary | ICD-10-CM

## 2023-05-28 DIAGNOSIS — D692 Other nonthrombocytopenic purpura: Secondary | ICD-10-CM | POA: Diagnosis not present

## 2023-05-28 DIAGNOSIS — H401131 Primary open-angle glaucoma, bilateral, mild stage: Secondary | ICD-10-CM

## 2023-05-28 MED ORDER — SIMVASTATIN 20 MG PO TABS: 20.0000 mg | ORAL_TABLET | Freq: Every day | ORAL | 4 refills | Status: AC

## 2023-05-28 MED ORDER — ALPRAZOLAM 0.25 MG PO TABS: 0.2500 mg | ORAL_TABLET | Freq: Every evening | ORAL | 0 refills | Status: DC | PRN

## 2023-05-28 MED ORDER — ESCITALOPRAM OXALATE 10 MG PO TABS: 10.0000 mg | ORAL_TABLET | Freq: Every day | ORAL | 4 refills | Status: AC

## 2023-05-28 NOTE — Progress Notes (Signed)
 BP 132/80 (BP Location: Left Arm, Patient Position: Sitting, Cuff Size: Normal)   Pulse 76   Temp 98.5 F (36.9 C) (Oral)   Ht 5' 1.7" (1.567 m)   Wt 89 lb (40.4 kg)   SpO2 98%   BMI 16.44 kg/m    Subjective:    Patient ID: Brandy Parker, female    DOB: 10-May-1950, 73 y.o.   MRN: [id number]  HPI: CHAKA BOYSON is a 72 y.o. female presenting on 05/28/2023 for comprehensive medical examination. Current medical complaints include:none  She currently lives with: self Menopausal Symptoms: no  Had appendix removed on 04/20/23, was not feeling good at that time and there is 12 lbs weight loss.  OSTEOPENIA DEXA 02/11/22 with T-score -2.2.  Continues to take Vitamin D daily. No recent falls or fractures. Weight bearing exercises: yes  Supplement: Calcium and Vit D  HYPERLIPIDEMIA Continues Simvastatin 20 MG daily.  Current smoker, smokes about 5-6 cigarettes a day.  Started smoking in 20's.  Never been a 1 PPD smoker.  Not interested in quitting.  She is not interested in lung cancer screening.  Follows ophthalmology for glaucoma. Hyperlipidemia status: good compliance Satisfied with current treatment?  yes Side effects:  no Medication compliance: good compliance Supplements: none Aspirin:  no The 10-year ASCVD risk score (Arnett DK, et al., 2019) is: 21.2%   Values used to calculate the score:     Age: 26 years     Sex: Female     Is Non-Hispanic African American: No     Diabetic: No     Tobacco smoker: Yes     Systolic Blood Pressure: 132 mmHg     Is BP treated: No     HDL Cholesterol: 70 mg/dL     Total Cholesterol: 149 mg/dL Chest pain:  no Coronary artery disease:  no Family history CAD:  no Family history early CAD:  no   ANXIETY/STRESS Continues Xanax 0.25 MG PRN and Lexapro 10 MG daily.  Continues to take Xanax 5 nights a week.   Taken Xanax for many years, since 2008. Pt is aware of risks of benzo medication use to include increased sedation, respiratory  suppression, falls, dependence and cardiovascular events. Pt would like to continue treatment as benefit determined to outweigh risk.  PDMP review her last fill Xanax was 01/29/23 for 90 day supply. Duration:stable Anxious mood: no  Excessive worrying: no Irritability: no  Sweating: no Nausea: no Palpitations:no Hyperventilation: no Panic attacks: no Agoraphobia: no  Obscessions/compulsions: no Depressed mood: no    05/28/2023    1:14 PM 02/12/2023    1:19 PM 11/13/2022    1:07 PM 06/01/2022    1:31 PM 05/15/2022    1:15 PM  Depression screen PHQ 2/9  Decreased Interest 0 0 0 0 0  Down, Depressed, Hopeless 0 0 0 0 0  PHQ - 2 Score 0 0 0 0 0  Altered sleeping 0 0 0 0 0  Tired, decreased energy 0 0 0 0 0  Change in appetite 0 0 0 0 0  Feeling bad or failure about yourself  0 0 0 0 0  Trouble concentrating 0 0 0 0 0  Moving slowly or fidgety/restless 0 0 0 0 0  Suicidal thoughts 0 0 0 0 0  PHQ-9 Score 0 0 0 0 0  Difficult doing work/chores Not difficult at all  Not difficult at all Not difficult at all Not difficult at all  Anhedonia: no Weight changes:  no Insomnia: no  Hypersomnia: no Fatigue/loss of energy: no Feelings of worthlessness: no Feelings of guilt: no Impaired concentration/indecisiveness: no Suicidal ideations: no  Crying spells: no Recent Stressors/Life Changes: no   Relationship problems: no   Family stress: no     Financial stress: no    Job stress: no    Recent death/loss: no     Feb 22, 2023    1:19 PM 11/13/2022    1:08 PM 05/15/2022    1:15 PM 02/13/2022    1:27 PM  GAD 7 : Generalized Anxiety Score  Nervous, Anxious, on Edge 0 0 0 0  Control/stop worrying 0 0 0 0  Worry too much - different things 0 0 0 0  Trouble relaxing 0 0 0 0  Restless 0 0 0 0  Easily annoyed or irritable 0 0 0 0  Afraid - awful might happen 0 0 0 0  Total GAD 7 Score 0 0 0 0  Anxiety Difficulty  Not difficult at all Not difficult at all Not difficult at all      02/13/2022     1:26 PM 05/15/2022    1:14 PM 06/01/2022    1:33 PM 11/13/2022    1:07 PM 05/28/2023    1:14 PM  Fall Risk  Falls in the past year? 0 0 0 0 0  Was there an injury with Fall? 0 0 0 0 0  Fall Risk Category Calculator 0 0 0 0 0  Fall Risk Category (Retired) Low      Patient at Risk for Falls Due to No Fall Risks No Fall Risks No Fall Risks No Fall Risks No Fall Risks  Fall risk Follow up Falls evaluation completed Falls evaluation completed Falls prevention discussed;Falls evaluation completed Falls evaluation completed Falls evaluation completed    Functional Status Survey: Is the patient deaf or have difficulty hearing?: No Does the patient have difficulty seeing, even when wearing glasses/contacts?: No Does the patient have difficulty concentrating, remembering, or making decisions?: No Does the patient have difficulty walking or climbing stairs?: No Does the patient have difficulty dressing or bathing?: No Does the patient have difficulty doing errands alone such as visiting a doctor's office or shopping?: No    Past Medical History:  Past Medical History:  Diagnosis Date   Anxiety    Hyperlipemia     Surgical History:  Past Surgical History:  Procedure Laterality Date   ORIF ANKLE FRACTURE Left 07/20/2016   Procedure: OPEN REDUCTION INTERNAL FIXATION (ORIF) ANKLE FRACTURE;  Surgeon: Lyndle Herrlich, MD;  Location: [facility] ORS;  Service: Orthopedics;  Laterality: Left;   XI ROBOTIC LAPAROSCOPIC ASSISTED APPENDECTOMY N/A 04/20/2023   Procedure: XI ROBOTIC LAPAROSCOPIC ASSISTED APPENDECTOMY, OPEN ILEOCECECTOMY;  Surgeon: Sung Amabile, DO;  Location: [facility] ORS;  Service: General;  Laterality: N/A;    Medications:  Current Outpatient Medications on File Prior to Visit  Medication Sig   cholecalciferol (VITAMIN D3) 25 MCG (1000 UNIT) tablet Take 1,000 Units by mouth daily.   ibuprofen (ADVIL) 400 MG tablet Take 1 tablet (400 mg total) by mouth every 8 (eight) hours as needed for mild pain  (pain score 1-3) or moderate pain (pain score 4-6).   Multiple Vitamin (MULTIVITAMIN) tablet Take 1 tablet by mouth daily.   No current facility-administered medications on file prior to visit.    Allergies:  Allergies  Allergen Reactions   Sulfa Antibiotics Other (See Comments)    Per patient, unknown reaction    Social History:  Social  History   Socioeconomic History   Marital status: Single    Spouse name: Not on file   Number of children: Not on file   Years of education: Not on file   Highest education level: 12th grade  Occupational History   Occupation: fulltime     Employer: [employer]  Tobacco Use   Smoking status: Every Day    Current packs/day: 0.50    Average packs/day: 0.5 packs/day for 50.2 years (25.1 ttl pk-yrs)    Types: Cigarettes    Start date: 79   Smokeless tobacco: Never  Vaping Use   Vaping status: Never Used  Substance and Sexual Activity   Alcohol use: No   Drug use: No   Sexual activity: Not Currently  Other Topics Concern   Not on file  Social History Narrative   Not on file   Social Drivers of Health   Financial Resource Strain: Low Risk  (05/03/2023)   Received from [facility]   Overall Financial Resource Strain (CARDIA)    Difficulty of Paying Living Expenses: Not hard at all  Food Insecurity: No Food Insecurity (05/03/2023)   Received from [facility]   Hunger Vital Sign    Worried About Running Out of Food in the Last Year: Never true    Ran Out of Food in the Last Year: Never true  Transportation Needs: No Transportation Needs (05/03/2023)   Received from [facility] - Transportation    In the past 12 months, has lack of transportation kept you from medical appointments or from getting medications?: No    Lack of Transportation (Non-Medical): No  Physical Activity: Insufficiently Active (02/11/2023)   Exercise Vital Sign    Days of Exercise per Week: 5 days     Minutes of Exercise per Session: 10 min  Stress: No Stress Concern Present (02/11/2023)   [employer] of Occupational Health - Occupational Stress Questionnaire    Feeling of Stress : Not at all  Social Connections: Moderately Isolated (04/20/2023)   Social Connection and Isolation Panel [NHANES]    Frequency of Communication with Friends and Family: Once a week    Frequency of Social Gatherings with Friends and Family: Once a week    Attends Religious Services: 1 to 4 times per year    Active Member of [employer] or Organizations: No    Attends Engineer, structural: 1 to 4 times per year    Marital Status: Divorced  Catering manager Violence: Not At Risk (04/20/2023)   Humiliation, Afraid, Rape, and Kick questionnaire    Fear of Current or Ex-Partner: No    Emotionally Abused: No    Physically Abused: No    Sexually Abused: No   Social History   Tobacco Use  Smoking Status Every Day   Current packs/day: 0.50   Average packs/day: 0.5 packs/day for 50.2 years (25.1 ttl pk-yrs)   Types: Cigarettes   Start date: 1975  Smokeless Tobacco Never   Social History   Substance and Sexual Activity  Alcohol Use No    Family History:  Family History  Problem Relation Age of Onset   Hypertension Mother    Heart failure Mother    Hypertension Father    Hyperlipidemia Father    Cancer Father        bladder   Heart failure Father    Cancer Sister        bladder   Breast cancer Neg Hx  Past medical history, surgical history, medications, allergies, family history and social history reviewed with patient today and changes made to appropriate areas of the chart.   ROS  All other ROS negative except what is listed above and in the HPI.      Objective:    BP 132/80 (BP Location: Left Arm, Patient Position: Sitting, Cuff Size: Normal)   Pulse 76   Temp 98.5 F (36.9 C) (Oral)   Ht 5' 1.7" (1.567 m)   Wt 89 lb (40.4 kg)   SpO2 98%   BMI 16.44 kg/m   Wt Readings  from Last 3 Encounters:  05/28/23 89 lb (40.4 kg)  04/19/23 101 lb (45.8 kg)  02/12/23 101 lb 3.2 oz (45.9 kg)    Physical Exam Vitals and nursing note reviewed. Exam conducted with a chaperone present.  Constitutional:      General: She is awake. She is not in acute distress.    Appearance: She is well-developed and well-groomed. She is not ill-appearing or toxic-appearing.  HENT:     Head: Normocephalic and atraumatic.     Right Ear: Hearing, tympanic membrane, ear canal and external ear normal. No drainage.     Left Ear: Hearing, tympanic membrane, ear canal and external ear normal. No drainage.     Nose: Nose normal.     Right Sinus: No maxillary sinus tenderness or frontal sinus tenderness.     Left Sinus: No maxillary sinus tenderness or frontal sinus tenderness.     Mouth/Throat:     Mouth: Mucous membranes are moist.     Pharynx: Oropharynx is clear. Uvula midline. No pharyngeal swelling, oropharyngeal exudate or posterior oropharyngeal erythema.  Eyes:     General: Lids are normal.        Right eye: No discharge.        Left eye: No discharge.     Extraocular Movements: Extraocular movements intact.     Conjunctiva/sclera: Conjunctivae normal.     Pupils: Pupils are equal, round, and reactive to light.     Visual Fields: Right eye visual fields normal and left eye visual fields normal.  Neck:     Thyroid: No thyromegaly.     Vascular: No carotid bruit.     Trachea: Trachea normal.  Cardiovascular:     Rate and Rhythm: Normal rate and regular rhythm.     Heart sounds: Normal heart sounds. No murmur heard.    No gallop.  Pulmonary:     Effort: Pulmonary effort is normal. No accessory muscle usage or respiratory distress.     Breath sounds: Normal breath sounds.  Chest:  Breasts:    Right: Normal.     Left: Normal.  Abdominal:     General: Bowel sounds are normal.     Palpations: Abdomen is soft. There is no hepatomegaly or splenomegaly.     Tenderness: There is no  abdominal tenderness.  Musculoskeletal:        General: Normal range of motion.     Cervical back: Normal range of motion and neck supple.     Right lower leg: No edema.     Left lower leg: No edema.  Lymphadenopathy:     Head:     Right side of head: No submental, submandibular, tonsillar, preauricular or posterior auricular adenopathy.     Left side of head: No submental, submandibular, tonsillar, preauricular or posterior auricular adenopathy.     Cervical: No cervical adenopathy.     Upper Body:  Right upper body: No supraclavicular, axillary or pectoral adenopathy.     Left upper body: No supraclavicular, axillary or pectoral adenopathy.  Skin:    General: Skin is warm and dry.     Capillary Refill: Capillary refill takes less than 2 seconds.     Findings: No rash.     Comments: Scattered small bruises to upper extremities.  Neurological:     Mental Status: She is alert and oriented to person, place, and time.     Gait: Gait is intact.     Deep Tendon Reflexes: Reflexes are normal and symmetric.     Reflex Scores:      Brachioradialis reflexes are 2+ on the right side and 2+ on the left side.      Patellar reflexes are 2+ on the right side and 2+ on the left side. Psychiatric:        Attention and Perception: Attention normal.        Mood and Affect: Mood normal.        Speech: Speech normal.        Behavior: Behavior normal. Behavior is cooperative.        Thought Content: Thought content normal.        Judgment: Judgment normal.    Results for orders placed or performed during the hospital encounter of 04/19/23  CBC with Differential   Collection Time: 04/19/23  5:16 PM  Result Value Ref Range   WBC 17.9 (H) 4.0 - 10.5 K/uL   RBC 4.25 3.87 - 5.11 MIL/uL   Hemoglobin 13.4 12.0 - 15.0 g/dL   HCT 95.1 88.4 - 16.6 %   MCV 92.2 80.0 - 100.0 fL   MCH 31.5 26.0 - 34.0 pg   MCHC 34.2 30.0 - 36.0 g/dL   RDW 06.3 01.6 - 01.0 %   Platelets 344 150 - 400 K/uL   nRBC 0.0  0.0 - 0.2 %   Neutrophils Relative % 92 %   Neutro Abs 16.4 (H) 1.7 - 7.7 K/uL   Lymphocytes Relative 5 %   Lymphs Abs 0.9 0.7 - 4.0 K/uL   Monocytes Relative 2 %   Monocytes Absolute 0.4 0.1 - 1.0 K/uL   Eosinophils Relative 0 %   Eosinophils Absolute 0.1 0.0 - 0.5 K/uL   Basophils Relative 0 %   Basophils Absolute 0.0 0.0 - 0.1 K/uL   Immature Granulocytes 1 %   Abs Immature Granulocytes 0.11 (H) 0.00 - 0.07 K/uL  Comprehensive metabolic panel   Collection Time: 04/19/23  5:16 PM  Result Value Ref Range   Sodium 137 135 - 145 mmol/L   Potassium 4.6 3.5 - 5.1 mmol/L   Chloride 97 (L) 98 - 111 mmol/L   CO2 24 22 - 32 mmol/L   Glucose, Bld 94 70 - 99 mg/dL   BUN 30 (H) 8 - 23 mg/dL   Creatinine, Ser 9.32 (H) 0.44 - 1.00 mg/dL   Calcium 9.3 8.9 - 35.5 mg/dL   Total Protein 7.2 6.5 - 8.1 g/dL   Albumin 3.9 3.5 - 5.0 g/dL   AST 24 15 - 41 U/L   ALT 12 0 - 44 U/L   Alkaline Phosphatase 39 38 - 126 U/L   Total Bilirubin 0.6 0.0 - 1.2 mg/dL   GFR, Estimated 41 (L) >60 mL/min   Anion gap 16 (H) 5 - 15  Urinalysis, Routine w reflex microscopic -Urine, Clean Catch   Collection Time: 04/19/23  5:16 PM  Result Value Ref Range  Color, Urine AMBER (A) YELLOW   APPearance HAZY (A) CLEAR   Specific Gravity, Urine 1.019 1.005 - 1.030   pH 5.0 5.0 - 8.0   Glucose, UA 50 (A) NEGATIVE mg/dL   Hgb urine dipstick NEGATIVE NEGATIVE   Bilirubin Urine NEGATIVE NEGATIVE   Ketones, ur 5 (A) NEGATIVE mg/dL   Protein, ur 30 (A) NEGATIVE mg/dL   Nitrite NEGATIVE NEGATIVE   Leukocytes,Ua NEGATIVE NEGATIVE   RBC / HPF 0-5 0 - 5 RBC/hpf   WBC, UA 0-5 0 - 5 WBC/hpf   Bacteria, UA NONE SEEN NONE SEEN   Squamous Epithelial / HPF 0-5 0 - 5 /HPF   Mucus PRESENT    Hyaline Casts, UA PRESENT   Surgical pathology   Collection Time: 04/20/23 12:00 AM  Result Value Ref Range   SURGICAL PATHOLOGY      SURGICAL PATHOLOGY [facility] [address], [address] [place], [place]  [address] Telephone [phone] or [phone] Fax [phone]  REPORT OF SURGICAL PATHOLOGY   Accession #: [id number] Patient Name: MARGIA, WIESEN Visit # : [id number]  MRN: [id number] Physician: Sung Amabile DOB/Age [birth year] (Age: 28) Gender: F Collected Date: 04/20/2023 Received Date: 04/20/2023  FINAL DIAGNOSIS       1. Appendix, Other than Incidental,  :       ACUTE APPENDICITIS WITH TRANSMURAL INFLAMMATION, PARAAPPENDICITIS AND SEROSITIS.       2. Colon, segmental resection, cecum :       SEGMENT OF COLON AND SMALL INTESTINE WITH ROBUST ACUTE SEROSITIS.      NEGATIVE FOR DYSPLASIA OR MALIGNANCY.       ELECTRONIC SIGNATURE : Lance Coon Md, Pathologist, Electronic Signature  MICROSCOPIC DESCRIPTION  CASE COMMENTS STAINS USED IN DIAGNOSIS: H&E H&E H&E H&E H&E H&E H&E    CLINICAL HISTORY  SPECIMEN(S) OBTAINED 1. Appendix, Other than Incid ental, 2. Colon, segmental resection, Cecum  SPECIMEN COMMENTS: SPECIMEN CLINICAL INFORMATION: 1. Acute appendicitis    Gross Description 1. Specimen: "Appendix"; vermiform, disrupted at the proximal end; received fresh and placed in formalin      Size: 8.8 cm long, up to 2.0 cm diameter; 7.8 x 2.3 x 1.5 cm portion of attached      adipose tissue      Serosa: Pink-tan with a moderate amount of white-tan exudate and focal areas of      hemorrhagic discoloration. The staple line is removed under inked black.      Mucosa: Pink-tan and soft without discrete lesions.      Wall: White-pink, solid, and homogenous; 0.3 -0.6 cm thick      Lumen: Contains a mild amount of soft, well-formed, brown fecal material without      discrete fecaliths.      Block Summary:      1A: Margin, en face      1B-C: Central sections, proximal to distal (representative)      1D: Distal tip, trisected (entire) 2. "Cecum", received is an intact portion of ileum and cecum that is stapled at b oth ends:      Terminal ileum: 3.0 cm  long, 2.8 cm diameter; pink-tan, smooth serosa and a      tan-pink mucosa that has a normal folding pattern. Discrete lesions are grossly      absent.      Cecum: 6.0 cm long, 9.3 cm diameter. The serosa has a moderate amount of      tan-white exudate and hemorrhagic discoloration. The lumen contains a  moderate      amount of green, soft fecal material. The mucosa is tan-pink and has normal      folding pattern without discrete lesions. The wall is moderately edematous and      up to 0.7 cm thick.      Block summary:      2A: Terminal ileum, representative      2B-C: Cecum, representative      AMG 04/20/2023        Report signed out from the following location(s) [place] [address], [place] [address] CLIA #: [id number]  [facility] [address] [place], [place] [address] CLIA #: [id number]   Lipase, blood   Collection Time: 04/20/23 12:36 AM  Result Value Ref Range   Lipase 29 11 - 51 U/L  CBC   Collection Time: 04/21/23  6:24 AM  Result Value Ref Range   WBC 14.4 (H) 4.0 - 10.5 K/uL   RBC 3.41 (L) 3.87 - 5.11 MIL/uL   Hemoglobin 10.8 (L) 12.0 - 15.0 g/dL   HCT 59.5 (L) 63.8 - 75.6 %   MCV 95.9 80.0 - 100.0 fL   MCH 31.7 26.0 - 34.0 pg   MCHC 33.0 30.0 - 36.0 g/dL   RDW 43.3 29.5 - 18.8 %   Platelets 287 150 - 400 K/uL   nRBC 0.0 0.0 - 0.2 %  Basic metabolic panel   Collection Time: 04/21/23  6:24 AM  Result Value Ref Range   Sodium 139 135 - 145 mmol/L   Potassium 4.0 3.5 - 5.1 mmol/L   Chloride 109 98 - 111 mmol/L   CO2 20 (L) 22 - 32 mmol/L   Glucose, Bld 68 (L) 70 - 99 mg/dL   BUN 26 (H) 8 - 23 mg/dL   Creatinine, Ser 4.16 0.44 - 1.00 mg/dL   Calcium 8.1 (L) 8.9 - 10.3 mg/dL   GFR, Estimated >60 >63 mL/min   Anion gap 10 5 - 15  CBC   Collection Time: 04/22/23  7:17 AM  Result Value Ref Range   WBC 12.6 (H) 4.0 - 10.5 K/uL   RBC 3.68 (L) 3.87 - 5.11 MIL/uL   Hemoglobin 11.6 (L) 12.0 - 15.0 g/dL    HCT 01.6 (L) 01.0 - 46.0 %   MCV 93.8 80.0 - 100.0 fL   MCH 31.5 26.0 - 34.0 pg   MCHC 33.6 30.0 - 36.0 g/dL   RDW 93.2 35.5 - 73.2 %   Platelets 353 150 - 400 K/uL   nRBC 0.0 0.0 - 0.2 %  Basic metabolic panel   Collection Time: 04/22/23  7:17 AM  Result Value Ref Range   Sodium 137 135 - 145 mmol/L   Potassium 3.9 3.5 - 5.1 mmol/L   Chloride 105 98 - 111 mmol/L   CO2 23 22 - 32 mmol/L   Glucose, Bld 109 (H) 70 - 99 mg/dL   BUN 18 8 - 23 mg/dL   Creatinine, Ser 2.02 0.44 - 1.00 mg/dL   Calcium 8.3 (L) 8.9 - 10.3 mg/dL   GFR, Estimated >54 >27 mL/min   Anion gap 9 5 - 15  CBC   Collection Time: 04/23/23  6:06 AM  Result Value Ref Range   WBC 8.7 4.0 - 10.5 K/uL   RBC 3.81 (L) 3.87 - 5.11 MIL/uL   Hemoglobin 12.0 12.0 - 15.0 g/dL   HCT 06.2 (L) 37.6 - 28.3 %   MCV 90.8 80.0 -  100.0 fL   MCH 31.5 26.0 - 34.0 pg   MCHC 34.7 30.0 - 36.0 g/dL   RDW 70.3 50.0 - 93.8 %   Platelets 361 150 - 400 K/uL   nRBC 0.0 0.0 - 0.2 %  Basic metabolic panel   Collection Time: 04/23/23  6:06 AM  Result Value Ref Range   Sodium 136 135 - 145 mmol/L   Potassium 3.9 3.5 - 5.1 mmol/L   Chloride 103 98 - 111 mmol/L   CO2 23 22 - 32 mmol/L   Glucose, Bld 115 (H) 70 - 99 mg/dL   BUN 15 8 - 23 mg/dL   Creatinine, Ser 1.82 0.44 - 1.00 mg/dL   Calcium 8.6 (L) 8.9 - 10.3 mg/dL   GFR, Estimated >99 >37 mL/min   Anion gap 10 5 - 15  CBC   Collection Time: 04/24/23  5:54 AM  Result Value Ref Range   WBC 9.0 4.0 - 10.5 K/uL   RBC 3.71 (L) 3.87 - 5.11 MIL/uL   Hemoglobin 11.6 (L) 12.0 - 15.0 g/dL   HCT 16.9 (L) 67.8 - 93.8 %   MCV 92.2 80.0 - 100.0 fL   MCH 31.3 26.0 - 34.0 pg   MCHC 33.9 30.0 - 36.0 g/dL   RDW 10.1 75.1 - 02.5 %   Platelets 386 150 - 400 K/uL   nRBC 0.0 0.0 - 0.2 %  Basic metabolic panel   Collection Time: 04/24/23  5:54 AM  Result Value Ref Range   Sodium 136 135 - 145 mmol/L   Potassium 3.8 3.5 - 5.1 mmol/L   Chloride 105 98 - 111 mmol/L   CO2 21 (L) 22 - 32 mmol/L    Glucose, Bld 81 70 - 99 mg/dL   BUN 17 8 - 23 mg/dL   Creatinine, Ser 8.52 0.44 - 1.00 mg/dL   Calcium 8.8 (L) 8.9 - 10.3 mg/dL   GFR, Estimated >77 >82 mL/min   Anion gap 10 5 - 15  CBC   Collection Time: 04/25/23  6:16 AM  Result Value Ref Range   WBC 10.6 (H) 4.0 - 10.5 K/uL   RBC 3.81 (L) 3.87 - 5.11 MIL/uL   Hemoglobin 11.9 (L) 12.0 - 15.0 g/dL   HCT 42.3 (L) 53.6 - 14.4 %   MCV 91.6 80.0 - 100.0 fL   MCH 31.2 26.0 - 34.0 pg   MCHC 34.1 30.0 - 36.0 g/dL   RDW 31.5 40.0 - 86.7 %   Platelets 423 (H) 150 - 400 K/uL   nRBC 0.0 0.0 - 0.2 %  Basic metabolic panel   Collection Time: 04/25/23  6:16 AM  Result Value Ref Range   Sodium 139 135 - 145 mmol/L   Potassium 3.8 3.5 - 5.1 mmol/L   Chloride 104 98 - 111 mmol/L   CO2 25 22 - 32 mmol/L   Glucose, Bld 90 70 - 99 mg/dL   BUN 14 8 - 23 mg/dL   Creatinine, Ser 6.19 0.44 - 1.00 mg/dL   Calcium 8.9 8.9 - 50.9 mg/dL   GFR, Estimated >32 >67 mL/min   Anion gap 10 5 - 15  CBC   Collection Time: 04/26/23  6:46 AM  Result Value Ref Range   WBC 10.3 4.0 - 10.5 K/uL   RBC 3.74 (L) 3.87 - 5.11 MIL/uL   Hemoglobin 11.8 (L) 12.0 - 15.0 g/dL   HCT 12.4 (L) 58.0 - 99.8 %   MCV 91.7 80.0 - 100.0 fL   MCH  31.6 26.0 - 34.0 pg   MCHC 34.4 30.0 - 36.0 g/dL   RDW 16.1 09.6 - 04.5 %   Platelets 438 (H) 150 - 400 K/uL   nRBC 0.0 0.0 - 0.2 %  Basic metabolic panel   Collection Time: 04/26/23  6:46 AM  Result Value Ref Range   Sodium 138 135 - 145 mmol/L   Potassium 3.8 3.5 - 5.1 mmol/L   Chloride 104 98 - 111 mmol/L   CO2 27 22 - 32 mmol/L   Glucose, Bld 99 70 - 99 mg/dL   BUN 16 8 - 23 mg/dL   Creatinine, Ser 4.09 0.44 - 1.00 mg/dL   Calcium 8.9 8.9 - 81.1 mg/dL   GFR, Estimated >91 >47 mL/min   Anion gap 7 5 - 15      Assessment & Plan:   Problem List Items Addressed This Visit       Cardiovascular and Mediastinum   Senile purpura (HCC) - Primary   Noted on exam, upper extremities.  Recommend monitor skin closely for  breakdown and alert provider if present.  Gentle skin care at home.  CBC on labs today.      Relevant Medications   simvastatin (ZOCOR) 20 MG tablet   Other Relevant Orders   CBC with Differential/Platelet     Musculoskeletal and Integument   Osteopenia of lumbar spine   Chronic, stable.  Continue Vitamin D and check level today.  DEXA repeat in around 02/12/2027.      Relevant Orders   VITAMIN D 25 Hydroxy (Vit-D Deficiency, Fractures)     Other   Elevated BP without diagnosis of hypertension   Noted today at visit, with trend down one recheck to her baseline.  She will focus on diet changes, DASH, and regular activity.  Check BP 3 days a week at home and bring log to visit.  If ongoing elevations next visit will start ARB, avoid ACE due to smoking, educated her on this medication category and side effects.  She is agreeable to this plan.  Recommend cut back on smoking.      Relevant Orders   Comprehensive metabolic panel   TSH   Generalized anxiety disorder   Chronic, stable.  Only taking Xanax 0.25 MG at night 5 days a week.  Have sent in refill 0.25 MG QHS #90 tablets with 0 refills recently.  Had discussion about BEERS criteria and risks with these.   Will continue to monitor, she is up to date on contract and UDS due 05/27/24 next.  Continue Lexapro.  She agrees to every 3 month visits, will return in 3 months.      Relevant Medications   escitalopram (LEXAPRO) 10 MG tablet   ALPRAZolam (XANAX) 0.25 MG tablet   Other Relevant Orders   [id number] 11+Oxyco+Alc+Crt-Bund   Hyperlipidemia   Chronic, ongoing.  Continue current medication regimen and adjust as needed.  Lipid panel today.      Relevant Medications   simvastatin (ZOCOR) 20 MG tablet   Other Relevant Orders   Comprehensive metabolic panel   Lipid Panel w/o Chol/HDL Ratio   Long-term current use of benzodiazepine   Refer to anxiety plan of care.      Relevant Orders   [id number] 11+Oxyco+Alc+Crt-Bund   Nicotine  dependence, cigarettes, uncomplicated   I have recommended complete cessation of tobacco use. I have discussed various options available for assistance with tobacco cessation including over the counter methods (Nicotine gum, patch and lozenges). We also discussed  prescription options (Chantix, Nicotine Inhaler / Nasal Spray). The patient is not interested in pursuing any prescription tobacco cessation options at this time.  Does not want to obtain lung cancer screening.       Primary open-angle glaucoma, bilateral, mild stage   Ongoing, continue collaboration with ophthalmology.      Other Visit Diagnoses       High risk medication use       UDS on labs today due to Xanax use.   Relevant Orders   [id number] 11+Oxyco+Alc+Crt-Bund     Encounter for annual physical exam       Annual physical today with labs and health maintenance reviewed, discussed with patient.        Follow up plan: Return in about 3 months (around 08/28/2023) for ANXIETY.   LABORATORY TESTING:  - Pap smear: not applicable  IMMUNIZATIONS:   - Tdap: Tetanus vaccination status reviewed: last tetanus booster within 10 years. - Influenza: Refused -- will hold off until new year - Pneumovax: Up to date - Prevnar: Up to date - COVID: Up to date - HPV: Not applicable - Shingrix vaccine: Refused  SCREENING: -Mammogram: Up to date  - Colonoscopy: Wishes to think about this -- will alert provider if would like referral - Bone Density: Up to date -- due next 02/12/2027 -Hearing Test: Up to date  -Spirometry: Not applicable   PATIENT COUNSELING:    Advised to avoid cigarette smoking.    Diet: Encouraged to adjust caloric intake to maintain  or achieve ideal body weight, to reduce intake of dietary saturated fat and total fat, to limit sodium intake by avoiding high sodium foods and not adding table salt, and to maintain adequate dietary potassium and calcium preferably from fresh fruits, vegetables, and low-fat dairy  products.    Stressed the importance of regular exercise  Injury prevention: Discussed safety belts, safety helmets, smoke detector, smoking near bedding or upholstery.   Dental health: Discussed importance of regular tooth brushing, flossing, and dental visits.    NEXT PREVENTATIVE PHYSICAL DUE IN 1 YEAR. Return in about 3 months (around 08/28/2023) for ANXIETY.

## 2023-05-28 NOTE — Assessment & Plan Note (Signed)
 Chronic, stable.  Only taking Xanax 0.25 MG at night 5 days a week.  Have sent in refill 0.25 MG QHS #90 tablets with 0 refills recently.  Had discussion about BEERS criteria and risks with these.   Will continue to monitor, she is up to date on contract and UDS due 05/27/24 next.  Continue Lexapro.  She agrees to every 3 month visits, will return in 3 months.

## 2023-05-28 NOTE — Assessment & Plan Note (Signed)
Noted on exam, upper extremities.  Recommend monitor skin closely for breakdown and alert provider if present.  Gentle skin care at home.  CBC on labs today.

## 2023-05-28 NOTE — Assessment & Plan Note (Signed)
Noted today at visit, with trend down one recheck to her baseline.  She will focus on diet changes, DASH, and regular activity.  Check BP 3 days a week at home and bring log to visit.  If ongoing elevations next visit will start ARB, avoid ACE due to smoking, educated her on this medication category and side effects.  She is agreeable to this plan.  Recommend cut back on smoking.

## 2023-05-28 NOTE — Assessment & Plan Note (Signed)
 Chronic, ongoing.  Continue current medication regimen and adjust as needed. Lipid panel today.

## 2023-05-28 NOTE — Assessment & Plan Note (Signed)
 Refer to anxiety plan of care.

## 2023-05-28 NOTE — Assessment & Plan Note (Signed)
 Ongoing, continue collaboration with ophthalmology.

## 2023-05-28 NOTE — Assessment & Plan Note (Signed)
I have recommended complete cessation of tobacco use. I have discussed various options available for assistance with tobacco cessation including over the counter methods (Nicotine gum, patch and lozenges). We also discussed prescription options (Chantix, Nicotine Inhaler / Nasal Spray). The patient is not interested in pursuing any prescription tobacco cessation options at this time.  Does not want to obtain lung cancer screening.

## 2023-05-28 NOTE — Assessment & Plan Note (Signed)
 Chronic, stable.  Continue Vitamin D and check level today.  DEXA repeat in around 02/12/2027.

## 2023-05-29 ENCOUNTER — Encounter: Payer: Self-pay | Admitting: Nurse Practitioner

## 2023-05-29 LAB — COMPREHENSIVE METABOLIC PANEL
ALT: 7 IU/L (ref 0–32)
AST: 20 IU/L (ref 0–40)
Albumin: 4.6 g/dL (ref 3.8–4.8)
Alkaline Phosphatase: 61 IU/L (ref 44–121)
BUN/Creatinine Ratio: 21 (ref 12–28)
BUN: 20 mg/dL (ref 8–27)
Bilirubin Total: 0.2 mg/dL (ref 0.0–1.2)
CO2: 23 mmol/L (ref 20–29)
Calcium: 10 mg/dL (ref 8.7–10.3)
Chloride: 101 mmol/L (ref 96–106)
Creatinine, Ser: 0.94 mg/dL (ref 0.57–1.00)
Globulin, Total: 2.5 g/dL (ref 1.5–4.5)
Glucose: 88 mg/dL (ref 70–99)
Potassium: 4.5 mmol/L (ref 3.5–5.2)
Sodium: 141 mmol/L (ref 134–144)
Total Protein: 7.1 g/dL (ref 6.0–8.5)
eGFR: 64 mL/min/{1.73_m2} (ref >59)

## 2023-05-29 LAB — CBC WITH DIFFERENTIAL/PLATELET
Basophils Absolute: 0.1 10*3/uL (ref 0.0–0.2)
Basos: 1 %
EOS (ABSOLUTE): 0 10*3/uL (ref 0.0–0.4)
Eos: 1 %
Hematocrit: 35.7 % (ref 34.0–46.6)
Hemoglobin: 11.5 g/dL (ref 11.1–15.9)
Immature Grans (Abs): 0 10*3/uL (ref 0.0–0.1)
Immature Granulocytes: 0 %
Lymphocytes Absolute: 2.2 10*3/uL (ref 0.7–3.1)
Lymphs: 34 %
MCH: 31.2 pg (ref 26.6–33.0)
MCHC: 32.2 g/dL (ref 31.5–35.7)
MCV: 97 fL (ref 79–97)
Monocytes Absolute: 0.4 10*3/uL (ref 0.1–0.9)
Monocytes: 6 %
Neutrophils Absolute: 3.9 10*3/uL (ref 1.4–7.0)
Neutrophils: 58 %
Platelets: 391 10*3/uL (ref 150–450)
RBC: 3.69 x10E6/uL — ABNORMAL LOW (ref 3.77–5.28)
RDW: 13.2 % (ref 11.7–15.4)
WBC: 6.5 10*3/uL (ref 3.4–10.8)

## 2023-05-29 LAB — LIPID PANEL W/O CHOL/HDL RATIO
Cholesterol, Total: 190 mg/dL (ref 100–199)
HDL: 79 mg/dL (ref >39)
LDL Chol Calc (NIH): 92 mg/dL (ref 0–99)
Triglycerides: 109 mg/dL (ref 0–149)
VLDL Cholesterol Cal: 19 mg/dL (ref 5–40)

## 2023-05-29 LAB — VITAMIN D 25 HYDROXY (VIT D DEFICIENCY, FRACTURES): Vit D, 25-Hydroxy: 77.3 ng/mL (ref 30.0–100.0)

## 2023-05-29 LAB — TSH: TSH: 0.899 u[IU]/mL (ref 0.450–4.500)

## 2023-05-29 NOTE — Progress Notes (Signed)
 Contacted via MyChart   Good evening Brandy Parker, your labs have returned: - CBC shows no anemia or infection. - Kidney function, creatinine and eGFR, remains normal, as is liver function, AST and ALT.  - Thyroid and Vitamin D levels normal. - Lipid panel shows LDL >70 this check. Were you fasting? Are you taking your statin daily?  Let me know.  We may need to increase Simvastatin dose.  Any questions? Keep being amazing!!  Thank you for allowing me to participate in your care.  I appreciate you. Kindest regards, Jnae Thomaston

## 2023-05-30 LAB — DRUG SCREEN 764883 11+OXYCO+ALC+CRT-BUND

## 2023-06-02 LAB — DRUG SCREEN 764883 11+OXYCO+ALC+CRT-BUND
Creatinine: 153 mg/dL (ref 20.0–300.0)
pH, Urine: 5.6 (ref 4.5–8.9)

## 2023-06-02 LAB — BENZODIAZEPINES CONFIRM, URINE
Alprazolam Conf.: 125 ng/mL
Alprazolam: POSITIVE — AB
Benzodiazepines: POSITIVE ng/mL — AB
Clonazepam: NEGATIVE
Flurazepam: NEGATIVE
Lorazepam: NEGATIVE
Midazolam: NEGATIVE
Nordiazepam: NEGATIVE
Oxazepam: NEGATIVE
Temazepam: NEGATIVE
Triazolam: NEGATIVE

## 2023-06-07 ENCOUNTER — Ambulatory Visit: Payer: Self-pay

## 2023-06-07 ENCOUNTER — Emergency Department

## 2023-06-07 ENCOUNTER — Other Ambulatory Visit: Payer: Self-pay

## 2023-06-07 ENCOUNTER — Emergency Department
Admission: EM | Admit: 2023-06-07 | Discharge: 2023-06-07 | Disposition: A | Discharge status: Home / Self Care | Attending: Emergency Medicine | Admitting: Emergency Medicine

## 2023-06-07 ENCOUNTER — Ambulatory Visit: Payer: PPO

## 2023-06-07 VITALS — BP 132/80 | Ht 61.0 in | Wt 89.0 lb

## 2023-06-07 DIAGNOSIS — R0981 Nasal congestion: Secondary | ICD-10-CM | POA: Insufficient documentation

## 2023-06-07 DIAGNOSIS — K5792 Diverticulitis of intestine, part unspecified, without perforation or abscess without bleeding: Secondary | ICD-10-CM | POA: Insufficient documentation

## 2023-06-07 DIAGNOSIS — Z0000 Encounter for general adult medical examination without abnormal findings: Secondary | ICD-10-CM

## 2023-06-07 DIAGNOSIS — R1033 Periumbilical pain: Secondary | ICD-10-CM | POA: Diagnosis present

## 2023-06-07 LAB — CBC
HCT: 40.1 % (ref 36.0–46.0)
Hemoglobin: 12.9 g/dL (ref 12.0–15.0)
MCH: 32 pg (ref 26.0–34.0)
MCHC: 32.2 g/dL (ref 30.0–36.0)
MCV: 99.5 fL (ref 80.0–100.0)
Platelets: 354 10*3/uL (ref 150–400)
RBC: 4.03 MIL/uL (ref 3.87–5.11)
RDW: 14 % (ref 11.5–15.5)
WBC: 11 10*3/uL — ABNORMAL HIGH (ref 4.0–10.5)
nRBC: 0 % (ref 0.0–0.2)

## 2023-06-07 LAB — URINALYSIS, ROUTINE W REFLEX MICROSCOPIC
Bilirubin Urine: NEGATIVE
Glucose, UA: NEGATIVE mg/dL
Hgb urine dipstick: NEGATIVE
Ketones, ur: 20 mg/dL — AB
Leukocytes,Ua: NEGATIVE
Nitrite: NEGATIVE
Protein, ur: NEGATIVE mg/dL
Specific Gravity, Urine: 1.012 (ref 1.005–1.030)
pH: 8 (ref 5.0–8.0)

## 2023-06-07 LAB — COMPREHENSIVE METABOLIC PANEL WITH GFR
ALT: 12 U/L (ref 0–44)
AST: 22 U/L (ref 15–41)
Albumin: 4.1 g/dL (ref 3.5–5.0)
Alkaline Phosphatase: 45 U/L (ref 38–126)
Anion gap: 11 (ref 5–15)
BUN: 19 mg/dL (ref 8–23)
CO2: 25 mmol/L (ref 22–32)
Calcium: 9.1 mg/dL (ref 8.9–10.3)
Chloride: 102 mmol/L (ref 98–111)
Creatinine, Ser: 0.84 mg/dL (ref 0.44–1.00)
GFR, Estimated: >60 mL/min (ref >60)
Glucose, Bld: 131 mg/dL — ABNORMAL HIGH (ref 70–99)
Potassium: 3.9 mmol/L (ref 3.5–5.1)
Sodium: 138 mmol/L (ref 135–145)
Total Bilirubin: 0.7 mg/dL (ref 0.0–1.2)
Total Protein: 7.1 g/dL (ref 6.5–8.1)

## 2023-06-07 LAB — RESP PANEL BY RT-PCR (RSV, FLU A&B, COVID)  RVPGX2
Influenza A by PCR: NEGATIVE
Influenza B by PCR: NEGATIVE
Resp Syncytial Virus by PCR: NEGATIVE
SARS Coronavirus 2 by RT PCR: NEGATIVE

## 2023-06-07 LAB — LIPASE, BLOOD: Lipase: 42 U/L (ref 11–51)

## 2023-06-07 MED ORDER — AMOXICILLIN-POT CLAVULANATE 875-125 MG PO TABS: 1.0000 | ORAL_TABLET | Freq: Two times a day (BID) | ORAL | 0 refills | Status: AC

## 2023-06-07 MED ORDER — MORPHINE SULFATE (PF) 4 MG/ML IV SOLN
4.0000 mg | Freq: Once | INTRAVENOUS | Status: AC
  Administered 2023-06-07: 4 mg via INTRAVENOUS
  Filled 2023-06-07: qty 1

## 2023-06-07 MED ORDER — AMOXICILLIN-POT CLAVULANATE 875-125 MG PO TABS
1.0000 | ORAL_TABLET | Freq: Once | ORAL | Status: AC
  Administered 2023-06-07: 1 via ORAL
  Filled 2023-06-07: qty 1

## 2023-06-07 MED ORDER — IOHEXOL 300 MG/ML  SOLN
60.0000 mL | Freq: Once | INTRAMUSCULAR | Status: AC | PRN
  Administered 2023-06-07: 60 mL via INTRAVENOUS

## 2023-06-07 MED ORDER — ONDANSETRON HCL 4 MG/2ML IJ SOLN
4.0000 mg | Freq: Once | INTRAMUSCULAR | Status: AC
  Administered 2023-06-07: 4 mg via INTRAVENOUS
  Filled 2023-06-07: qty 2

## 2023-06-07 MED ORDER — ONDANSETRON HCL 4 MG PO TABS: 4.0000 mg | ORAL_TABLET | Freq: Three times a day (TID) | ORAL | 0 refills | Status: DC | PRN

## 2023-06-07 MED ORDER — SODIUM CHLORIDE 0.9 % IV BOLUS
1000.0000 mL | Freq: Once | INTRAVENOUS | Status: AC
  Administered 2023-06-07: 1000 mL via INTRAVENOUS

## 2023-06-07 NOTE — ED Notes (Signed)
 Patient back from CT.

## 2023-06-07 NOTE — Patient Instructions (Signed)
 [name] , Thank you for taking time to come for your Medicare Wellness Visit. I appreciate your ongoing commitment to your health goals. Please review the following plan we discussed and let me know if I can assist you in the future.   Referrals/Orders/Follow-Ups/Clinician Recommendations: [place] up in one year for next AWV.  This is a list of the screening recommended for you and due dates:  Health Maintenance  Topic Date Due   COVID-19 Vaccine (5 - 2024-25 season) 06/08/2023*   Zoster (Shingles) Vaccine (1 of 2) 08/28/2023*   Screening for Lung Cancer  05/22/2024*   Colon Cancer Screening  05/27/2024*   Medicare Annual Wellness Visit  06/06/2024   Mammogram  02/15/2025   DEXA scan (bone density measurement)  02/12/2027   DTaP/Tdap/Td vaccine (2 - Td or Tdap) 12/14/2029   Pneumonia Vaccine  Completed   Flu Shot  Completed   Hepatitis C Screening  Completed   HPV Vaccine  Aged Out  *Topic was postponed. The date shown is not the original due date.    Advanced directives: (Declined) Advance directive discussed with you today. Even though you declined this today, please call our office should you change your mind, and we can give you the proper paperwork for you to fill out.  Next Medicare Annual Wellness Visit scheduled for next year: Yes

## 2023-06-07 NOTE — Progress Notes (Signed)
 Because this visit was a virtual/telehealth visit,  certain criteria was not obtained, such a blood pressure, CBG if applicable, and timed get up and go. Any medications not marked as "taking" were not mentioned during the medication reconciliation part of the visit. Any vitals not documented were not able to be obtained due to this being a telehealth visit or patient was unable to self-report a recent blood pressure reading due to a lack of equipment at home via telehealth. Vitals that have been documented are verbally provided by the patient.   Subjective:   Brandy Parker is a 73 y.o. who presents for a Medicare Wellness preventive visit.  Visit Complete: Virtual I connected with  Brandy Parker on 06/07/23 by a audio enabled telemedicine application and verified that I am speaking with the correct person using two identifiers.  Patient Location: Home  Provider Location: Home Office  I discussed the limitations of evaluation and management by telemedicine. The patient expressed understanding and agreed to proceed.  Vital Signs: Because this visit was a virtual/telehealth visit, some criteria may be missing or patient reported. Any vitals not documented were not able to be obtained and vitals that have been documented are patient reported.  VideoDeclined- This patient declined Librarian, academic. Therefore the visit was completed with audio only.  Persons Participating in Visit: Patient.  AWV Questionnaire: Yes: Patient Medicare AWV questionnaire was completed by the patient on 06/05/2023; I have confirmed that all information answered by patient is correct and no changes since this date.  Cardiac Risk Factors include: advanced age (>78men, >54 women);smoking/ tobacco exposure     Objective:    Today's Vitals   06/07/23 1318 06/07/23 1319  BP: 132/80   Weight: 89 lb (40.4 kg)   Height: 5\' 1"  (1.549 m)   PainSc:  5    Body mass index is 16.82 kg/m.      06/07/2023    1:18 PM 04/20/2023    6:27 PM 04/19/2023    5:15 PM 06/01/2022    1:32 PM 05/29/2021   11:21 AM 05/27/2020   11:20 AM 05/18/2019    8:50 AM  Advanced Directives  Does Patient Have a Medical Advance Directive? No  No No No No No  Would patient like information on creating a medical advance directive? No - Patient declined No - Patient declined  No - Patient declined No - Patient declined      Current Medications (verified) Outpatient Encounter Medications as of 06/07/2023  Medication Sig   ALPRAZolam (XANAX) 0.25 MG tablet Take 1 tablet (0.25 mg total) by mouth at bedtime as needed for anxiety.   cholecalciferol (VITAMIN D3) 25 MCG (1000 UNIT) tablet Take 1,000 Units by mouth daily.   escitalopram (LEXAPRO) 10 MG tablet Take 1 tablet (10 mg total) by mouth daily.   Multiple Vitamin (MULTIVITAMIN) tablet Take 1 tablet by mouth daily.   simvastatin (ZOCOR) 20 MG tablet Take 1 tablet (20 mg total) by mouth daily at 6 PM.   ibuprofen (ADVIL) 400 MG tablet Take 1 tablet (400 mg total) by mouth every 8 (eight) hours as needed for mild pain (pain score 1-3) or moderate pain (pain score 4-6).   No facility-administered encounter medications on file as of 06/07/2023.    Allergies (verified) Sulfa antibiotics   History: Past Medical History:  Diagnosis Date   Anxiety    Hyperlipemia    Past Surgical History:  Procedure Laterality Date   ORIF ANKLE FRACTURE Left 07/20/2016  Procedure: OPEN REDUCTION INTERNAL FIXATION (ORIF) ANKLE FRACTURE;  Surgeon: Lyndle Herrlich, MD;  Location: [facility] ORS;  Service: Orthopedics;  Laterality: Left;   XI ROBOTIC LAPAROSCOPIC ASSISTED APPENDECTOMY N/A 04/20/2023   Procedure: XI ROBOTIC LAPAROSCOPIC ASSISTED APPENDECTOMY, OPEN ILEOCECECTOMY;  Surgeon: Sung Amabile, DO;  Location: [facility] ORS;  Service: General;  Laterality: N/A;   Family History  Problem Relation Age of Onset   Hypertension Mother    Heart failure Mother    Hypertension Father     Hyperlipidemia Father    Cancer Father        bladder   Heart failure Father    Cancer Sister        bladder   Breast cancer Neg Hx    Social History   Socioeconomic History   Marital status: Single    Spouse name: Not on file   Number of children: Not on file   Years of education: Not on file   Highest education level: 12th grade  Occupational History   Occupation: fulltime     Employer: [employer]  Tobacco Use   Smoking status: Every Day    Current packs/day: 0.50    Average packs/day: 0.5 packs/day for 50.2 years (25.1 ttl pk-yrs)    Types: Cigarettes    Start date: 1975   Smokeless tobacco: Never  Vaping Use   Vaping status: Never Used  Substance and Sexual Activity   Alcohol use: No   Drug use: No   Sexual activity: Not Currently  Other Topics Concern   Not on file  Social History Narrative   Not on file   Social Drivers of Health   Financial Resource Strain: Low Risk  (06/05/2023)   Overall Financial Resource Strain (CARDIA)    Difficulty of Paying Living Expenses: Not hard at all  Food Insecurity: No Food Insecurity (06/07/2023)   Hunger Vital Sign    Worried About Running Out of Food in the Last Year: Never true    Ran Out of Food in the Last Year: Never true  Transportation Needs: No Transportation Needs (06/05/2023)   PRAPARE - Administrator, Civil Service (Medical): No    Lack of Transportation (Non-Medical): No  Physical Activity: Insufficiently Active (06/05/2023)   Exercise Vital Sign    Days of Exercise per Week: 3 days    Minutes of Exercise per Session: 10 min  Stress: No Stress Concern Present (06/05/2023)   [employer] of Occupational Health - Occupational Stress Questionnaire    Feeling of Stress : Not at all  Social Connections: Socially Isolated (06/05/2023)   Social Connection and Isolation Panel [NHANES]    Frequency of Communication with Friends and Family: Once a week    Frequency of Social Gatherings with Friends and  Family: More than three times a week    Attends Religious Services: Never    Database administrator or Organizations: No    Attends Engineer, structural: Never    Marital Status: Never married    Tobacco Counseling Ready to quit: Not Answered Counseling given: Not Answered    Clinical Intake:  Pre-visit preparation completed: Yes  Pain : 0-10 Pain Score: 5  Pain Type: Acute pain Pain Location:  (stomach) Pain Orientation: Mid Pain Descriptors / Indicators: Aching Pain Onset: Today Pain Frequency: Rarely     BMI - recorded: 16.82 Nutritional Status: BMI <19  Underweight Nutritional Risks: None Diabetes: No  No results found for: "HGBA1C"   How often do you  need to have someone help you when you read instructions, pamphlets, or other written materials from your doctor or pharmacy?: 1 - Never  Interpreter Needed?: No  Information entered by :: Jennika Ringgold whitfied,CMA   Activities of Daily Living     06/07/2023    1:23 PM 06/05/2023    2:01 PM  In your present state of health, do you have any difficulty performing the following activities:  Hearing? 0 0  Vision? 0 0  Difficulty concentrating or making decisions? 0 0  Walking or climbing stairs? 0 0  Dressing or bathing? 0 0  Doing errands, shopping? 0 0  Preparing Food and eating ? N N  Using the Toilet? N N  In the past six months, have you accidently leaked urine? N N  Do you have problems with loss of bowel control? N N  Managing your Medications? N N  Managing your Finances? N N  Housekeeping or managing your Housekeeping? N N    Patient Care Team: Marjie Skiff, NP as PCP - General (Nurse Practitioner)  Indicate any recent Medical Services you may have received from other than [facility] providers in the past year (date may be approximate).     Assessment:   This is a routine wellness examination for Brandy Parker.  Hearing/Vision screen Hearing Screening - Comments:: No difficult hearing  Vision  Screening - Comments:: Patient wears glasses    Goals Addressed               This Visit's Progress     Patient Stated (pt-stated)        To feel better        Depression Screen     05/28/2023    1:14 PM 02/12/2023    1:19 PM 11/13/2022    1:07 PM 06/01/2022    1:31 PM 05/15/2022    1:15 PM 02/13/2022    1:27 PM 11/07/2021    1:16 PM  PHQ 2/9 Scores  PHQ - 2 Score 0 0 0 0 0 0 0  PHQ- 9 Score 0 0 0 0 0 0 0    Fall Risk     06/07/2023    1:23 PM 06/05/2023    2:01 PM 05/28/2023    1:14 PM 11/13/2022    1:07 PM 06/01/2022    1:33 PM  Fall Risk   Falls in the past year? 0 0 0 0 0  Number falls in past yr: 0 0 0 0 0  Injury with Fall? 0 0 0 0 0  Risk for fall due to : No Fall Risks  No Fall Risks No Fall Risks No Fall Risks  Follow up Falls evaluation completed  Falls evaluation completed Falls evaluation completed Falls prevention discussed;Falls evaluation completed    MEDICARE RISK AT HOME:  Medicare Risk at Home Any stairs in or around the home?: (Patient-Rptd) No If so, are there any without handrails?: (Patient-Rptd) No Home free of loose throw rugs in walkways, pet beds, electrical cords, etc?: (Patient-Rptd) Yes Adequate lighting in your home to reduce risk of falls?: (Patient-Rptd) Yes Life alert?: (Patient-Rptd) No Use of a cane, walker or w/c?: (Patient-Rptd) No Grab bars in the bathroom?: (Patient-Rptd) No Shower chair or bench in shower?: (Patient-Rptd) No Elevated toilet seat or a handicapped toilet?: (Patient-Rptd) No  TIMED UP AND GO:  Was the test performed?  No  Cognitive Function: 6CIT completed        06/07/2023    1:21 PM 06/01/2022  1:39 PM 05/27/2020   11:23 AM  6CIT Screen  What Year? 0 points 0 points 0 points  What month? 0 points 0 points 0 points  What time? 0 points 0 points 0 points  Count back from 20 0 points 0 points 0 points  Months in reverse 4 points 0 points 0 points  Repeat phrase 4 points  0 points  Total Score 8 points  0  points    Immunizations Immunization History  Administered Date(s) Administered   Fluad Quad(high Dose 65+) 01/26/2020, 05/07/2021   Fluad Trivalent(High Dose 65+) 11/13/2022   Influenza,inj,quad, With Preservative 01/02/2019   PFIZER(Purple Top)SARS-COV-2 Vaccination 07/06/2019, 08/01/2019, 02/14/2020, 11/05/2020   Pneumococcal Conjugate-13 03/31/2019   Pneumococcal Polysaccharide-23 05/03/2020   Tdap 12/15/2019    Screening Tests Health Maintenance  Topic Date Due   COVID-19 Vaccine (5 - 2024-25 season) 06/08/2023 (Originally 11/08/2022)   Zoster Vaccines- Shingrix (1 of 2) 08/28/2023 (Originally 02/05/1970)   Lung Cancer Screening  05/22/2024 (Originally 02/05/1999)   Colonoscopy  05/27/2024 (Originally 02/04/1994)   Medicare Annual Wellness (AWV)  06/06/2024   MAMMOGRAM  02/15/2025   DEXA SCAN  02/12/2027   DTaP/Tdap/Td (2 - Td or Tdap) 12/14/2029   Pneumonia Vaccine 6+ Years old  Completed   INFLUENZA VACCINE  Completed   Hepatitis C Screening  Completed   HPV VACCINES  Aged Out    Health Maintenance  There are no preventive care reminders to display for this patient. Health Maintenance Items Addressed:  Additional Screening:  Vision Screening: Recommended annual ophthalmology exams for early detection of glaucoma and other disorders of the eye.  Dental Screening: Recommended annual dental exams for proper oral hygiene  Community Resource Referral / Chronic Care Management: CRR required this visit?  No   CCM required this visit?  No     Plan:     I have personally reviewed and noted the following in the patient's chart:   Medical and social history Use of alcohol, tobacco or illicit drugs  Current medications and supplements including opioid prescriptions. Patient is not currently taking opioid prescriptions. Functional ability and status Nutritional status Physical activity Advanced directives List of other physicians Hospitalizations, surgeries,  and ER visits in previous 12 months Vitals Screenings to include cognitive, depression, and falls Referrals and appointments  In addition, I have reviewed and discussed with patient certain preventive protocols, quality metrics, and best practice recommendations. A written personalized care plan for preventive services as well as general preventive health recommendations were provided to patient.     Rudi Heap, [place]   06/07/2023   After Visit Summary: (MyChart) Due to this being a telephonic visit, the after visit summary with patients personalized plan was offered to patient via MyChart   Notes: Nothing significant to report at this time.

## 2023-06-07 NOTE — Discharge Instructions (Signed)
 You are found to have diverticulitis on CT imaging is a likely source of your symptoms today.  I have sent antibiotics to your pharmacy which should help clear this up.  You will also follow-up with general surgery.  Please return to the emergency department if you have any significant worsening of symptoms.  I have also sent nausea medication to help with that.  You can take over-the-counter medications like ibuprofen and Tylenol as needed for pain.

## 2023-06-07 NOTE — ED Provider Notes (Signed)
 [facility] Provider Note    Event Date/Time   First MD Initiated Contact with Patient 06/07/23 1501     (approximate)   History   Abdominal Pain   HPI Brandy Parker is a 73 y.o. female presenting today for abdominal pain.  Patient states having appendectomy approximately 2 months ago.  Recovered from the surgery well.  This morning she woke up with lower abdominal pain radiating into her epigastric region.  Associated with nausea and vomiting.  Also had a couple episodes of diarrhea this morning.  Otherwise denies fever, shortness of breath, chest pain.  Mild congestion as well.  Otherwise denies dysuria, hematuria.  No other abdominal surgeries.     Physical Exam   Triage Vital Signs: ED Triage Vitals  Encounter Vitals Group     BP 06/07/23 1439 (!) 204/140     Systolic BP Percentile --      Diastolic BP Percentile --      Pulse Rate 06/07/23 1439 (!) 114     Resp 06/07/23 1439 20     Temp 06/07/23 1439 99 F (37.2 C)     Temp Source 06/07/23 1439 Oral     SpO2 06/07/23 1439 98 %     Weight --      Height --      Head Circumference --      Peak Flow --      Pain Score 06/07/23 1440 9     Pain Loc --      Pain Education --      Exclude from Growth Chart --     Most recent vital signs: Vitals:   06/07/23 1730 06/07/23 1825  BP:  (!) 184/74  Pulse: 84 84  Resp: 13 18  Temp:  98.1 F (36.7 C)  SpO2: 100% 100%   Physical Exam: I have reviewed the vital signs and nursing notes. General: Awake, alert, no acute distress.  Nontoxic appearing. Head:  Atraumatic, normocephalic.   ENT:  EOM intact, PERRL. Oral mucosa is pink and moist with no lesions. Neck: Neck is supple with full range of motion, No meningeal signs. Cardiovascular:  RRR, No murmurs. Peripheral pulses palpable and equal bilaterally. Respiratory:  Symmetrical chest wall expansion.  No rhonchi, rales, or wheezes.  Good air movement throughout.  No use of accessory muscles.    Musculoskeletal:  No cyanosis or edema. Moving extremities with full ROM Abdomen:  Soft, mild tenderness palpation periumbilically radiating into the epigastric region, well-healing surgical scar, nondistended. Neuro:  GCS 15, moving all four extremities, interacting appropriately. Speech clear. Psych:  Calm, appropriate.   Skin:  Warm, dry, no rash.    ED Results / Procedures / Treatments   Labs (all labs ordered are listed, but only abnormal results are displayed) Labs Reviewed  CBC - Abnormal; Notable for the following components:      Result Value   WBC 11.0 (*)    All other components within normal limits  URINALYSIS, ROUTINE W REFLEX MICROSCOPIC - Abnormal; Notable for the following components:   Color, Urine YELLOW (*)    APPearance CLOUDY (*)    Ketones, ur 20 (*)    All other components within normal limits  COMPREHENSIVE METABOLIC PANEL WITH GFR - Abnormal; Notable for the following components:   Glucose, Bld 131 (*)    All other components within normal limits  RESP PANEL BY RT-PCR (RSV, FLU A&B, COVID)  RVPGX2  LIPASE, BLOOD     EKG  RADIOLOGY Independently interpreted CT imaging with evidence of diverticulitis   PROCEDURES:  Critical Care performed: No  Procedures   MEDICATIONS ORDERED IN ED: Medications  ondansetron (ZOFRAN) injection 4 mg (4 mg Intravenous Given 06/07/23 1452)  sodium chloride 0.9 % bolus 1,000 mL (0 mLs Intravenous Stopped 06/07/23 1901)  morphine (PF) 4 MG/ML injection 4 mg (4 mg Intravenous Given 06/07/23 1529)  iohexol (OMNIPAQUE) 300 MG/ML solution 60 mL (60 mLs Intravenous Contrast Given 06/07/23 1807)  amoxicillin-clavulanate (AUGMENTIN) 875-125 MG per tablet 1 tablet (1 tablet Oral Given 06/07/23 1907)     IMPRESSION / MDM / ASSESSMENT AND PLAN / ED COURSE  I reviewed the triage vital signs and the nursing notes.                              Differential diagnosis includes, but is not limited to, viral gastroenteritis,  colitis, enteritis, pancreatitis, cholecystitis, acute cystitis  Patient's presentation is most consistent with acute complicated illness / injury requiring diagnostic workup.  Patient is a 73 year old female presenting today for periumbilical and epigastric abdominal pain associate with nausea, vomiting, diarrhea.  No significant tenderness palpation the abdomen but mostly present along the periumbilical and epigastric regions.  Given Zofran, morphine, and fluids for symptomatic management.  CT imaging showed evidence of diverticulitis with questionable microperforation but no abscess.  No significant leukocytosis.  CMP and lipase otherwise normal.  UA negative and negative for COVID, flu, and RSV.  Patient was reassessed and had no ongoing abdominal pain symptoms.  She was able to tolerate p.o.  Given Augmentin here.  Given that the microperforation is only listed as questionable and she is now completely asymptomatic and no signs of sepsis, I do think she is reasonable for outpatient management with oral antibiotics.  Considered admission but patient comfortable with going home and I agree with this.  Will give her general surgery follow-up and strict return precautions for any acute worsening symptoms.  The patient is on the cardiac monitor to evaluate for evidence of arrhythmia and/or significant heart rate changes.     FINAL CLINICAL IMPRESSION(S) / ED DIAGNOSES   Final diagnoses:  Diverticulitis     Rx / DC Orders   ED Discharge Orders          Ordered    amoxicillin-clavulanate (AUGMENTIN) 875-125 MG tablet  2 times daily        06/07/23 1904    Ambulatory referral to General Surgery       Comments: Diverticulitis follow-up   06/07/23 1904    ondansetron (ZOFRAN) 4 MG tablet  Every 8 hours PRN        06/07/23 1906             Note:  This document was prepared using Dragon voice recognition software and may include unintentional dictation errors.   Janith Lima,  MD 06/07/23 Brandy Parker

## 2023-06-07 NOTE — ED Triage Notes (Addendum)
 Pt to ED via [facility] from home. Pt reports umbilical/epigastric abd pain that started this am. Pt also reports N/V/D. Pt has appendectomy a month ago. Pt hunched over in triage reporting pain is better with position.   CBG 112. Pt hypertensive with EMS 210/103. Pt reports borderline HTN but not dx or on meds.

## 2023-06-07 NOTE — Telephone Encounter (Signed)
 Copied from CRM [id number]. Topic: Clinical - Red Word Triage >> Jun 07, 2023  1:44 PM Pierre Bali B wrote: [facility] that prompted transfer to Nurse Triage: bad stomach pain  Chief Complaint: severe 10/10 abd pain with nausea, vomiting and diarrhea; left work early Symptoms: see above Frequency: constant Pertinent Negatives: Patient denies cp, sob Disposition: [x] ED /[] Urgent Care (no appt availability in office) / [] Appointment(In office/virtual)/ []  [facility]/ [] Home Care/ [] Refused Recommended Disposition /[] [facility]/ []  Follow-up with PCP Additional Notes: instructed to go to the ER.  Pcp office updated.   Reason for Disposition  [1] SEVERE pain AND [2] age > 60 years  Answer Assessment - Initial Assessment Questions 1. LOCATION: "Where does it hurt?"      Lower abd pain; nausea 2. RADIATION: "Does the pain shoot anywhere else?" (e.g., chest, back)     denies 3. ONSET: "When did the pain begin?" (e.g., minutes, hours or days ago)      today 4. SUDDEN: "Gradual or sudden onset?"     suddenly 5. PATTERN "Does the pain come and go, or is it constant?"    - If it comes and goes: "How long does it last?" "Do you have pain now?"     (Note: Comes and goes means the pain is intermittent. It goes away completely between bouts.)    - If constant: "Is it getting better, staying the same, or getting worse?"      (Note: Constant means the pain never goes away completely; most serious pain is constant and gets worse.)      Vomited 3 times; pain is constant 6. SEVERITY: "How bad is the pain?"  (e.g., Scale 1-10; mild, moderate, or severe)    - MILD (1-3): Doesn't interfere with normal activities, abdomen soft and not tender to touch.     - MODERATE (4-7): Interferes with normal activities or awakens from sleep, abdomen tender to touch.     - SEVERE (8-10): Excruciating pain, doubled over, unable to do any normal activities.       9-10/10 7. RECURRENT SYMPTOM: "Have you  ever had this type of stomach pain before?" If Yes, ask: "When was the last time?" and "What happened that time?"      yes 8. CAUSE: "What do you think is causing the stomach pain?"     "I don't know" 9. RELIEVING/AGGRAVATING FACTORS: "What makes it better or worse?" (e.g., antacids, bending or twisting motion, bowel movement)     no 10. OTHER SYMPTOMS: "Do you have any other symptoms?" (e.g., back pain, diarrhea, fever, urination pain, vomiting)       Vomiting, diarrhea 11. PREGNANCY: "Is there any chance you are pregnant?" "When was your last menstrual period?"       na  Protocols used: Abdominal Pain - [facility]

## 2023-06-17 ENCOUNTER — Ambulatory Visit (INDEPENDENT_AMBULATORY_CARE_PROVIDER_SITE_OTHER): Payer: Self-pay | Admitting: General Surgery

## 2023-06-17 ENCOUNTER — Encounter: Payer: Self-pay | Admitting: General Surgery

## 2023-06-17 VITALS — BP 170/92 | HR 72 | Temp 98.1°F | Ht 61.0 in | Wt 86.6 lb

## 2023-06-17 DIAGNOSIS — K5732 Diverticulitis of large intestine without perforation or abscess without bleeding: Secondary | ICD-10-CM | POA: Diagnosis not present

## 2023-06-17 NOTE — Progress Notes (Signed)
 Patient ID: Brandy Parker, female   DOB: Apr 20, 1950, 73 y.o.   MRN: [id number] CC: Diverticulitis History of Present Illness Brandy Parker is a 73 y.o. female with past medical history as below who presents in consultation for diverticulitis.  The patient underwent a robotic laparoscopic converted to open appendectomy with ileocecectomy in February.  She reports that she healed well from this however approximately a week and a half ago she started to have umbilical and nominal pain.  She came to the emergency department and had a CT that was concerning for diverticulitis with possible microperforation.  Her pain improved with antibiotics in the ED and she was discharged on a course of antibiotics.  She reports that since then she has had no abdominal pain.  She says that she is having stools consistently.  She does report that there has been a change in the color and looks some lighter in character.  She also reports that she has continued to lose weight since her surgery although she has a good appetite.  She has had a colonoscopy before but reports that it has been a very long time since this.  She denies any blood in her stool.  Past Medical History Past Medical History:  Diagnosis Date   Anxiety    Hyperlipemia        Past Surgical History:  Procedure Laterality Date   ORIF ANKLE FRACTURE Left 07/20/2016   Procedure: OPEN REDUCTION INTERNAL FIXATION (ORIF) ANKLE FRACTURE;  Surgeon: Lyndle Herrlich, MD;  Location: [facility] ORS;  Service: Orthopedics;  Laterality: Left;   XI ROBOTIC LAPAROSCOPIC ASSISTED APPENDECTOMY N/A 04/20/2023   Procedure: XI ROBOTIC LAPAROSCOPIC ASSISTED APPENDECTOMY, OPEN ILEOCECECTOMY;  Surgeon: Sung Amabile, DO;  Location: [facility] ORS;  Service: General;  Laterality: N/A;    Allergies  Allergen Reactions   Sulfa Antibiotics Other (See Comments)    Per patient, unknown reaction    Current Outpatient Medications  Medication Sig Dispense Refill   ALPRAZolam (XANAX) 0.25 MG  tablet Take 1 tablet (0.25 mg total) by mouth at bedtime as needed for anxiety. 90 tablet 0   cholecalciferol (VITAMIN D3) 25 MCG (1000 UNIT) tablet Take 1,000 Units by mouth daily.     escitalopram (LEXAPRO) 10 MG tablet Take 1 tablet (10 mg total) by mouth daily. 90 tablet 4   Multiple Vitamin (MULTIVITAMIN) tablet Take 1 tablet by mouth daily.     ondansetron (ZOFRAN) 4 MG tablet Take 1 tablet (4 mg total) by mouth every 8 (eight) hours as needed. 15 tablet 0   simvastatin (ZOCOR) 20 MG tablet Take 1 tablet (20 mg total) by mouth daily at 6 PM. 90 tablet 4   No current facility-administered medications for this visit.    Family History Family History  Problem Relation Age of Onset   Hypertension Mother    Heart failure Mother    Hypertension Father    Hyperlipidemia Father    Cancer Father        bladder   Heart failure Father    Cancer Sister        bladder   Breast cancer Neg Hx        Social History Social History   Tobacco Use   Smoking status: Every Day    Current packs/day: 0.50    Average packs/day: 0.5 packs/day for 50.3 years (25.1 ttl pk-yrs)    Types: Cigarettes    Start date: 27   Smokeless tobacco: Never  Vaping Use   Vaping status:  Never Used  Substance Use Topics   Alcohol use: No   Drug use: No        ROS Full ROS of systems performed and is otherwise negative there than what is stated in the HPI  Physical Exam Blood pressure (!) 170/92, pulse 72, temperature 98.1 F (36.7 C), temperature source Oral, height 5\' 1"  (1.549 m), weight 86 lb 9.6 oz (39.3 kg), SpO2 100%.  Thin appearing woman in no acute distress, alert and oriented x 3, clear to auscultation bilaterally, regular rate and rhythm, abdomen is soft, nontender and nondistended.  She has well-healed surgical scars both open and robotic scars that are healing well.,  Moving all extremity spontaneously.  Data Reviewed I reviewed her CT scan.  The CT scan was significant for inflammation  around her sigmoid colon.  There is certainly diverticular disease.  I do not see an obvious perforation or any abscess formation.  I have personally reviewed the patient's imaging and medical records.    Assessment    Patient is a 73 year old with 1 bout of diverticulitis that did not require any hospitalization and was successfully treated with antibiotics on an outpatient basis.  She has had 1 colonoscopy but has been a long time since this.  She is also losing weight over the last several weeks.  Plan    I discussed the CT scan with the patient that it did show an episode of diverticulitis but this was likely uncomplicated given the fact that she did not require any hospitalization.  I discussed with her that we would not entertain surgery with just 1 bout of uncomplicated diverticulitis.  I also recommended that she undergo a colonoscopy to ensure that there is no malignant disease that caused this episode of pain.  We will plan to complete her colonoscopy by me.  I would like to wait another 6 to 8 weeks to let the inflammation improved before we proceed with colonoscopy.  Of note the patient does have a relationship with Dr. Tonna Boehringer but was told by the ED to follow-up with me.  I discussed that she could be referred back to Dr. Tonna Boehringer since she is his patient.  However she has elected to continue seeing me.  A total of 45 minutes was spent reviewing the patient's hospital chart and imaging, performing a history and physical and discussing treatment options with the patient.    Kandis Cocking 06/17/2023, 11:36 AM

## 2023-06-17 NOTE — Patient Instructions (Addendum)
 You have requested to have a Colonoscopy. This will be done at [facility] with Dr. Maurine Minister.  If you are on any injectable weight loss medication, you will need to stop taking your GLP-1 injectable (weight loss) medications 8 days before your surgery to avoid any complications with anesthesia.   Please follow the provided Colonoscopy prep and diet guidelines. We have sent in a prescription for your prep into your pharmacy provided.  Please see the (blue)pre-care form that you have been given today. Our surgery scheduler will call you to verify Colonoscopy date and to go over information.   You will receive a call from Endoscopy at [facility] to review information and any medication adjustments about 5-7 days before your scheduled Colonoscopy.   If you have any questions, please call our office.   Colonoscopy, Adult A colonoscopy is an exam to look at the large intestine. It is done using a long, thin, flexible tube that has a camera on the end. This exam is done to check for problems, such as: An abnormal growth of cells or tissue (tumor). Abnormal growths within the lining of your intestine (polyps). Irritation and swelling (inflammation). Bleeding. Tell your doctor about: Any allergies you have. All medicines you are taking. Tell him or her about vitamins, herbs, eye drops, creams, and over-the-counter medicines. Any problems you or family members have had with anesthetic medicines. Any bleeding problems you have. Any surgeries you have had. Any medical conditions you have. Any problems you have had with pooping (having bowel movements). Whether you are pregnant or may be pregnant. What are the risks? Generally, this is a safe procedure. However, problems may occur, such as: Bleeding. Damage to your intestine. Allergic reactions to medicines given during the procedure. Infection. This is rare. Bowel prep If you were prescribed a bowel prep to take by mouth (orally) to clean out  your colon: Take it as told by your doctor. Starting the day before your procedure, you will need to drink a lot of liquid medicine. The liquid will cause you to poop until your poop is almost clear or light green. If your skin or butt gets irritated from diarrhea, you may: Wipe the area with wipes that have medicine in them, such as adult wet wipes with aloe and vitamin E. Put something on your skin that soothes the area, such as petroleum jelly. If you vomit while drinking the bowel prep: Take a break for up to 60 minutes. Begin the bowel prep again. Call your doctor if you keep vomiting and you cannot take the bowel prep without vomiting. To clean out your colon, you may also be given: Laxative medicines. These help you poop. Instructions for using a liquid medicine (enema) injected into your butt. Medicines Ask your doctor about changing or stopping: Your normal medicines. Vitamins, herbs, and supplements. Over-the-counter medicines. Do not take aspirin or ibuprofen unless you are told to. General instructions Ask your doctor what steps will be taken to prevent the spread of germs. These may include washing skin with a soap that kills germs. If you will be going home right after the procedure, plan to have a responsible adult: Take you home from the hospital or clinic. You will not be allowed to drive. Care for you for the time you are told. What happens during the procedure?  An IV tube will be put into one of your veins. You will be given a medicine to make you fall asleep (general anesthetic). You will lie on your  side with your knees bent. Oil or gel will be put on the tube. Then the tube will be: Put into the opening of the butt (anus). Gently put into your large intestine. Air will be sent into your colon to keep it open. You may feel some pressure or cramping. The camera will be used to take photos that will appear on a screen. A small tissue sample may be removed for  testing (biopsy). If small growths are found, your doctor may remove them and have them checked for cancer. The tube will be slowly removed. The procedure may vary among doctors and hospitals. What happens after the procedure? You will be monitored until you leave the hospital or clinic. This includes checking your blood pressure, heart rate, breathing rate, and blood oxygen level. You may have a small amount of blood in your poop. You may pass gas. You may have mild cramps or bloating in your belly (abdomen). If you were given a sedative during your procedure, do not drive or use machines until your doctor says that it is safe. It is up to you to get the results of your procedure. Ask how to get your results when they are ready. Summary A colonoscopy is an exam to look at the large intestine. Follow instructions from your doctor about eating and drinking before the procedure. You may be prescribed an oral bowel prep to clean out your colon. Take it as told by your doctor. A flexible tube with a camera on its end will be put into the opening of the butt. It will be passed into the large intestine. This information is not intended to replace advice given to you by your health care provider. Make sure you discuss any questions you have with your health care provider.       Diverticulitis  Diverticulitis is when small pouches in your colon get infected or swollen. This causes pain in your belly (abdomen) and watery poop (diarrhea). The small pouches are called diverticula. They may form if you have a condition called diverticulosis. What are the causes? You may get this condition if poop (stool) gets trapped in the pouches in your colon. The poop lets germs (bacteria) grow. This causes an infection. What increases the risk? You are more likely to get this condition if you have small pouches in your colon. You are also more likely to get it if: You are overweight or very overweight  (obese). You do not exercise enough. You drink alcohol. You smoke. You eat a lot of red meat, like beef, pork, or lamb. You do not eat enough fiber. You are older than 75 years of age. What are the signs or symptoms? Pain in your belly. Pain is often on the left side, but it may be felt in other spots too. Fever and chills. Feeling like you may vomit. Vomiting. Having cramps. Feeling full. Changes in how often you poop. Blood in your poop. How is this treated? Most cases are treated at home. You may be told to: Take over-the-counter pain medicines. Only eat and drink clear liquids. Take antibiotics. Rest. Very bad cases may need to be treated at a hospital. Treatment may include: Not eating or drinking. Taking pain medicines. Getting antibiotics through an IV tube. Getting fluid and food through an IV tube. Having surgery. When you are feeling better, you may need to have a test to look at your colon (colonoscopy). Follow these instructions at home: Medicines Take over-the-counter and prescription medicines only  as told by your doctor. These include: Fiber pills. Probiotics. Medicines to make your poop soft (stool softeners). If you were prescribed antibiotics, take them as told by your doctor. Do not stop taking them even if you start to feel better. Ask your doctor if you should avoid driving or using machines while you are taking your medicine. Eating and drinking  Follow the diet told by your doctor. You may need to only eat and drink liquids. When you feel better, you may be able to eat more foods. You may also be told to eat a lot of fiber. Fiber helps you poop. Foods with fiber include berries, beans, lentils, and green vegetables. Try not to eat red meat. General instructions Do not smoke or use any products that contain nicotine or tobacco. If you need help quitting, ask your doctor. Exercise 3 or more times a week. Try to go for 30 minutes each time. Exercise  enough to sweat and make your heart beat faster. Contact a doctor if: Your pain gets worse. You are not pooping like normal. Your symptoms do not get better. Your symptoms get worse very fast. You have a fever. You vomit more than one time. You have poop that is: Bloody. Black. Tarry. This information is not intended to replace advice given to you by your health care provider. Make sure you discuss any questions you have with your health care provider. Document Revised: 11/20/2021 Document Reviewed: 11/20/2021 Elsevier Patient Education  2024 [employer].

## 2023-08-28 NOTE — Patient Instructions (Signed)
 Be Involved in Caring For Your Health:  Taking Medications When medications are taken as directed, they can greatly improve your health. But if they are not taken as prescribed, they may not work. In some cases, not taking them correctly can be harmful. To help ensure your treatment remains effective and safe, understand your medications and how to take them. Bring your medications to each visit for review by your provider.  Your lab results, notes, and after visit summary will be available on My Chart. We strongly encourage you to use this feature. If lab results are abnormal the clinic will contact you with the appropriate steps. If the clinic does not contact you assume the results are satisfactory. You can always view your results on My Chart. If you have questions regarding your health or results, please contact the clinic during office hours. You can also ask questions on My Chart.  We at [facility] are grateful that you chose [place] to provide your care. We strive to provide evidence-based and compassionate care and are always looking for feedback. If you get a survey from the clinic please complete this so we can hear your opinions.  Managing Anxiety, Adult After being diagnosed with anxiety, you may be relieved to know why you have felt or behaved a certain way. You may also feel overwhelmed about the treatment ahead and what it will mean for your life. With care and support, you can manage your anxiety. How to manage lifestyle changes Understanding the difference between stress and anxiety Although stress can play a role in anxiety, it is not the same as anxiety. Stress is your body's reaction to life changes and events, both good and bad. Stress is often caused by something external, such as a deadline, test, or competition. It normally goes away after the event has ended and will last just a few hours. But, stress can be ongoing and can lead to more than just stress. Anxiety is  caused by something internal, such as imagining a terrible outcome or worrying that something will go wrong that will greatly upset you. Anxiety often does not go away even after the event is over, and it can become a long-term (chronic) worry. Lowering stress and anxiety Talk with your health care provider or a counselor to learn more about lowering anxiety and stress. They may suggest tension-reduction techniques, such as: Music. Spend time creating or listening to music that you enjoy and that inspires you. Mindfulness-based meditation. Practice being aware of your normal breaths while not trying to control your breathing. It can be done while sitting or walking. Centering prayer. Focus on a word, phrase, or sacred image that means something to you and brings you peace. Deep breathing. Expand your stomach and inhale slowly through your nose. Hold your breath for 3-5 seconds. Then breathe out slowly, letting your stomach muscles relax. Self-talk. Learn to notice and spot thought patterns that lead to anxiety reactions. Change those patterns to thoughts that feel peaceful. Muscle relaxation. Take time to tense muscles and then relax them. Choose a tension-reduction technique that fits your lifestyle and personality. These techniques take time and practice. Set aside 5-15 minutes a day to do them. Specialized therapists can offer counseling and training in these techniques. The training to help with anxiety may be covered by some insurance plans. Other things you can do to manage stress and anxiety include: Keeping a stress diary. This can help you learn what triggers your reaction and then learn ways  to manage your response. Thinking about how you react to certain situations. You may not be able to control everything, but you can control your response. Making time for activities that help you relax and not feeling guilty about spending your time in this way. Doing visual imagery. This involves  imagining or creating mental pictures to help you relax. Practicing yoga. Through yoga poses, you can lower tension and relax.  Medicines Medicines for anxiety include: Antidepressant medicines. These are usually prescribed for long-term daily control. Anti-anxiety medicines. These may be added in severe cases, especially when panic attacks occur. When used together, medicines, psychotherapy, and tension-reduction techniques may be the most effective treatment. Relationships Relationships can play a big part in helping you recover. Spend more time connecting with trusted friends and family members. Think about going to couples counseling if you have a partner, taking family education classes, or going to family therapy. Therapy can help you and others better understand your anxiety. How to recognize changes in your anxiety Everyone responds differently to treatment for anxiety. Recovery from anxiety happens when symptoms lessen and stop interfering with your daily life at home or work. This may mean that you will start to: Have better concentration and focus. Worry will interfere less in your daily thinking. Sleep better. Be less irritable. Have more energy. Have improved memory. Try to recognize when your condition is getting worse. Contact your provider if your symptoms interfere with home or work and you feel like your condition is not improving. Follow these instructions at home: Activity Exercise. Adults should: Exercise for at least 150 minutes each week. The exercise should increase your heart rate and make you sweat (moderate-intensity exercise). Do strengthening exercises at least twice a week. Get the right amount and quality of sleep. Most adults need 7-9 hours of sleep each night. Lifestyle  Eat a healthy diet that includes plenty of vegetables, fruits, whole grains, low-fat dairy products, and lean protein. Do not eat a lot of foods that are high in fats, added sugars, or salt  (sodium). Make choices that simplify your life. Do not use any products that contain nicotine or tobacco. These products include cigarettes, chewing tobacco, and vaping devices, such as e-cigarettes. If you need help quitting, ask your provider. Avoid caffeine, alcohol, and certain over-the-counter cold medicines. These may make you feel worse. Ask your pharmacist which medicines to avoid. General instructions Take over-the-counter and prescription medicines only as told by your provider. Keep all follow-up visits. This is to make sure you are managing your anxiety well or if you need more support. Where to find support You can get help and support from: Self-help groups. Online and [employer]. A trusted spiritual leader. Couples counseling. Family education classes. Family therapy. Where to find more information You may find that joining a support group helps you deal with your anxiety. The following sources can help you find counselors or support groups near you: Mental Health [place]: mentalhealthamerica.net Anxiety and [employer] Association of [place] (ADAA): adaa.org The [employer] Mental Illness (NAMI): nami.org Contact a health care provider if: You have a hard time staying focused or finishing tasks. You spend many hours a day feeling worried about everyday life. You are very tired because you cannot stop worrying. You start to have headaches or often feel tense. You have chronic nausea or diarrhea. Get help right away if: Your heart feels like it is racing. You have shortness of breath. You have thoughts of hurting yourself or others. Get help  right away if you feel like you may hurt yourself or others, or have thoughts about taking your own life. Go to your nearest emergency room or: Call 911. Call the National Suicide Prevention Lifeline at 765-482-1593 or 988. This is open 24 hours a day. Text the Crisis Text Line at 504 124 9896. This information is not  intended to replace advice given to you by your health care provider. Make sure you discuss any questions you have with your health care provider. Document Revised: 12/02/2021 Document Reviewed: 06/16/2020 Elsevier Patient Education  2024 [employer].

## 2023-09-03 ENCOUNTER — Ambulatory Visit (INDEPENDENT_AMBULATORY_CARE_PROVIDER_SITE_OTHER): Admitting: Nurse Practitioner

## 2023-09-03 ENCOUNTER — Encounter: Payer: Self-pay | Admitting: Nurse Practitioner

## 2023-09-03 VITALS — BP 142/80 | HR 63 | Temp 98.4°F | Resp 15 | Ht 60.98 in | Wt 90.4 lb

## 2023-09-03 DIAGNOSIS — Z79899 Other long term (current) drug therapy: Secondary | ICD-10-CM | POA: Diagnosis not present

## 2023-09-03 DIAGNOSIS — E782 Mixed hyperlipidemia: Secondary | ICD-10-CM

## 2023-09-03 DIAGNOSIS — I10 Essential (primary) hypertension: Secondary | ICD-10-CM | POA: Diagnosis not present

## 2023-09-03 DIAGNOSIS — F411 Generalized anxiety disorder: Secondary | ICD-10-CM

## 2023-09-03 MED ORDER — ALPRAZOLAM 0.25 MG PO TABS: 0.2500 mg | ORAL_TABLET | Freq: Every evening | ORAL | 0 refills | Status: DC | PRN

## 2023-09-03 MED ORDER — OLMESARTAN MEDOXOMIL 5 MG PO TABS: 10.0000 mg | ORAL_TABLET | Freq: Every day | ORAL | 2 refills | Status: DC

## 2023-09-03 NOTE — Assessment & Plan Note (Signed)
 BP remains elevated on recheck and has trended up since her gall bladder surgery.  Discussed with patient at length.  Will start Olmesartan 10 MG daily, since she is a smoker ARB preferred over ACE.  Educated her on medication and side effects to alert provider of.  Recommend she monitor BP at least a few mornings a week at home and document.  DASH diet at home.  Continue current medication regimen and adjust as needed.  Labs today: obtain BMP next visit.

## 2023-09-03 NOTE — Assessment & Plan Note (Signed)
 Chronic, stable.  Only taking Xanax 0.25 MG at night 5 days a week.  Have sent in refill 0.25 MG QHS #90 tablets with 0 refills recently.  Had discussion about BEERS criteria and risks with these.   Will continue to monitor, she is up to date on contract and UDS due 05/27/24 next.  Continue Lexapro.  She agrees to every 3 month visits, will return in 3 months.

## 2023-09-03 NOTE — Progress Notes (Signed)
 .cfpb   BP (!) 142/80 (BP Location: Left Arm, Patient Position: Sitting, Cuff Size: Normal)   Pulse 63   Temp 98.4 F (36.9 C) (Oral)   Resp 15   Ht 5' 0.98 (1.549 m)   Wt 90 lb 6.4 oz (41 kg)   SpO2 98%   BMI 17.09 kg/m    Subjective:    Patient ID: Brandy Parker, female    DOB: [birth year], 73 y.o.   MRN: [id number]  HPI: Brandy Parker is a 73 y.o. female  Chief Complaint  Patient presents with   Anxiety    About the same. Says medication is mostly to help her sleep at night and it does the trick.    Hyperlipidemia   Blood pressure elevavted    Says its been up since her appendix was removed.    HYPERTENSION / HYPERLIPIDEMIA No current BP medications. Continues Simvastatin .  Continues to smoke about 5-6 cigarettes daily. Every morning since having gall bladder removed her legs get weak in morning, but once has BM this improves. Duration of hyperlipidemia: chronic Cholesterol medication side effects: no Cholesterol supplements: none Medication compliance: good compliance Aspirin: no Recent stressors: no Recurrent headaches: no Visual changes: no Palpitations: no Dyspnea: no Chest pain: no Lower extremity edema: no Dizzy/lightheaded: no   ANXIETY/STRESS Continues Xanax  0.25 MG PRN (5 nights a week) and Lexapro  10 MG daily.   HISTORY: Has been on Xanax  for a long while per her report, since 2008  Pt is aware of risks of benzo medication use to include increased sedation, respiratory suppression, falls, dependence and cardiovascular events. Pt would like to continue treatment as benefit determined to outweigh risk.  On PDMP review her last fill Xanax  was 05/28/23 for 90 day supply. Previously got 90 day supply from past provider.  Continues to smoke cigarettes. Duration:stable Anxious mood: no  Excessive worrying: no Irritability: no  Sweating: no Nausea: no Palpitations:no Hyperventilation: no Panic attacks: no Agoraphobia: no  Obscessions/compulsions:  no Depressed mood: no    09/03/2023    1:10 PM 05/28/2023    1:14 PM 02/12/2023    1:19 PM 11/13/2022    1:07 PM 06/01/2022    1:31 PM  Depression screen PHQ 2/9  Decreased Interest  0 0 0 0  Down, Depressed, Hopeless 0 0 0 0 0  PHQ - 2 Score 0 0 0 0 0  Altered sleeping 0 0 0 0 0  Tired, decreased energy 0 0 0 0 0  Change in appetite 0 0 0 0 0  Feeling bad or failure about yourself  0 0 0 0 0  Trouble concentrating 0 0 0 0 0  Moving slowly or fidgety/restless 0 0 0 0 0  Suicidal thoughts 0 0 0 0 0  PHQ-9 Score 0 0 0 0 0  Difficult doing work/chores Not difficult at all Not difficult at all  Not difficult at all Not difficult at all      09/03/2023    1:11 PM 02/12/2023    1:19 PM 11/13/2022    1:08 PM 05/15/2022    1:15 PM  GAD 7 : Generalized Anxiety Score  Nervous, Anxious, on Edge 0 0 0 0  Control/stop worrying 0 0 0 0  Worry too much - different things 0 0 0 0  Trouble relaxing 0 0 0 0  Restless 0 0 0 0  Easily annoyed or irritable 0 0 0 0  Afraid - awful might happen 0 0 0 0  Total GAD 7 Score 0 0 0 0  Anxiety Difficulty Not difficult at all  Not difficult at all Not difficult at all   Relevant past medical, surgical, family and social history reviewed and updated as indicated. Interim medical history since our last visit reviewed. Allergies and medications reviewed and updated.  Review of Systems  Constitutional:  Negative for activity change, appetite change, diaphoresis, fatigue and fever.  HENT:  Negative for ear discharge and ear pain.   Respiratory:  Negative for cough, chest tightness and shortness of breath.   Cardiovascular:  Negative for chest pain, palpitations and leg swelling.  Gastrointestinal: Negative.   Neurological: Negative.   Psychiatric/Behavioral:  Negative for decreased concentration, self-injury, sleep disturbance and suicidal ideas. The patient is not nervous/anxious.     Per HPI unless specifically indicated above     Objective:    BP (!)  142/80 (BP Location: Left Arm, Patient Position: Sitting, Cuff Size: Normal)   Pulse 63   Temp 98.4 F (36.9 C) (Oral)   Resp 15   Ht 5' 0.98 (1.549 m)   Wt 90 lb 6.4 oz (41 kg)   SpO2 98%   BMI 17.09 kg/m   Wt Readings from Last 3 Encounters:  09/03/23 90 lb 6.4 oz (41 kg)  06/17/23 86 lb 9.6 oz (39.3 kg)  06/07/23 89 lb (40.4 kg)    Physical Exam Vitals and nursing note reviewed.  Constitutional:      General: She is awake. She is not in acute distress.    Appearance: She is well-developed and well-groomed. She is not ill-appearing or toxic-appearing.  HENT:     Head: Normocephalic.     Right Ear: Hearing and external ear normal.     Left Ear: Hearing and external ear normal.   Eyes:     General: Lids are normal.        Right eye: No discharge.        Left eye: No discharge.     Conjunctiva/sclera: Conjunctivae normal.     Pupils: Pupils are equal, round, and reactive to light.   Neck:     Thyroid : No thyromegaly.     Vascular: No carotid bruit.   Cardiovascular:     Rate and Rhythm: Normal rate and regular rhythm.     Heart sounds: Normal heart sounds. No murmur heard.    No gallop.  Pulmonary:     Effort: Pulmonary effort is normal. No accessory muscle usage or respiratory distress.     Breath sounds: Normal breath sounds.  Abdominal:     General: Bowel sounds are normal. There is no distension.     Palpations: Abdomen is soft.     Tenderness: There is no abdominal tenderness.   Musculoskeletal:     Cervical back: Normal range of motion and neck supple.     Right lower leg: No edema.     Left lower leg: No edema.  Lymphadenopathy:     Cervical: No cervical adenopathy.   Skin:    General: Skin is warm and dry.   Neurological:     Mental Status: She is alert and oriented to person, place, and time.     Deep Tendon Reflexes: Reflexes are normal and symmetric.     Reflex Scores:      Brachioradialis reflexes are 2+ on the right side and 2+ on the left  side.      Patellar reflexes are 2+ on the right side and 2+ on the left  side.  Psychiatric:        Attention and Perception: Attention normal.        Mood and Affect: Mood normal.        Speech: Speech normal.        Behavior: Behavior normal. Behavior is cooperative.        Thought Content: Thought content normal.    Results for orders placed or performed during the hospital encounter of 06/07/23  CBC   Collection Time: 06/07/23  2:41 PM  Result Value Ref Range   WBC 11.0 (H) 4.0 - 10.5 K/uL   RBC 4.03 3.87 - 5.11 MIL/uL   Hemoglobin 12.9 12.0 - 15.0 g/dL   HCT 59.8 63.9 - 53.9 %   MCV 99.5 80.0 - 100.0 fL   MCH 32.0 26.0 - 34.0 pg   MCHC 32.2 30.0 - 36.0 g/dL   RDW 85.9 88.4 - 84.4 %   Platelets 354 150 - 400 K/uL   nRBC 0.0 0.0 - 0.2 %  Resp panel by RT-PCR (RSV, Flu A&B, Covid) Anterior Nasal Swab   Collection Time: 06/07/23  3:24 PM   Specimen: Anterior Nasal Swab  Result Value Ref Range   SARS Coronavirus 2 by RT PCR NEGATIVE NEGATIVE   Influenza A by PCR NEGATIVE NEGATIVE   Influenza B by PCR NEGATIVE NEGATIVE   Resp Syncytial Virus by PCR NEGATIVE NEGATIVE  Urinalysis, Routine w reflex microscopic -Urine, Clean Catch   Collection Time: 06/07/23  3:24 PM  Result Value Ref Range   Color, Urine YELLOW (A) YELLOW   APPearance CLOUDY (A) CLEAR   Specific Gravity, Urine 1.012 1.005 - 1.030   pH 8.0 5.0 - 8.0   Glucose, UA NEGATIVE NEGATIVE mg/dL   Hgb urine dipstick NEGATIVE NEGATIVE   Bilirubin Urine NEGATIVE NEGATIVE   Ketones, ur 20 (A) NEGATIVE mg/dL   Protein, ur NEGATIVE NEGATIVE mg/dL   Nitrite NEGATIVE NEGATIVE   Leukocytes,Ua NEGATIVE NEGATIVE  Comprehensive metabolic panel with GFR   Collection Time: 06/07/23  4:06 PM  Result Value Ref Range   Sodium 138 135 - 145 mmol/L   Potassium 3.9 3.5 - 5.1 mmol/L   Chloride 102 98 - 111 mmol/L   CO2 25 22 - 32 mmol/L   Glucose, Bld 131 (H) 70 - 99 mg/dL   BUN 19 8 - 23 mg/dL   Creatinine, Ser 9.15 0.44 -  1.00 mg/dL   Calcium 9.1 8.9 - 89.6 mg/dL   Total Protein 7.1 6.5 - 8.1 g/dL   Albumin 4.1 3.5 - 5.0 g/dL   AST 22 15 - 41 U/L   ALT 12 0 - 44 U/L   Alkaline Phosphatase 45 38 - 126 U/L   Total Bilirubin 0.7 0.0 - 1.2 mg/dL   GFR, Estimated >39 >39 mL/min   Anion gap 11 5 - 15  Lipase, blood   Collection Time: 06/07/23  4:06 PM  Result Value Ref Range   Lipase 42 11 - 51 U/L      Assessment & Plan:   Problem List Items Addressed This Visit       Cardiovascular and Mediastinum   Essential hypertension - Primary   BP remains elevated on recheck and has trended up since her gall bladder surgery.  Discussed with patient at length.  Will start Olmesartan 10 MG daily, since she is a smoker [facility] preferred over ACE.  Educated her on medication and side effects to alert provider of.  Recommend she monitor BP at least  a few mornings a week at home and document.  DASH diet at home.  Continue current medication regimen and adjust as needed.  Labs today: obtain BMP next visit.       Relevant Medications   olmesartan (BENICAR) 5 MG tablet     Other   Long-term current use of benzodiazepine   Refer to anxiety plan of care.      Hyperlipidemia   Chronic, ongoing.  Continue current medication regimen and adjust as needed.  Lipid panel at physical.      Relevant Medications   olmesartan (BENICAR) 5 MG tablet   Generalized anxiety disorder   Chronic, stable.  Only taking Xanax  0.25 MG at night 5 days a week.  Have sent in refill 0.25 MG QHS #90 tablets with 0 refills recently.  Had discussion about BEERS criteria and risks with these.   Will continue to monitor, she is up to date on contract and UDS due 05/27/24 next.  Continue Lexapro .  She agrees to every 3 month visits, will return in 3 months.      Relevant Medications   ALPRAZolam  (XANAX ) 0.25 MG tablet       Follow up plan: Return in about 5 weeks (around 10/11/2023) for HTN -- added Olmesartan.

## 2023-09-03 NOTE — Assessment & Plan Note (Signed)
 Refer to anxiety plan of care.

## 2023-09-03 NOTE — Assessment & Plan Note (Signed)
Chronic, ongoing.  Continue current medication regimen and adjust as needed.  Lipid panel at physical.

## 2023-09-30 ENCOUNTER — Other Ambulatory Visit: Payer: Self-pay

## 2023-09-30 DIAGNOSIS — K5732 Diverticulitis of large intestine without perforation or abscess without bleeding: Secondary | ICD-10-CM

## 2023-10-04 ENCOUNTER — Telehealth: Payer: Self-pay

## 2023-10-04 ENCOUNTER — Other Ambulatory Visit: Payer: Self-pay

## 2023-10-04 DIAGNOSIS — K5732 Diverticulitis of large intestine without perforation or abscess without bleeding: Secondary | ICD-10-CM

## 2023-10-04 MED ORDER — NA SULFATE-K SULFATE-MG SULF 17.5-3.13-1.6 GM/177ML PO SOLN: 1.0000 | Freq: Once | ORAL | 0 refills | Status: AC

## 2023-10-04 NOTE — Telephone Encounter (Signed)
 Gastroenterology Pre-Procedure Review  Request Date: 12/08/23 Requesting Physician: Dr. Marinda  PATIENT REVIEW QUESTIONS: The patient responded to the following health history questions as indicated:    1. Are you having any GI issues? yes (Diverticulitis) 2. Do you have a personal history of Polyps? no 3. Do you have a family history of Colon Cancer or Polyps? no 4. Diabetes Mellitus? no 5. Joint replacements in the past 12 months?no 6. Major health problems in the past 3 months?no 7. Any artificial heart valves, MVP, or defibrillator?no    MEDICATIONS & ALLERGIES:    Patient reports the following regarding taking any anticoagulation/antiplatelet therapy:   Plavix, Coumadin, Eliquis, Xarelto, Lovenox , Pradaxa, Brilinta, or Effient? no Aspirin? no  Patient confirms/reports the following medications:  Current Outpatient Medications  Medication Sig Dispense Refill   ALPRAZolam  (XANAX ) 0.25 MG tablet Take 1 tablet (0.25 mg total) by mouth at bedtime as needed for anxiety. 90 tablet 0   cholecalciferol (VITAMIN D3) 25 MCG (1000 UNIT) tablet Take 1,000 Units by mouth daily.     escitalopram  (LEXAPRO ) 10 MG tablet Take 1 tablet (10 mg total) by mouth daily. 90 tablet 4   Multiple Vitamin (MULTIVITAMIN) tablet Take 1 tablet by mouth daily.     olmesartan  (BENICAR ) 5 MG tablet Take 2 tablets (10 mg total) by mouth daily. 120 tablet 2   simvastatin  (ZOCOR ) 20 MG tablet Take 1 tablet (20 mg total) by mouth daily at 6 PM. 90 tablet 4   No current facility-administered medications for this visit.    Patient confirms/reports the following allergies:  Allergies  Allergen Reactions   Sulfa Antibiotics Other (See Comments)    Per patient, unknown reaction    No orders of the defined types were placed in this encounter.   AUTHORIZATION INFORMATION Primary Insurance: 1D#: Group #:  Secondary Insurance: 1D#: Group #:  SCHEDULE INFORMATION: Date: 12/08/23 Time: Location: [facility]

## 2023-10-06 NOTE — Telephone Encounter (Signed)
 hey emily another question please this one is on Brandy Parker Oct 18, 2050 I've scheduled her colonoscopy with Dr. Marinda 12/08/23 what diagnosis should I use? Z12.11 Screening since she has not had one in a long time or is it post antibiotic to check healing from diverticulitis  Mon 3:10 PM E[facility] Damien JAYSON Lesches, CMA hey sorry, not sure how I missed seeing this, but yeah screening since she is due for one   Diagnosis changed to screening in patients order and referral note.  Rosaline, CMA

## 2023-10-24 NOTE — Patient Instructions (Signed)
 Be Involved in Caring For Your Health:  Taking Medications When medications are taken as directed, they can greatly improve your health. But if they are not taken as prescribed, they may not work. In some cases, not taking them correctly can be harmful. To help ensure your treatment remains effective and safe, understand your medications and how to take them. Bring your medications to each visit for review by your provider.  Your lab results, notes, and after visit summary will be available on My Chart. We strongly encourage you to use this feature. If lab results are abnormal the clinic will contact you with the appropriate steps. If the clinic does not contact you assume the results are satisfactory. You can always view your results on My Chart. If you have questions regarding your health or results, please contact the clinic during office hours. You can also ask questions on My Chart.  We at [facility] are grateful that you chose us  to provide your care. We strive to provide evidence-based and compassionate care and are always looking for feedback. If you get a survey from the clinic please complete this so we can hear your opinions.  DASH Eating Plan DASH stands for Dietary Approaches to Stop Hypertension. The DASH eating plan is a healthy eating plan that has been shown to: Lower high blood pressure (hypertension). Reduce your risk for type 2 diabetes, heart disease, and stroke. Help with weight loss. What are tips for following this plan? Reading food labels Check food labels for the amount of salt (sodium) per serving. Choose foods with less than 5 percent of the Daily Value (DV) of sodium. In general, foods with less than 300 milligrams (mg) of sodium per serving fit into this eating plan. To find whole grains, look for the word whole as the first word in the ingredient list. Shopping Buy products labeled as low-sodium or no salt added. Buy fresh foods. Avoid canned  foods and pre-made or frozen meals. Cooking Try not to add salt when you cook. Use salt-free seasonings or herbs instead of table salt or sea salt. Check with your health care provider or pharmacist before using salt substitutes. Do not fry foods. Cook foods in healthy ways, such as baking, boiling, grilling, roasting, or broiling. Cook using oils that are good for your heart. These include olive, canola, avocado, soybean, and sunflower oil. Meal planning  Eat a balanced diet. This should include: 4 or more servings of fruits and 4 or more servings of vegetables each day. Try to fill half of your plate with fruits and vegetables. 6-8 servings of whole grains each day. 6 or less servings of lean meat, poultry, or fish each day. 1 oz is 1 serving. A 3 oz (85 g) serving of meat is about the same size as the palm of your hand. One egg is 1 oz (28 g). 2-3 servings of low-fat dairy each day. One serving is 1 cup (237 mL). 1 serving of nuts, seeds, or beans 5 times each week. 2-3 servings of heart-healthy fats. Healthy fats called omega-3 fatty acids are found in foods such as walnuts, flaxseeds, fortified milks, and eggs. These fats are also found in cold-water fish, such as sardines, salmon, and mackerel. Limit how much you eat of: Canned or prepackaged foods. Food that is high in trans fat, such as fried foods. Food that is high in saturated fat, such as fatty meat. Desserts and other sweets, sugary drinks, and other foods with added sugar. Full-fat  dairy products. Do not salt foods before eating. Do not eat more than 4 egg yolks a week. Try to eat at least 2 vegetarian meals a week. Eat more home-cooked food and less restaurant, buffet, and fast food. Lifestyle When eating at a restaurant, ask if your food can be made with less salt or no salt. If you drink alcohol: Limit how much you have to: 0-1 drink a day if you are female. 0-2 drinks a day if you are female. Know how much alcohol is in  your drink. In the [place] one drink is one 12 oz bottle of beer (355 mL), one 5 oz glass of wine (148 mL), or one 1 oz glass of hard liquor (44 mL). General information Avoid eating more than 2,300 mg of salt a day. If you have hypertension, you may need to reduce your sodium intake to 1,500 mg a day. Work with your provider to stay at a healthy body weight or lose weight. Ask what the best weight range is for you. On most days of the week, get at least 30 minutes of exercise that causes your heart to beat faster. This may include walking, swimming, or biking. Work with your provider or dietitian to adjust your eating plan to meet your specific calorie needs. What foods should I eat? Fruits All fresh, dried, or frozen fruit. Canned fruits that are in their natural juice and do not have sugar added to them. Vegetables Fresh or frozen vegetables that are raw, steamed, roasted, or grilled. Low-sodium or reduced-sodium tomato and vegetable juice. Low-sodium or reduced-sodium tomato sauce and tomato paste. Low-sodium or reduced-sodium canned vegetables. Grains Whole-grain or whole-wheat bread. Whole-grain or whole-wheat pasta. Brown rice. [place]ira. Bulgur. Whole-grain and low-sodium cereals. Pita bread. Low-fat, low-sodium crackers. Whole-wheat flour tortillas. Meats and other proteins Skinless chicken or [place]. Ground chicken or [place]. Pork with fat trimmed off. Fish and seafood. Egg whites. Dried beans, peas, or lentils. Unsalted nuts, nut butters, and seeds. Unsalted canned beans. Lean cuts of beef with fat trimmed off. Low-sodium, lean precooked or cured meat, such as sausages or meat loaves. Dairy Low-fat (1%) or fat-free (skim) milk. Reduced-fat, low-fat, or fat-free cheeses. Nonfat, low-sodium ricotta or cottage cheese. Low-fat or nonfat yogurt. Low-fat, low-sodium cheese. Fats and oils Soft margarine without trans fats. Vegetable oil. Reduced-fat, low-fat, or light mayonnaise and salad  dressings (reduced-sodium). Canola, safflower, olive, avocado, soybean, and sunflower oils. Avocado. Seasonings and condiments Herbs. Spices. Seasoning mixes without salt. Other foods Unsalted popcorn and pretzels. Fat-free sweets. The items listed above may not be all the foods and drinks you can have. Talk to a dietitian to learn more. What foods should I avoid? Fruits Canned fruit in a light or heavy syrup. Fried fruit. Fruit in cream or butter sauce. Vegetables Creamed or fried vegetables. Vegetables in a cheese sauce. Regular canned vegetables that are not marked as low-sodium or reduced-sodium. Regular canned tomato sauce and paste that are not marked as low-sodium or reduced-sodium. Regular tomato and vegetable juices that are not marked as low-sodium or reduced-sodium. Dene. Olives. Grains Baked goods made with fat, such as croissants, muffins, or some breads. Dry pasta or rice meal packs. Meats and other proteins Fatty cuts of meat. Ribs. Fried meat. [place]. Bologna, salami, and other precooked or cured meats, such as sausages or meat loaves, that are not lean and low in sodium. Fat from the back of a pig (fatback). Bratwurst. Salted nuts and seeds. Canned beans with added salt. Canned  or smoked fish. Whole eggs or egg yolks. Chicken or [place] with skin. Dairy Whole or 2% milk, cream, and half-and-half. Whole or full-fat cream cheese. Whole-fat or sweetened yogurt. Full-fat cheese. Nondairy creamers. Whipped toppings. Processed cheese and cheese spreads. Fats and oils Butter. Stick margarine. Lard. Shortening. Ghee. Bacon fat. Tropical oils, such as coconut, palm kernel, or palm oil. Seasonings and condiments Onion salt, garlic salt, seasoned salt, table salt, and sea salt. Worcestershire sauce. Tartar sauce. Barbecue sauce. Teriyaki sauce. Soy sauce, including reduced-sodium soy sauce. Steak sauce. Canned and packaged gravies. Fish sauce. Oyster sauce. Cocktail sauce. Store-bought  horseradish. Ketchup. Mustard. Meat flavorings and tenderizers. Bouillon cubes. Hot sauces. Pre-made or packaged marinades. Pre-made or packaged taco seasonings. Relishes. Regular salad dressings. Other foods Salted popcorn and pretzels. The items listed above may not be all the foods and drinks you should avoid. Talk to a dietitian to learn more. Where to find more information National Heart, Lung, and Blood Institute (NHLBI): BuffaloDryCleaner.gl American Heart Association (AHA): heart.org Academy of Nutrition and Dietetics: eatright.org National Kidney Foundation (NKF): kidney.org This information is not intended to replace advice given to you by your health care provider. Make sure you discuss any questions you have with your health care provider. Document Revised: 03/12/2022 Document Reviewed: 03/12/2022 Elsevier Patient Education  2024 [employer].

## 2023-10-29 ENCOUNTER — Ambulatory Visit (INDEPENDENT_AMBULATORY_CARE_PROVIDER_SITE_OTHER): Admitting: Nurse Practitioner

## 2023-10-29 ENCOUNTER — Encounter: Payer: Self-pay | Admitting: Nurse Practitioner

## 2023-10-29 VITALS — BP 128/66 | HR 69 | Temp 98.8°F | Ht 61.0 in | Wt 89.6 lb

## 2023-10-29 DIAGNOSIS — I10 Essential (primary) hypertension: Secondary | ICD-10-CM | POA: Diagnosis not present

## 2023-10-29 MED ORDER — OLMESARTAN MEDOXOMIL 20 MG PO TABS: 10.0000 mg | ORAL_TABLET | Freq: Every day | ORAL | 4 refills | Status: AC

## 2023-10-29 NOTE — Progress Notes (Signed)
 BP 128/66 (BP Location: Left Arm)   Pulse 69   Temp 98.8 F (37.1 C) (Oral)   Ht 5' 1 (1.549 m)   Wt 89 lb 9.6 oz (40.6 kg)   SpO2 98%   BMI 16.93 kg/m    Subjective:    Patient ID: Brandy Parker, female    DOB: Jun 27, 1950, 73 y.o.   MRN: [id number]  HPI: Brandy Parker is a 73 y.o. female  Chief Complaint  Patient presents with   Hypertension   HYPERTENSION without Chronic Kidney Disease Presents today to follow-up on BP, added Olmesartan  10 MG on 09/03/23. Has had no ADR with this.  Did not realize how bad she had felt until she started this. Smokes 5-6 cigarettes a day. Has done this for years. Hypertension status: stable  Satisfied with current treatment? yes Duration of hypertension: chronic BP monitoring frequency:  not checking BP range:  BP medication side effects:  no Medication compliance: good compliance Aspirin: no Recurrent headaches: no Visual changes: no Palpitations: no Dyspnea: no Chest pain: no Lower extremity edema: no Dizzy/lightheaded: no   Relevant past medical, surgical, family and social history reviewed and updated as indicated. Interim medical history since our last visit reviewed. Allergies and medications reviewed and updated.  Review of Systems  Constitutional:  Negative for activity change, appetite change, diaphoresis, fatigue and fever.  HENT:  Negative for ear discharge and ear pain.   Respiratory:  Negative for cough, chest tightness and shortness of breath.   Cardiovascular:  Negative for chest pain, palpitations and leg swelling.  Gastrointestinal: Negative.   Neurological: Negative.   Psychiatric/Behavioral:  Negative for decreased concentration, self-injury, sleep disturbance and suicidal ideas. The patient is not nervous/anxious.     Per HPI unless specifically indicated above     Objective:    BP 128/66 (BP Location: Left Arm)   Pulse 69   Temp 98.8 F (37.1 C) (Oral)   Ht 5' 1 (1.549 m)   Wt 89 lb 9.6 oz (40.6  kg)   SpO2 98%   BMI 16.93 kg/m   Wt Readings from Last 3 Encounters:  10/29/23 89 lb 9.6 oz (40.6 kg)  09/03/23 90 lb 6.4 oz (41 kg)  06/17/23 86 lb 9.6 oz (39.3 kg)    Physical Exam Vitals and nursing note reviewed.  Constitutional:      General: She is awake. She is not in acute distress.    Appearance: She is well-developed and well-groomed. She is not ill-appearing or toxic-appearing.  HENT:     Head: Normocephalic.     Right Ear: Hearing and external ear normal.     Left Ear: Hearing and external ear normal.  Eyes:     General: Lids are normal.        Right eye: No discharge.        Left eye: No discharge.     Conjunctiva/sclera: Conjunctivae normal.     Pupils: Pupils are equal, round, and reactive to light.  Neck:     Thyroid : No thyromegaly.     Vascular: No carotid bruit.  Cardiovascular:     Rate and Rhythm: Normal rate and regular rhythm.     Heart sounds: Normal heart sounds. No murmur heard.    No gallop.  Pulmonary:     Effort: Pulmonary effort is normal. No accessory muscle usage or respiratory distress.     Breath sounds: Normal breath sounds.  Abdominal:     General: Bowel sounds are normal.  There is no distension.     Palpations: Abdomen is soft.     Tenderness: There is no abdominal tenderness.  Musculoskeletal:     Cervical back: Normal range of motion and neck supple.     Right lower leg: No edema.     Left lower leg: No edema.  Lymphadenopathy:     Cervical: No cervical adenopathy.  Skin:    General: Skin is warm and dry.  Neurological:     Mental Status: She is alert and oriented to person, place, and time.     Deep Tendon Reflexes: Reflexes are normal and symmetric.     Reflex Scores:      Brachioradialis reflexes are 2+ on the right side and 2+ on the left side.      Patellar reflexes are 2+ on the right side and 2+ on the left side. Psychiatric:        Attention and Perception: Attention normal.        Mood and Affect: Mood normal.         Speech: Speech normal.        Behavior: Behavior normal. Behavior is cooperative.        Thought Content: Thought content normal.     Results for orders placed or performed during the hospital encounter of 06/07/23  CBC   Collection Time: 06/07/23  2:41 PM  Result Value Ref Range   WBC 11.0 (H) 4.0 - 10.5 K/uL   RBC 4.03 3.87 - 5.11 MIL/uL   Hemoglobin 12.9 12.0 - 15.0 g/dL   HCT 59.8 63.9 - 53.9 %   MCV 99.5 80.0 - 100.0 fL   MCH 32.0 26.0 - 34.0 pg   MCHC 32.2 30.0 - 36.0 g/dL   RDW 85.9 88.4 - 84.4 %   Platelets 354 150 - 400 K/uL   nRBC 0.0 0.0 - 0.2 %  Resp panel by RT-PCR (RSV, Flu A&B, Covid) Anterior Nasal Swab   Collection Time: 06/07/23  3:24 PM   Specimen: Anterior Nasal Swab  Result Value Ref Range   SARS Coronavirus 2 by RT PCR NEGATIVE NEGATIVE   Influenza A by PCR NEGATIVE NEGATIVE   Influenza B by PCR NEGATIVE NEGATIVE   Resp Syncytial Virus by PCR NEGATIVE NEGATIVE  Urinalysis, Routine w reflex microscopic -Urine, Clean Catch   Collection Time: 06/07/23  3:24 PM  Result Value Ref Range   Color, Urine YELLOW (A) YELLOW   APPearance CLOUDY (A) CLEAR   Specific Gravity, Urine 1.012 1.005 - 1.030   pH 8.0 5.0 - 8.0   Glucose, UA NEGATIVE NEGATIVE mg/dL   Hgb urine dipstick NEGATIVE NEGATIVE   Bilirubin Urine NEGATIVE NEGATIVE   Ketones, ur 20 (A) NEGATIVE mg/dL   Protein, ur NEGATIVE NEGATIVE mg/dL   Nitrite NEGATIVE NEGATIVE   Leukocytes,Ua NEGATIVE NEGATIVE  Comprehensive metabolic panel with GFR   Collection Time: 06/07/23  4:06 PM  Result Value Ref Range   Sodium 138 135 - 145 mmol/L   Potassium 3.9 3.5 - 5.1 mmol/L   Chloride 102 98 - 111 mmol/L   CO2 25 22 - 32 mmol/L   Glucose, Bld 131 (H) 70 - 99 mg/dL   BUN 19 8 - 23 mg/dL   Creatinine, Ser 9.15 0.44 - 1.00 mg/dL   Calcium 9.1 8.9 - 89.6 mg/dL   Total Protein 7.1 6.5 - 8.1 g/dL   Albumin 4.1 3.5 - 5.0 g/dL   AST 22 15 - 41 U/L   ALT 12  0 - 44 U/L   Alkaline Phosphatase 45 38 - 126  U/L   Total Bilirubin 0.7 0.0 - 1.2 mg/dL   GFR, Estimated >39 >39 mL/min   Anion gap 11 5 - 15  Lipase, blood   Collection Time: 06/07/23  4:06 PM  Result Value Ref Range   Lipase 42 11 - 51 U/L      Assessment & Plan:   Problem List Items Addressed This Visit       Cardiovascular and Mediastinum   Essential hypertension - Primary   Chronic and improving.  Will continue Olmesartan  10 MG daily, since she is a smoker ARB preferred over ACE.  Educated her on medication and side effects to alert provider of.  Recommend she monitor BP at least a few mornings a week at home and document.  DASH diet at home.  Continue current medication regimen and adjust as needed.  Labs today: BMP to check K+.       Relevant Medications   olmesartan  (BENICAR ) 20 MG tablet   Other Relevant Orders   Basic metabolic panel with GFR     Follow up plan: Return in about 2 months (around 12/29/2023) for HTN/HLD, ANXIETY.

## 2023-10-29 NOTE — Assessment & Plan Note (Signed)
 Chronic and improving.  Will continue Olmesartan  10 MG daily, since she is a smoker ARB preferred over ACE.  Educated her on medication and side effects to alert provider of.  Recommend she monitor BP at least a few mornings a week at home and document.  DASH diet at home.  Continue current medication regimen and adjust as needed.  Labs today: BMP to check K+.

## 2023-10-30 ENCOUNTER — Ambulatory Visit: Payer: Self-pay | Admitting: Nurse Practitioner

## 2023-10-30 LAB — BASIC METABOLIC PANEL WITH GFR
BUN/Creatinine Ratio: 19 (ref 12–28)
BUN: 24 mg/dL (ref 8–27)
CO2: 25 mmol/L (ref 20–29)
Calcium: 10.1 mg/dL (ref 8.7–10.3)
Chloride: 103 mmol/L (ref 96–106)
Creatinine, Ser: 1.29 mg/dL — ABNORMAL HIGH (ref 0.57–1.00)
Glucose: 84 mg/dL (ref 70–99)
Potassium: 4.6 mmol/L (ref 3.5–5.2)
Sodium: 143 mmol/L (ref 134–144)
eGFR: 44 mL/min/1.73 — ABNORMAL LOW (ref >59)

## 2023-10-30 NOTE — Progress Notes (Signed)
 Good morning, patient does not always check MyChart so please call to ensure she received below message: Good day Brandy Parker, your labs have returned.  Kidney function, creatinine and eGFR, has dropped a little this check.  I would like you to increase water intake at home and even add a little lemon to this.  I do not suspect the medication caused this, but we will recheck next visit to ensure it trends back to your baseline.  Any questions? Keep being amazing!!  Thank you for allowing me to participate in your care.  I appreciate you. Kindest regards, Lynda Capistran

## 2023-12-08 ENCOUNTER — Ambulatory Visit
Admission: RE | Admit: 2023-12-08 | Discharge: 2023-12-08 | Disposition: A | Discharge status: Home / Self Care | Attending: General Surgery | Admitting: General Surgery

## 2023-12-08 ENCOUNTER — Ambulatory Visit: Payer: Self-pay | Admitting: Certified Registered"

## 2023-12-08 ENCOUNTER — Encounter: Payer: Self-pay | Admitting: General Surgery

## 2023-12-08 ENCOUNTER — Other Ambulatory Visit: Payer: Self-pay | Admitting: General Surgery

## 2023-12-08 ENCOUNTER — Telehealth: Payer: Self-pay

## 2023-12-08 ENCOUNTER — Encounter
Admission: RE | Disposition: A | Discharge status: Home / Self Care | Payer: Self-pay | Source: Home / Self Care | Attending: General Surgery

## 2023-12-08 DIAGNOSIS — K635 Polyp of colon: Secondary | ICD-10-CM | POA: Insufficient documentation

## 2023-12-08 DIAGNOSIS — Z09 Encounter for follow-up examination after completed treatment for conditions other than malignant neoplasm: Secondary | ICD-10-CM | POA: Diagnosis present

## 2023-12-08 DIAGNOSIS — F17210 Nicotine dependence, cigarettes, uncomplicated: Secondary | ICD-10-CM | POA: Diagnosis not present

## 2023-12-08 DIAGNOSIS — I10 Essential (primary) hypertension: Secondary | ICD-10-CM | POA: Diagnosis not present

## 2023-12-08 DIAGNOSIS — I739 Peripheral vascular disease, unspecified: Secondary | ICD-10-CM | POA: Diagnosis not present

## 2023-12-08 DIAGNOSIS — F419 Anxiety disorder, unspecified: Secondary | ICD-10-CM | POA: Diagnosis not present

## 2023-12-08 DIAGNOSIS — K219 Gastro-esophageal reflux disease without esophagitis: Secondary | ICD-10-CM | POA: Insufficient documentation

## 2023-12-08 DIAGNOSIS — K5730 Diverticulosis of large intestine without perforation or abscess without bleeding: Secondary | ICD-10-CM | POA: Insufficient documentation

## 2023-12-08 DIAGNOSIS — J449 Chronic obstructive pulmonary disease, unspecified: Secondary | ICD-10-CM | POA: Diagnosis not present

## 2023-12-08 DIAGNOSIS — K5732 Diverticulitis of large intestine without perforation or abscess without bleeding: Secondary | ICD-10-CM | POA: Diagnosis not present

## 2023-12-08 DIAGNOSIS — Z1211 Encounter for screening for malignant neoplasm of colon: Secondary | ICD-10-CM | POA: Diagnosis not present

## 2023-12-08 HISTORY — PX: POLYPECTOMY: SHX149

## 2023-12-08 HISTORY — DX: Essential (primary) hypertension: I10

## 2023-12-08 HISTORY — PX: COLONOSCOPY: SHX5424

## 2023-12-08 SURGERY — COLONOSCOPY
Anesthesia: General

## 2023-12-08 MED ORDER — LIDOCAINE HCL (CARDIAC) PF 100 MG/5ML IV SOSY
PREFILLED_SYRINGE | INTRAVENOUS | Status: DC | PRN
  Administered 2023-12-08: 30 mg via INTRAVENOUS

## 2023-12-08 MED ORDER — PROPOFOL 10 MG/ML IV BOLUS
INTRAVENOUS | Status: DC | PRN
  Administered 2023-12-08: 10 mg via INTRAVENOUS
  Administered 2023-12-08: 20 mg via INTRAVENOUS
  Administered 2023-12-08: 15 mg via INTRAVENOUS

## 2023-12-08 MED ORDER — PROPOFOL 500 MG/50ML IV EMUL
INTRAVENOUS | Status: DC | PRN
  Administered 2023-12-08: 165 ug/kg/min via INTRAVENOUS

## 2023-12-08 MED ORDER — ESMOLOL HCL 100 MG/10ML IV SOLN
INTRAVENOUS | Status: DC | PRN
  Administered 2023-12-08 (×2): 10 mg via INTRAVENOUS

## 2023-12-08 MED ORDER — GLYCOPYRROLATE 0.2 MG/ML IJ SOLN
INTRAMUSCULAR | Status: DC | PRN
  Administered 2023-12-08: .2 mg via INTRAVENOUS

## 2023-12-08 MED ORDER — SODIUM CHLORIDE 0.9 % IV SOLN: INTRAVENOUS | Status: DC

## 2023-12-08 NOTE — Op Note (Signed)
 [facility] Gastroenterology Patient Name: Brandy Parker Procedure Date: 12/08/2023 9:59 AM MRN: [id number] Account #: [id number] Date of Birth: 03/28/1950 Admit Type: Outpatient Age: 73 Room: [facility] ENDO ROOM 1 Gender: Female Note Status: Finalized Instrument Name: Colon Scope [id number] Colonoscope [id number] Procedure:             Colonoscopy Indications:           Follow-up of diverticulitis Providers:             Jayson KIDD. Marinda, MD Medicines:             See the Anesthesia note for documentation of the                         administered medications Complications:         No immediate complications. Estimated blood loss:                         Minimal. Procedure:             Pre-Anesthesia Assessment:                        - Prior to the procedure, a History and Physical was                         performed, and patient medications and allergies were                         reviewed. The patient's tolerance of previous                         anesthesia was also reviewed. The risks and benefits                         of the procedure and the sedation options and risks                         were discussed with the patient. All questions were                         answered, and informed consent was obtained. Prior                         Anticoagulants: The patient has taken no anticoagulant                         or antiplatelet agents. ASA Grade Assessment: III - A                         patient with severe systemic disease. After reviewing                         the risks and benefits, the patient was deemed in                         satisfactory condition to undergo the procedure.                        After obtaining informed consent, the  colonoscope was                         passed under direct vision. Throughout the procedure,                         the patient's blood pressure, pulse, and oxygen                         saturations were  monitored continuously. The                         Colonoscope was introduced through the anus and                         advanced to the the cecum, identified by appendiceal                         orifice and ileocecal valve. The Colonoscope was                         introduced through the and advanced to the. The                         colonoscopy was technically difficult and complex due                         to multiple diverticula in the colon. Successful                         completion of the procedure was aided by withdrawing                         the scope and replacing with the pediatric endoscope                         and applying abdominal pressure. The quality of the                         bowel preparation was good. Findings:      The perianal and digital rectal examinations were normal.      Many large-mouthed diverticula were found in the sigmoid colon. There       was evidence of diverticular spasm.      A 5 mm polyp was found in the proximal sigmoid colon. The polyp was       sessile. The polyp was removed with a cold snare. Resection and       retrieval were complete.      A 3 mm polyp was found in the mid sigmoid colon. The polyp was sessile.       Biopsies were taken with a cold forceps for histology. Estimated blood       loss was minimal.      The exam was otherwise without abnormality on direct and retroflexion       views. Impression:            - Severe diverticulosis in the sigmoid colon. There  was evidence of diverticular spasm.                        - One 5 mm polyp in the proximal sigmoid colon,                         removed with a cold snare. Resected and retrieved.                        - One 3 mm polyp in the mid sigmoid colon. Biopsied.                        - The examination was otherwise normal on direct and                         retroflexion views. Recommendation:        - Discharge patient to home  (ambulatory).                        - Resume previous diet.                        - Repeat colonoscopy in 7-10 years for surveillance                         based on pathology results.                        - Return to Jayson Endow, MD in 2 weeks. Procedure Code(s):     --- Professional ---                        [phone], Colonoscopy, flexible; with removal of                         tumor(s), polyp(s), or other lesion(s) by snare                         technique                        [id number], 59, Colonoscopy, flexible; with biopsy, single                         or multiple CPT copyright 2022 American Medical Association. All rights reserved. The codes documented in this report are preliminary and upon coder review may  be revised to meet current compliance requirements. Jayson MALVA Endow, MD 12/08/2023 11:44:47 AM Number of Addenda: 0 Note Initiated On: 12/08/2023 9:59 AM Scope Withdrawal Time: 0 hours 8 minutes 53 seconds  Total Procedure Duration: 1 hour 21 minutes 13 seconds  Estimated Blood Loss:  Estimated blood loss was minimal.      [facility]

## 2023-12-08 NOTE — Anesthesia Preprocedure Evaluation (Signed)
 Anesthesia Evaluation  Patient identified by MRN, date of birth, ID band Patient awake    Reviewed: Allergy & Precautions, NPO status , Patient's Chart, lab work & pertinent test results  Airway Mallampati: II  TM Distance: >3 FB Neck ROM: full    Dental  (+) Teeth Intact   Pulmonary neg pulmonary ROS, COPD, Current Smoker   Pulmonary exam normal  + decreased breath sounds      Cardiovascular Exercise Tolerance: Good hypertension, Pt. on medications + Peripheral Vascular Disease  negative cardio ROS Normal cardiovascular exam Rhythm:Regular Rate:Normal     Neuro/Psych   Anxiety     negative neurological ROS  negative psych ROS   GI/Hepatic negative GI ROS, Neg liver ROS,GERD  Medicated,,  Endo/Other  negative endocrine ROS    Renal/GU negative Renal ROS  negative genitourinary   Musculoskeletal   Abdominal  (+) + scaphoid   Peds negative pediatric ROS (+)  Hematology negative hematology ROS (+)   Anesthesia Other Findings Past Medical History: No date: Anxiety No date: Cataract No date: GERD (gastroesophageal reflux disease) No date: Hyperlipemia No date: Hypertension  Past Surgical History: No date: APPENDECTOMY No date: FRACTURE SURGERY 07/20/2016: ORIF ANKLE FRACTURE; Left     Comment:  Procedure: OPEN REDUCTION INTERNAL FIXATION (ORIF) ANKLE              FRACTURE;  Surgeon: Leora Lynwood SAUNDERS, MD;  Location: [facility]               ORS;  Service: Orthopedics;  Laterality: Left; 04/20/2023: XI ROBOTIC LAPAROSCOPIC ASSISTED APPENDECTOMY; N/A     Comment:  Procedure: XI ROBOTIC LAPAROSCOPIC ASSISTED               APPENDECTOMY, OPEN ILEOCECECTOMY;  Surgeon: Tye Millet,              DO;  Location: [facility] ORS;  Service: General;  Laterality:               N/A;  BMI    Body Mass Index: 15.91 kg/m      Reproductive/Obstetrics negative OB ROS                              Anesthesia  Physical Anesthesia Plan  ASA: 3  Anesthesia Plan: General   Post-op Pain Management:    Induction: Intravenous  PONV Risk Score and Plan: Propofol  infusion and TIVA  Airway Management Planned: Natural Airway and Nasal Cannula  Additional Equipment:   Intra-op Plan:   Post-operative Plan:   Informed Consent: I have reviewed the patients History and Physical, chart, labs and discussed the procedure including the risks, benefits and alternatives for the proposed anesthesia with the patient or authorized representative who has indicated his/her understanding and acceptance.     Dental Advisory Given  Plan Discussed with: CRNA  Anesthesia Plan Comments:         Anesthesia Quick Evaluation

## 2023-12-08 NOTE — Anesthesia Procedure Notes (Signed)
 Procedure Name: General with mask airway Date/Time: 12/08/2023 10:06 AM  Performed by: Ledora Duncan, CRNAPre-anesthesia Checklist: Patient identified, Emergency Drugs available, Suction available and Patient being monitored Patient Re-evaluated:Patient Re-evaluated prior to induction Oxygen Delivery Method: Simple face mask Induction Type: IV induction Placement Confirmation: positive ETCO2 and breath sounds checked- equal and bilateral Dental Injury: Teeth and Oropharynx as per pre-operative assessment

## 2023-12-08 NOTE — Transfer of Care (Signed)
 Immediate Anesthesia Transfer of Care Note  Patient: Brandy Parker  Procedure(s) Performed: COLONOSCOPY POLYPECTOMY, INTESTINE  Patient Location: Endoscopy Unit  Anesthesia Type:General  Level of Consciousness: drowsy and patient cooperative  Airway & Oxygen Therapy: Patient Spontanous Breathing and Patient connected to face mask oxygen  Post-op Assessment: Report given to RN and Post -op Vital signs reviewed and stable  Post vital signs: Reviewed and stable  Last Vitals:  Vitals Value Taken Time  BP 175/91 12/08/23 11:33  Temp    Pulse 88 12/08/23 11:34  Resp 15 12/08/23 11:34  SpO2 100 % 12/08/23 11:34  Vitals shown include unfiled device data.  Last Pain:  Vitals:   12/08/23 1133  TempSrc: (P) Temporal  PainSc:          Complications: No notable events documented.

## 2023-12-08 NOTE — H&P (Signed)
 Primary Care Physician:  Valerio Melanie DASEN, NP Primary Gastroenterologist:  Dr. Marinda  Pre-Procedure History & Physical: HPI:  Brandy Parker is a 73 y.o. female is here for an colonoscopy.   Past Medical History:  Diagnosis Date   Anxiety    Cataract    GERD (gastroesophageal reflux disease)    Hyperlipemia    Hypertension     Past Surgical History:  Procedure Laterality Date   APPENDECTOMY     FRACTURE SURGERY     ORIF ANKLE FRACTURE Left 07/20/2016   Procedure: OPEN REDUCTION INTERNAL FIXATION (ORIF) ANKLE FRACTURE;  Surgeon: Leora Lynwood SAUNDERS, MD;  Location: [facility] ORS;  Service: Orthopedics;  Laterality: Left;   XI ROBOTIC LAPAROSCOPIC ASSISTED APPENDECTOMY N/A 04/20/2023   Procedure: XI ROBOTIC LAPAROSCOPIC ASSISTED APPENDECTOMY, OPEN ILEOCECECTOMY;  Surgeon: Tye Millet, DO;  Location: [facility] ORS;  Service: General;  Laterality: N/A;    Prior to Admission medications   Medication Sig Start Date End Date Taking? Authorizing Provider  ALPRAZolam  (XANAX ) 0.25 MG tablet Take 1 tablet (0.25 mg total) by mouth at bedtime as needed for anxiety. 09/03/23  Yes Cannady, Jolene T, NP  cholecalciferol (VITAMIN D3) 25 MCG (1000 UNIT) tablet Take 1,000 Units by mouth daily.   Yes [provider]  escitalopram  (LEXAPRO ) 10 MG tablet Take 1 tablet (10 mg total) by mouth daily. 05/28/23  Yes Cannady, Jolene T, NP  Multiple Vitamin (MULTIVITAMIN) tablet Take 1 tablet by mouth daily.   Yes [provider]  olmesartan  (BENICAR ) 20 MG tablet Take 0.5 tablets (10 mg total) by mouth daily. 10/29/23  Yes Cannady, Melanie T, NP  simvastatin  (ZOCOR ) 20 MG tablet Take 1 tablet (20 mg total) by mouth daily at 6 PM. 05/28/23  Yes Cannady, Jolene T, NP    Allergies as of 10/04/2023 - Review Complete 09/03/2023  Allergen Reaction Noted   Sulfa antibiotics Other (See Comments) 07/17/2016    Family History  Problem Relation Age of Onset   Hypertension Mother    Heart failure Mother     Hypertension Father    Hyperlipidemia Father    Cancer Father        bladder   Heart failure Father    Cancer Sister        bladder   Breast cancer Neg Hx     Social History   Socioeconomic History   Marital status: Single    Spouse name: Not on file   Number of children: Not on file   Years of education: Not on file   Highest education level: 12th grade  Occupational History   Occupation: fulltime     Employer: [employer]  Tobacco Use   Smoking status: Every Day    Current packs/day: 0.50    Average packs/day: 0.5 packs/day for 50.7 years (25.4 ttl pk-yrs)    Types: Cigarettes    Start date: 1975   Smokeless tobacco: Never  Vaping Use   Vaping status: Never Used  Substance and Sexual Activity   Alcohol use: No   Drug use: No   Sexual activity: Not Currently  Other Topics Concern   Not on file  Social History Narrative   Not on file   Social Drivers of Health   Financial Resource Strain: Low Risk  (06/05/2023)   Overall Financial Resource Strain (CARDIA)    Difficulty of Paying Living Expenses: Not hard at all  Food Insecurity: No Food Insecurity (06/07/2023)   Hunger Vital Sign    Worried About Running Out  of Food in the Last Year: Never true    Ran Out of Food in the Last Year: Never true  Transportation Needs: No Transportation Needs (06/05/2023)   PRAPARE - Administrator, Civil Service (Medical): No    Lack of Transportation (Non-Medical): No  Physical Activity: Insufficiently Active (06/05/2023)   Exercise Vital Sign    Days of Exercise per Week: 3 days    Minutes of Exercise per Session: 10 min  Stress: No Stress Concern Present (06/05/2023)   [employer] of Occupational Health - Occupational Stress Questionnaire    Feeling of Stress : Not at all  Social Connections: Socially Isolated (06/05/2023)   Social Connection and Isolation Panel    Frequency of Communication with Friends and Family: Once a week    Frequency of Social Gatherings with  Friends and Family: More than three times a week    Attends Religious Services: Never    Database administrator or Organizations: No    Attends Banker Meetings: Never    Marital Status: Never married  Intimate Partner Violence: Not At Risk (06/07/2023)   Humiliation, Afraid, Rape, and Kick questionnaire    Fear of Current or Ex-Partner: No    Emotionally Abused: No    Physically Abused: No    Sexually Abused: No    Review of Systems: See HPI, otherwise negative ROS  Physical Exam: BP 131/75   Pulse 83   Temp (!) 97.2 F (36.2 C) (Temporal)   Resp 16   Ht 5' 1.5 (1.562 m)   Wt 38.8 kg   SpO2 100%   BMI 15.91 kg/m  General:   Alert,  pleasant and cooperative in NAD Head:  Normocephalic and atraumatic. Neck:  Supple; no masses or thyromegaly. Lungs:  Clear throughout to auscultation.    Heart:  Regular rate and rhythm. Abdomen:  Soft, nontender and nondistended. Normal bowel sounds, without guarding, and without rebound.   Neurologic:  Alert and  oriented x4;  grossly normal neurologically.  Impression/Plan: SLOKA VOLANTE is here for an colonoscopy to be performed for diverticulitis  Risks, benefits, limitations, and alternatives regarding  colonoscopy have been reviewed with the patient.  Questions have been answered.  All parties agreeable.   Jayson MALVA Endow, MD  12/08/2023, 9:50 AM

## 2023-12-08 NOTE — Telephone Encounter (Signed)
 Pt called c/o abdominal soreness and bloating, also some intermittent nausea... Pt denies fever/chills, noticing blood from rectum.... Pt advised to monitor Sx and if pain becomes more sever, fever/chills to go to ED immediately... Pt encouraged to increase fluid, walk around to help release trapped air...   I will check on patient in the morning

## 2023-12-08 NOTE — Anesthesia Postprocedure Evaluation (Signed)
 Anesthesia Post Note  Patient: Brandy Parker  Procedure(s) Performed: COLONOSCOPY POLYPECTOMY, INTESTINE  Patient location during evaluation: PACU Anesthesia Type: General Level of consciousness: awake Pain management: pain level controlled Vital Signs Assessment: post-procedure vital signs reviewed and stable Respiratory status: nonlabored ventilation Cardiovascular status: stable Anesthetic complications: no   No notable events documented.   Last Vitals:  Vitals:   12/08/23 1143 12/08/23 1153  BP: (!) 179/88   Pulse: 95 91  Resp:    Temp:    SpO2: 95% 96%    Last Pain:  Vitals:   12/08/23 1153  TempSrc:   PainSc: 0-No pain                 VAN STAVEREN,Anamika Kueker

## 2023-12-10 LAB — SURGICAL PATHOLOGY

## 2023-12-10 NOTE — Telephone Encounter (Signed)
 Called pt to check on her and there was no answer and VM not setup

## 2023-12-21 ENCOUNTER — Ambulatory Visit: Admitting: General Surgery

## 2023-12-21 ENCOUNTER — Encounter: Payer: Self-pay | Admitting: General Surgery

## 2023-12-21 VITALS — BP 146/79 | HR 71 | Temp 99.0°F | Ht 61.0 in | Wt 89.0 lb

## 2023-12-21 DIAGNOSIS — K5730 Diverticulosis of large intestine without perforation or abscess without bleeding: Secondary | ICD-10-CM | POA: Diagnosis not present

## 2023-12-21 DIAGNOSIS — K5732 Diverticulitis of large intestine without perforation or abscess without bleeding: Secondary | ICD-10-CM

## 2023-12-21 NOTE — Patient Instructions (Signed)
 Diverticulitis  Diverticulitis is when small pouches in your colon get infected or swollen. This causes pain in your belly (abdomen) and watery poop (diarrhea). The small pouches are called diverticula. They may form if you have a condition called diverticulosis. What are the causes? You may get this condition if poop (stool) gets trapped in the pouches in your colon. The poop lets germs (bacteria) grow. This causes an infection. What increases the risk? You are more likely to get this condition if you have small pouches in your colon. You are also more likely to get it if: You are overweight or very overweight (obese). You do not exercise enough. You drink alcohol. You smoke. You eat a lot of red meat, like beef, pork, or lamb. You do not eat enough fiber. You are older than 75 years of age. What are the signs or symptoms? Pain in your belly. Pain is often on the left side, but it may be felt in other spots too. Fever and chills. Feeling like you may vomit. Vomiting. Having cramps. Feeling full. Changes in how often you poop. Blood in your poop. How is this treated? Most cases are treated at home. You may be told to: Take over-the-counter pain medicines. Only eat and drink clear liquids. Take antibiotics. Rest. Very bad cases may need to be treated at a hospital. Treatment may include: Not eating or drinking. Taking pain medicines. Getting antibiotics through an IV tube. Getting fluid and food through an IV tube. Having surgery. When you are feeling better, you may need to have a test to look at your colon (colonoscopy). Follow these instructions at home: Medicines Take over-the-counter and prescription medicines only as told by your doctor. These include: Fiber pills. Probiotics. Medicines to make your poop soft (stool softeners). If you were prescribed antibiotics, take them as told by your doctor. Do not stop taking them even if you start to feel better. Ask your  doctor if you should avoid driving or using machines while you are taking your medicine. Eating and drinking  Follow the diet told by your doctor. You may need to only eat and drink liquids. When you feel better, you may be able to eat more foods. You may also be told to eat a lot of fiber. Fiber helps you poop. Foods with fiber include berries, beans, lentils, and green vegetables. Try not to eat red meat. General instructions Do not smoke or use any products that contain nicotine or tobacco. If you need help quitting, ask your doctor. Exercise 3 or more times a week. Try to go for 30 minutes each time. Exercise enough to sweat and make your heart beat faster. Contact a doctor if: Your pain gets worse. You are not pooping like normal. Your symptoms do not get better. Your symptoms get worse very fast. You have a fever. You vomit more than one time. You have poop that is: Bloody. Black. Tarry. This information is not intended to replace advice given to you by your health care provider. Make sure you discuss any questions you have with your health care provider. Document Revised: 11/20/2021 Document Reviewed: 11/20/2021 Elsevier Patient Education  2024 [employer].

## 2023-12-21 NOTE — Progress Notes (Signed)
 Outpatient Surgical Follow Up  12/21/2023  Brandy Parker is an 73 y.o. female.   Chief Complaint  Patient presents with   New Patient (Initial Visit)    Diverticulitis     HPI: Patient returns today to discuss her diverticulosis.  She had a colonoscopy that was significant for a large amount of diverticulosis.  She was treated with antibiotics for diverticulitis several months ago.  She reports that since then she has not had any abdominal pain.  She denies any fevers or chills.  She denies any blood in her stool or changes in her stool habits.  Biopsies were nonconcerning from colonoscopy.  Past Medical History:  Diagnosis Date   Anxiety    Cataract    GERD (gastroesophageal reflux disease)    Hyperlipemia    Hypertension     Past Surgical History:  Procedure Laterality Date   APPENDECTOMY     COLONOSCOPY N/A 12/08/2023   Procedure: COLONOSCOPY;  Surgeon: Marinda Jayson KIDD, MD;  Location: [facility] ENDOSCOPY;  Service: General;  Laterality: N/A;   FRACTURE SURGERY     ORIF ANKLE FRACTURE Left 07/20/2016   Procedure: OPEN REDUCTION INTERNAL FIXATION (ORIF) ANKLE FRACTURE;  Surgeon: Leora Lynwood SAUNDERS, MD;  Location: [facility] ORS;  Service: Orthopedics;  Laterality: Left;   POLYPECTOMY  12/08/2023   Procedure: POLYPECTOMY, INTESTINE;  Surgeon: Marinda Jayson KIDD, MD;  Location: [facility] ENDOSCOPY;  Service: General;;   XI ROBOTIC LAPAROSCOPIC ASSISTED APPENDECTOMY N/A 04/20/2023   Procedure: XI ROBOTIC LAPAROSCOPIC ASSISTED APPENDECTOMY, OPEN ILEOCECECTOMY;  Surgeon: Tye Millet, DO;  Location: [facility] ORS;  Service: General;  Laterality: N/A;    Family History  Problem Relation Age of Onset   Hypertension Mother    Heart failure Mother    Hypertension Father    Hyperlipidemia Father    Cancer Father        bladder   Heart failure Father    Cancer Sister        bladder   Breast cancer Neg Hx     Social History:  reports that she has been smoking cigarettes. She started smoking about 50  years ago. She has a 25.4 pack-year smoking history. She has never used smokeless tobacco. She reports that she does not drink alcohol and does not use drugs.  Allergies:  Allergies  Allergen Reactions   Sulfa Antibiotics Other (See Comments)    Per patient, unknown reaction    Medications reviewed.    ROS Full ROS performed and is otherwise negative other than what is stated in HPI   BP (!) 146/79   Pulse 71   Temp 99 F (37.2 C) (Oral)   Ht 5' 1 (1.549 m)   Wt 89 lb (40.4 kg)   SpO2 99%   BMI 16.82 kg/m   Physical Exam  Alert and oriented x 3, no work of breathing room air, regular rate and rhythm, abdomen soft, nontender nondistended   No results found for this or any previous visit (from the past 48 hours). No results found.  Assessment/Plan:  Patient with diverticulosis.  I discussed treatment options with her about this.  1 option is to forego any surgical resection.  She did not have a bout of complicated diverticulitis that this is certainly reasonable.  However there I did discuss with her there is a chance that she may have worse diverticulitis that may prompt resection and colostomy or ileostomy placement if this is done in an emergent setting.  We discussed the recommendations would suggest  that surgery is not necessarily needed after a long bout of uncomplicated diverticulitis.  At this time she would like to avoid surgery at all cost.  She can follow-up with us  as needed.  I did discuss concerning symptoms including worsening abdominal pain, changes in bowel habits or bleeding per rectum as well as fevers and chills.   Jayson Endow, M.D. [facility]

## 2023-12-30 NOTE — Patient Instructions (Signed)
 Be Involved in Caring For Your Health:  Taking Medications When medications are taken as directed, they can greatly improve your health. But if they are not taken as prescribed, they may not work. In some cases, not taking them correctly can be harmful. To help ensure your treatment remains effective and safe, understand your medications and how to take them. Bring your medications to each visit for review by your provider.  Your lab results, notes, and after visit summary will be available on My Chart. We strongly encourage you to use this feature. If lab results are abnormal the clinic will contact you with the appropriate steps. If the clinic does not contact you assume the results are satisfactory. You can always view your results on My Chart. If you have questions regarding your health or results, please contact the clinic during office hours. You can also ask questions on My Chart.  We at [facility] are grateful that you chose [place] to provide your care. We strive to provide evidence-based and compassionate care and are always looking for feedback. If you get a survey from the clinic please complete this so we can hear your opinions.  Managing Anxiety, Adult After being diagnosed with anxiety, you may be relieved to know why you have felt or behaved a certain way. You may also feel overwhelmed about the treatment ahead and what it will mean for your life. With care and support, you can manage your anxiety. How to manage lifestyle changes Understanding the difference between stress and anxiety Although stress can play a role in anxiety, it is not the same as anxiety. Stress is your body's reaction to life changes and events, both good and bad. Stress is often caused by something external, such as a deadline, test, or competition. It normally goes away after the event has ended and will last just a few hours. But, stress can be ongoing and can lead to more than just stress. Anxiety is  caused by something internal, such as imagining a terrible outcome or worrying that something will go wrong that will greatly upset you. Anxiety often does not go away even after the event is over, and it can become a long-term (chronic) worry. Lowering stress and anxiety Talk with your health care provider or a counselor to learn more about lowering anxiety and stress. They may suggest tension-reduction techniques, such as: Music. Spend time creating or listening to music that you enjoy and that inspires you. Mindfulness-based meditation. Practice being aware of your normal breaths while not trying to control your breathing. It can be done while sitting or walking. Centering prayer. Focus on a word, phrase, or sacred image that means something to you and brings you peace. Deep breathing. Expand your stomach and inhale slowly through your nose. Hold your breath for 3-5 seconds. Then breathe out slowly, letting your stomach muscles relax. Self-talk. Learn to notice and spot thought patterns that lead to anxiety reactions. Change those patterns to thoughts that feel peaceful. Muscle relaxation. Take time to tense muscles and then relax them. Choose a tension-reduction technique that fits your lifestyle and personality. These techniques take time and practice. Set aside 5-15 minutes a day to do them. Specialized therapists can offer counseling and training in these techniques. The training to help with anxiety may be covered by some insurance plans. Other things you can do to manage stress and anxiety include: Keeping a stress diary. This can help you learn what triggers your reaction and then learn ways  to manage your response. Thinking about how you react to certain situations. You may not be able to control everything, but you can control your response. Making time for activities that help you relax and not feeling guilty about spending your time in this way. Doing visual imagery. This involves  imagining or creating mental pictures to help you relax. Practicing yoga. Through yoga poses, you can lower tension and relax.  Medicines Medicines for anxiety include: Antidepressant medicines. These are usually prescribed for long-term daily control. Anti-anxiety medicines. These may be added in severe cases, especially when panic attacks occur. When used together, medicines, psychotherapy, and tension-reduction techniques may be the most effective treatment. Relationships Relationships can play a big part in helping you recover. Spend more time connecting with trusted friends and family members. Think about going to couples counseling if you have a partner, taking family education classes, or going to family therapy. Therapy can help you and others better understand your anxiety. How to recognize changes in your anxiety Everyone responds differently to treatment for anxiety. Recovery from anxiety happens when symptoms lessen and stop interfering with your daily life at home or work. This may mean that you will start to: Have better concentration and focus. Worry will interfere less in your daily thinking. Sleep better. Be less irritable. Have more energy. Have improved memory. Try to recognize when your condition is getting worse. Contact your provider if your symptoms interfere with home or work and you feel like your condition is not improving. Follow these instructions at home: Activity Exercise. Adults should: Exercise for at least 150 minutes each week. The exercise should increase your heart rate and make you sweat (moderate-intensity exercise). Do strengthening exercises at least twice a week. Get the right amount and quality of sleep. Most adults need 7-9 hours of sleep each night. Lifestyle  Eat a healthy diet that includes plenty of vegetables, fruits, whole grains, low-fat dairy products, and lean protein. Do not eat a lot of foods that are high in fats, added sugars, or salt  (sodium). Make choices that simplify your life. Do not use any products that contain nicotine or tobacco. These products include cigarettes, chewing tobacco, and vaping devices, such as e-cigarettes. If you need help quitting, ask your provider. Avoid caffeine, alcohol, and certain over-the-counter cold medicines. These may make you feel worse. Ask your pharmacist which medicines to avoid. General instructions Take over-the-counter and prescription medicines only as told by your provider. Keep all follow-up visits. This is to make sure you are managing your anxiety well or if you need more support. Where to find support You can get help and support from: Self-help groups. Online and [employer]. A trusted spiritual leader. Couples counseling. Family education classes. Family therapy. Where to find more information You may find that joining a support group helps you deal with your anxiety. The following sources can help you find counselors or support groups near you: Mental Health [place]: mentalhealthamerica.net Anxiety and [employer] Association of [place] (ADAA): adaa.org The [employer] Mental Illness (NAMI): nami.org Contact a health care provider if: You have a hard time staying focused or finishing tasks. You spend many hours a day feeling worried about everyday life. You are very tired because you cannot stop worrying. You start to have headaches or often feel tense. You have chronic nausea or diarrhea. Get help right away if: Your heart feels like it is racing. You have shortness of breath. You have thoughts of hurting yourself or others. Get help  right away if you feel like you may hurt yourself or others, or have thoughts about taking your own life. Go to your nearest emergency room or: Call 911. Call the National Suicide Prevention Lifeline at 765-482-1593 or 988. This is open 24 hours a day. Text the Crisis Text Line at 504 124 9896. This information is not  intended to replace advice given to you by your health care provider. Make sure you discuss any questions you have with your health care provider. Document Revised: 12/02/2021 Document Reviewed: 06/16/2020 Elsevier Patient Education  2024 [employer].

## 2023-12-31 ENCOUNTER — Encounter: Payer: Self-pay | Admitting: Nurse Practitioner

## 2023-12-31 ENCOUNTER — Ambulatory Visit (INDEPENDENT_AMBULATORY_CARE_PROVIDER_SITE_OTHER): Admitting: Nurse Practitioner

## 2023-12-31 VITALS — BP 126/78 | HR 64 | Temp 98.2°F | Resp 14 | Ht 60.98 in | Wt 90.6 lb

## 2023-12-31 DIAGNOSIS — Z79899 Other long term (current) drug therapy: Secondary | ICD-10-CM

## 2023-12-31 DIAGNOSIS — E782 Mixed hyperlipidemia: Secondary | ICD-10-CM

## 2023-12-31 DIAGNOSIS — I10 Essential (primary) hypertension: Secondary | ICD-10-CM | POA: Diagnosis not present

## 2023-12-31 DIAGNOSIS — Z23 Encounter for immunization: Secondary | ICD-10-CM

## 2023-12-31 DIAGNOSIS — Z1231 Encounter for screening mammogram for malignant neoplasm of breast: Secondary | ICD-10-CM

## 2023-12-31 DIAGNOSIS — F17210 Nicotine dependence, cigarettes, uncomplicated: Secondary | ICD-10-CM

## 2023-12-31 DIAGNOSIS — F411 Generalized anxiety disorder: Secondary | ICD-10-CM | POA: Diagnosis not present

## 2023-12-31 MED ORDER — ALPRAZOLAM 0.25 MG PO TABS: 0.2500 mg | ORAL_TABLET | Freq: Every evening | ORAL | 0 refills | Status: AC | PRN

## 2023-12-31 NOTE — Assessment & Plan Note (Signed)
 Chronic and stable.  BP at goal on recheck today.  Will continue Olmesartan  10 MG daily, since she is a smoker ARB preferred over ACE.  Educated her on medication and side effects to alert provider of.  Recommend she monitor BP at least a few mornings a week at home and document.  DASH diet at home.  Continue current medication regimen and adjust as needed.  Labs today: up to date.

## 2023-12-31 NOTE — Assessment & Plan Note (Signed)
 Chronic, stable.  Only taking Xanax  0.25 MG at night 5 days a week.  Have sent in refill 0.25 MG QHS #90 tablets with 0 refills.  Had discussion about BEERS criteria and risks with these.   Will continue to monitor, she is up to date on contract and UDS due 05/27/24 next.  Continue Lexapro .  She agrees to every 3 month visits, will return in 3 months.

## 2023-12-31 NOTE — Assessment & Plan Note (Signed)
 Refer to anxiety plan of care.

## 2023-12-31 NOTE — Assessment & Plan Note (Signed)
Chronic, ongoing.  Continue current medication regimen and adjust as needed.  Lipid panel at physical.

## 2023-12-31 NOTE — Progress Notes (Signed)
 .cfpb   BP 126/78 (BP Location: Left Arm, Patient Position: Sitting, Cuff Size: Normal)   Pulse 64   Temp 98.2 F (36.8 C) (Oral)   Resp 14   Ht 5' 0.98 (1.549 m)   Wt 90 lb 9.6 oz (41.1 kg)   SpO2 100%   BMI 17.13 kg/m    Subjective:    Patient ID: Brandy Parker, female    DOB: 11/27/1950, 73 y.o.   MRN: [id number]  HPI: Brandy Parker is a 73 y.o. female  Chief Complaint  Patient presents with   HTN/HLD    No home checks.    Anxiety    It's ok and only uses the xanax  to help with sleeping.    HYPERTENSION / HYPERLIPIDEMIA Continues Olmesartan  and Simvastatin .  Smokes about 5-6 cigarettes daily.  Duration of hyperlipidemia: chronic Cholesterol medication side effects: no Cholesterol supplements: none Medication compliance: good compliance Aspirin: no Recent stressors: no Recurrent headaches: no Visual changes: no Palpitations: no Dyspnea: no Chest pain: no Lower extremity edema: no Dizzy/lightheaded: no   ANXIETY/STRESS Takes Xanax  0.25 MG PRN (5 nights a week) and Lexapro  10 MG daily.   HISTORY: Has been on Xanax  for a long while per her report, since 2008  Pt is aware of risks of benzo medication use to include increased sedation, respiratory suppression, falls, dependence and cardiovascular events. Pt would like to continue treatment as benefit determined to outweigh risk.  On PDMP review her last fill Xanax  was 09/03/23 for 90 day supply. Previously got 90 day supply from past provider.   Duration:stable Anxious mood: no  Excessive worrying: no Irritability: no  Sweating: no Nausea: no Palpitations:no Hyperventilation: no Panic attacks: no Agoraphobia: no  Obscessions/compulsions: no Depressed mood: no    10/29/2023   10:53 AM 09/03/2023    1:10 PM 05/28/2023    1:14 PM 02/12/2023    1:19 PM 11/13/2022    1:07 PM  Depression screen PHQ 2/9  Decreased Interest 0  0 0 0  Down, Depressed, Hopeless 0 0 0 0 0  PHQ - 2 Score 0 0 0 0 0  Altered sleeping 0  0 0 0 0  Tired, decreased energy 0 0 0 0 0  Change in appetite 0 0 0 0 0  Feeling bad or failure about yourself  0 0 0 0 0  Trouble concentrating 0 0 0 0 0  Moving slowly or fidgety/restless 0 0 0 0 0  Suicidal thoughts 0 0 0 0 0  PHQ-9 Score 0 0 0 0 0  Difficult doing work/chores Not difficult at all Not difficult at all Not difficult at all  Not difficult at all      10/29/2023   10:53 AM 09/03/2023    1:11 PM 02/12/2023    1:19 PM 11/13/2022    1:08 PM  GAD 7 : Generalized Anxiety Score  Nervous, Anxious, on Edge 0 0 0 0  Control/stop worrying 0 0 0 0  Worry too much - different things 0 0 0 0  Trouble relaxing 0 0 0 0  Restless 0 0 0 0  Easily annoyed or irritable 0 0 0 0  Afraid - awful might happen 0 0 0 0  Total GAD 7 Score 0 0 0 0  Anxiety Difficulty Not difficult at all Not difficult at all  Not difficult at all   Relevant past medical, surgical, family and social history reviewed and updated as indicated. Interim medical history since our last visit  reviewed. Allergies and medications reviewed and updated.  Review of Systems  Constitutional:  Negative for activity change, appetite change, diaphoresis, fatigue and fever.  HENT:  Negative for ear discharge and ear pain.   Respiratory:  Negative for cough, chest tightness and shortness of breath.   Cardiovascular:  Negative for chest pain, palpitations and leg swelling.  Gastrointestinal: Negative.   Neurological: Negative.   Psychiatric/Behavioral:  Negative for decreased concentration, self-injury, sleep disturbance and suicidal ideas. The patient is not nervous/anxious.    Per HPI unless specifically indicated above     Objective:    BP 126/78 (BP Location: Left Arm, Patient Position: Sitting, Cuff Size: Normal)   Pulse 64   Temp 98.2 F (36.8 C) (Oral)   Resp 14   Ht 5' 0.98 (1.549 m)   Wt 90 lb 9.6 oz (41.1 kg)   SpO2 100%   BMI 17.13 kg/m   Wt Readings from Last 3 Encounters:  12/31/23 90 lb 9.6 oz  (41.1 kg)  12/21/23 89 lb (40.4 kg)  12/08/23 85 lb 9.6 oz (38.8 kg)    Physical Exam Vitals and nursing note reviewed.  Constitutional:      General: She is awake. She is not in acute distress.    Appearance: She is well-developed and well-groomed. She is not ill-appearing or toxic-appearing.  HENT:     Head: Normocephalic.     Right Ear: Hearing and external ear normal.     Left Ear: Hearing and external ear normal.  Eyes:     General: Lids are normal.        Right eye: No discharge.        Left eye: No discharge.     Conjunctiva/sclera: Conjunctivae normal.     Pupils: Pupils are equal, round, and reactive to light.  Neck:     Thyroid : No thyromegaly.     Vascular: No carotid bruit.  Cardiovascular:     Rate and Rhythm: Normal rate and regular rhythm.     Heart sounds: Normal heart sounds. No murmur heard.    No gallop.  Pulmonary:     Effort: Pulmonary effort is normal. No accessory muscle usage or respiratory distress.     Breath sounds: Normal breath sounds.  Abdominal:     General: Bowel sounds are normal. There is no distension.     Palpations: Abdomen is soft.     Tenderness: There is no abdominal tenderness.  Musculoskeletal:     Cervical back: Normal range of motion and neck supple.     Right lower leg: No edema.     Left lower leg: No edema.  Lymphadenopathy:     Cervical: No cervical adenopathy.  Skin:    General: Skin is warm and dry.  Neurological:     Mental Status: She is alert and oriented to person, place, and time.     Deep Tendon Reflexes: Reflexes are normal and symmetric.     Reflex Scores:      Brachioradialis reflexes are 2+ on the right side and 2+ on the left side.      Patellar reflexes are 2+ on the right side and 2+ on the left side. Psychiatric:        Attention and Perception: Attention normal.        Mood and Affect: Mood normal.        Speech: Speech normal.        Behavior: Behavior normal. Behavior is cooperative.  Thought  Content: Thought content normal.    Results for orders placed or performed in visit on 12/08/23  Surgical pathology   Collection Time: 12/08/23 12:00 AM  Result Value Ref Range   SURGICAL PATHOLOGY      SURGICAL PATHOLOGY [facility] [address], [address] [place], [place] [address] Telephone [phone] or [phone] Fax [phone]  REPORT OF SURGICAL PATHOLOGY   Accession #: [id number] Patient Name: Brandy Parker, Brandy Parker Visit # :   MRN: [id number] Physician: Marinda Savant DOB/Age Apr 18, 1950 (Age: 10) Gender: F Collected Date: 12/08/2023 Received Date: 12/08/2023  FINAL DIAGNOSIS       1. Sigmoid  Colon Polyp, cold snare :       - HYPERPLASTIC POLYP       2. Sigmoid  Colon Polyp, cbx :       - POLYPOID COLONIC MUCOSA WITH MILD HYPERPLASTIC CHANGES      - NEGATIVE FOR DYSPLASIA      - MULTIPLE STEP SECTIONS WERE EXAMINED       ELECTRONIC SIGNATURE : Kashikar Md, Nilesh, Sports administrator, International aid/development worker  MICROSCOPIC DESCRIPTION  CASE COMMENTS STAINS USED IN DIAGNOSIS: H&E H&E *RECUT DEEPER X 9 LEVELS *RECUT DEEPER X 9 LEVELS *RECUT DEEPER X 9 LEVELS    CLINICAL HISTORY  SPECIMEN(S) OBTAINED 1. Sigmoid  Colon Poly p, Cold Snare 2. Sigmoid  Colon Polyp, Cbx  SPECIMEN COMMENTS: SPECIMEN CLINICAL INFORMATION: 1. Screening, colon polyps, diverticulosis    Gross Description 1. Received in formalin is a tan, soft tissue fragment that is submitted in toto.Size:  0.3 cm, 1 block submitted. 2. Received in formalin is a tan, soft tissue fragment that is submitted in toto.Size:  0.1 cm, 1 block submitted.mb 12-08-23        Report signed out from the following location(s) [facility] [address] [place], [place] [address] CLIA #: [id number]  [facility] [address] [place], [place] [address] CLIA #: [id number]       Assessment & Plan:   Problem List Items Addressed This Visit        Cardiovascular and Mediastinum   Essential hypertension - Primary     Other   Nicotine dependence, cigarettes, uncomplicated   Long-term current use of benzodiazepine   Hyperlipidemia   Generalized anxiety disorder   Other Visit Diagnoses       Flu vaccine need       Flu vaccine today, educated patient.   Relevant Orders   Flu vaccine HIGH DOSE PF(Fluzone Trivalent) (Completed)          Follow up plan: No follow-ups on file.

## 2023-12-31 NOTE — Assessment & Plan Note (Signed)
I have recommended complete cessation of tobacco use. I have discussed various options available for assistance with tobacco cessation including over the counter methods (Nicotine gum, patch and lozenges). We also discussed prescription options (Chantix, Nicotine Inhaler / Nasal Spray). The patient is not interested in pursuing any prescription tobacco cessation options at this time.  Does not want to obtain lung cancer screening.

## 2024-04-03 ENCOUNTER — Ambulatory Visit: Admitting: Nurse Practitioner

## 2024-05-09 ENCOUNTER — Ambulatory Visit: Admitting: Nurse Practitioner

## 2024-06-20 ENCOUNTER — Ambulatory Visit
